# Patient Record
Sex: Female | Born: 1955
Health system: Southern US, Community
[De-identification: ages and names within clinical notes are randomized; demographics above are authoritative.]

## PROBLEM LIST (undated history)

## (undated) DIAGNOSIS — I10 Essential (primary) hypertension: Secondary | ICD-10-CM

## (undated) DIAGNOSIS — K449 Diaphragmatic hernia without obstruction or gangrene: Secondary | ICD-10-CM

## (undated) DIAGNOSIS — K219 Gastro-esophageal reflux disease without esophagitis: Secondary | ICD-10-CM

## (undated) DIAGNOSIS — E119 Type 2 diabetes mellitus without complications: Secondary | ICD-10-CM

## (undated) DIAGNOSIS — N289 Disorder of kidney and ureter, unspecified: Secondary | ICD-10-CM

## (undated) DIAGNOSIS — E785 Hyperlipidemia, unspecified: Secondary | ICD-10-CM

## (undated) HISTORY — DX: Diaphragmatic hernia without obstruction or gangrene: K44.9

## (undated) HISTORY — PX: CHOLECYSTECTOMY: SHX55

## (undated) HISTORY — DX: Hyperlipidemia, unspecified: E78.5

---

## 2004-05-03 ENCOUNTER — Inpatient Hospital Stay (HOSPITAL_COMMUNITY): Admission: AD | Admit: 2004-05-03 | Discharge: 2004-05-19 | Payer: Self-pay | Admitting: *Deleted

## 2004-05-03 ENCOUNTER — Emergency Department (HOSPITAL_COMMUNITY): Admission: EM | Admit: 2004-05-03 | Discharge: 2004-05-03 | Payer: Self-pay | Admitting: Family Medicine

## 2004-06-21 ENCOUNTER — Encounter: Admission: RE | Admit: 2004-06-21 | Discharge: 2004-09-19 | Payer: Self-pay | Admitting: Family Medicine

## 2005-06-04 ENCOUNTER — Emergency Department (HOSPITAL_COMMUNITY): Admission: EM | Admit: 2005-06-04 | Discharge: 2005-06-04 | Payer: Self-pay | Admitting: Family Medicine

## 2005-11-29 ENCOUNTER — Ambulatory Visit (HOSPITAL_BASED_OUTPATIENT_CLINIC_OR_DEPARTMENT_OTHER): Admission: RE | Admit: 2005-11-29 | Discharge: 2005-11-29 | Payer: Self-pay | Admitting: Orthopedic Surgery

## 2005-11-29 ENCOUNTER — Encounter (INDEPENDENT_AMBULATORY_CARE_PROVIDER_SITE_OTHER): Payer: Self-pay | Admitting: Specialist

## 2006-05-22 ENCOUNTER — Encounter: Admission: RE | Admit: 2006-05-22 | Discharge: 2006-05-22 | Payer: Self-pay | Admitting: Orthopedic Surgery

## 2006-06-08 ENCOUNTER — Ambulatory Visit: Payer: Self-pay | Admitting: Internal Medicine

## 2006-06-08 ENCOUNTER — Encounter (INDEPENDENT_AMBULATORY_CARE_PROVIDER_SITE_OTHER): Payer: Self-pay | Admitting: Specialist

## 2006-06-09 ENCOUNTER — Inpatient Hospital Stay (HOSPITAL_COMMUNITY): Admission: RE | Admit: 2006-06-09 | Discharge: 2006-06-12 | Payer: Self-pay | Admitting: Orthopedic Surgery

## 2006-07-03 ENCOUNTER — Telehealth: Payer: Self-pay | Admitting: Infectious Diseases

## 2006-07-10 ENCOUNTER — Ambulatory Visit: Payer: Self-pay | Admitting: Infectious Diseases

## 2006-07-10 LAB — CONVERTED CEMR LAB
ALT: 11 units/L (ref 0–35)
AST: 12 units/L (ref 0–37)
Albumin: 4.2 g/dL (ref 3.5–5.2)
Alkaline Phosphatase: 46 units/L (ref 39–117)
BUN: 14 mg/dL (ref 6–23)
Basophils Absolute: 0 10*3/uL (ref 0.0–0.1)
Basophils Relative: 0 % (ref 0–1)
CO2: 23 meq/L (ref 19–32)
Calcium: 9.9 mg/dL (ref 8.4–10.5)
Chloride: 101 meq/L (ref 96–112)
Creatinine, Ser: 1.01 mg/dL (ref 0.40–1.20)
Eosinophils Absolute: 0.3 10*3/uL (ref 0.0–0.7)
Eosinophils Relative: 4 % (ref 0–5)
Glucose, Bld: 85 mg/dL (ref 70–99)
HCT: 36.9 % (ref 36.0–46.0)
Hemoglobin: 11.9 g/dL — ABNORMAL LOW (ref 12.0–15.0)
Lymphocytes Relative: 50 % — ABNORMAL HIGH (ref 12–46)
Lymphs Abs: 3.4 10*3/uL — ABNORMAL HIGH (ref 0.7–3.3)
MCHC: 32.2 g/dL (ref 30.0–36.0)
MCV: 85 fL (ref 78.0–100.0)
Monocytes Absolute: 0.5 10*3/uL (ref 0.2–0.7)
Monocytes Relative: 8 % (ref 3–11)
Neutro Abs: 2.6 10*3/uL (ref 1.7–7.7)
Neutrophils Relative %: 38 % — ABNORMAL LOW (ref 43–77)
Platelets: 378 10*3/uL (ref 150–400)
Potassium: 4.6 meq/L (ref 3.5–5.3)
RBC: 4.34 M/uL (ref 3.87–5.11)
RDW: 14.7 % — ABNORMAL HIGH (ref 11.5–14.0)
Sed Rate: 36 mm/hr — ABNORMAL HIGH (ref 0–22)
Sodium: 138 meq/L (ref 135–145)
Total Bilirubin: 0.2 mg/dL — ABNORMAL LOW (ref 0.3–1.2)
Total Protein: 7.9 g/dL (ref 6.0–8.3)
WBC: 6.8 10*3/uL (ref 4.0–10.5)

## 2006-07-26 ENCOUNTER — Encounter (INDEPENDENT_AMBULATORY_CARE_PROVIDER_SITE_OTHER): Payer: Self-pay | Admitting: Infectious Diseases

## 2006-08-17 ENCOUNTER — Encounter: Admission: RE | Admit: 2006-08-17 | Discharge: 2006-10-19 | Payer: Self-pay | Admitting: Orthopedic Surgery

## 2006-08-23 DIAGNOSIS — E119 Type 2 diabetes mellitus without complications: Secondary | ICD-10-CM | POA: Insufficient documentation

## 2006-08-23 DIAGNOSIS — I10 Essential (primary) hypertension: Secondary | ICD-10-CM | POA: Insufficient documentation

## 2006-08-23 DIAGNOSIS — T8450XA Infection and inflammatory reaction due to unspecified internal joint prosthesis, initial encounter: Secondary | ICD-10-CM | POA: Insufficient documentation

## 2006-08-27 ENCOUNTER — Ambulatory Visit: Payer: Self-pay | Admitting: Infectious Diseases

## 2006-09-10 ENCOUNTER — Other Ambulatory Visit: Admission: RE | Admit: 2006-09-10 | Discharge: 2006-09-10 | Payer: Self-pay | Admitting: Family Medicine

## 2006-09-11 ENCOUNTER — Encounter (INDEPENDENT_AMBULATORY_CARE_PROVIDER_SITE_OTHER): Payer: Self-pay | Admitting: Infectious Diseases

## 2006-09-12 ENCOUNTER — Encounter: Admission: RE | Admit: 2006-09-12 | Discharge: 2006-09-12 | Payer: Self-pay | Admitting: Dermatology

## 2006-09-25 ENCOUNTER — Encounter (INDEPENDENT_AMBULATORY_CARE_PROVIDER_SITE_OTHER): Payer: Self-pay | Admitting: Infectious Diseases

## 2006-10-10 ENCOUNTER — Ambulatory Visit: Payer: Self-pay | Admitting: Infectious Diseases

## 2006-10-10 LAB — CONVERTED CEMR LAB
ALT: 10 units/L (ref 0–35)
AST: 9 units/L (ref 0–37)
Albumin: 3.8 g/dL (ref 3.5–5.2)
Alkaline Phosphatase: 52 units/L (ref 39–117)
BUN: 16 mg/dL (ref 6–23)
CO2: 23 meq/L (ref 19–32)
Calcium: 9 mg/dL (ref 8.4–10.5)
Chloride: 105 meq/L (ref 96–112)
Creatinine, Ser: 0.93 mg/dL (ref 0.40–1.20)
Glucose, Bld: 109 mg/dL — ABNORMAL HIGH (ref 70–99)
Potassium: 4.4 meq/L (ref 3.5–5.3)
Sed Rate: 60 mm/hr — ABNORMAL HIGH (ref 0–22)
Sodium: 139 meq/L (ref 135–145)
Total Bilirubin: 0.2 mg/dL — ABNORMAL LOW (ref 0.3–1.2)
Total Protein: 7 g/dL (ref 6.0–8.3)

## 2006-10-17 ENCOUNTER — Ambulatory Visit: Payer: Self-pay | Admitting: Infectious Diseases

## 2006-10-30 HISTORY — PX: OTHER SURGICAL HISTORY: SHX169

## 2006-11-20 ENCOUNTER — Ambulatory Visit (HOSPITAL_COMMUNITY): Admission: RE | Admit: 2006-11-20 | Discharge: 2006-11-20 | Payer: Self-pay | Admitting: General Surgery

## 2007-01-21 ENCOUNTER — Ambulatory Visit: Payer: Self-pay | Admitting: Internal Medicine

## 2007-01-21 LAB — CONVERTED CEMR LAB
ALT: 11 units/L (ref 0–35)
AST: 13 units/L (ref 0–37)
Albumin: 4.3 g/dL (ref 3.5–5.2)
Alkaline Phosphatase: 48 units/L (ref 39–117)
BUN: 22 mg/dL (ref 6–23)
Basophils Absolute: 0 10*3/uL (ref 0.0–0.1)
Basophils Relative: 0 % (ref 0–1)
CO2: 23 meq/L (ref 19–32)
Calcium: 9.6 mg/dL (ref 8.4–10.5)
Chloride: 103 meq/L (ref 96–112)
Creatinine, Ser: 1.14 mg/dL (ref 0.40–1.20)
Eosinophils Absolute: 0.3 10*3/uL (ref 0.0–0.7)
Eosinophils Relative: 5 % (ref 0–5)
Glucose, Bld: 126 mg/dL — ABNORMAL HIGH (ref 70–99)
HCT: 36.7 % (ref 36.0–46.0)
Hemoglobin: 11.8 g/dL — ABNORMAL LOW (ref 12.0–15.0)
Lymphocytes Relative: 50 % — ABNORMAL HIGH (ref 12–46)
Lymphs Abs: 2.9 10*3/uL (ref 0.7–3.3)
MCHC: 32.2 g/dL (ref 30.0–36.0)
MCV: 86.8 fL (ref 78.0–100.0)
Monocytes Absolute: 0.5 10*3/uL (ref 0.2–0.7)
Monocytes Relative: 8 % (ref 3–11)
Neutro Abs: 2.2 10*3/uL (ref 1.7–7.7)
Neutrophils Relative %: 38 % — ABNORMAL LOW (ref 43–77)
Platelets: 334 10*3/uL (ref 150–400)
Potassium: 4.2 meq/L (ref 3.5–5.3)
RBC: 4.23 M/uL (ref 3.87–5.11)
RDW: 15.7 % — ABNORMAL HIGH (ref 11.5–14.0)
Sed Rate: 13 mm/hr (ref 0–22)
Sodium: 139 meq/L (ref 135–145)
Total Bilirubin: 0.2 mg/dL — ABNORMAL LOW (ref 0.3–1.2)
Total Protein: 7.5 g/dL (ref 6.0–8.3)
WBC: 5.9 10*3/uL (ref 4.0–10.5)

## 2007-02-05 ENCOUNTER — Ambulatory Visit: Payer: Self-pay | Admitting: Internal Medicine

## 2007-02-05 ENCOUNTER — Ambulatory Visit (HOSPITAL_COMMUNITY): Admission: RE | Admit: 2007-02-05 | Discharge: 2007-02-05 | Payer: Self-pay | Admitting: Internal Medicine

## 2007-03-14 ENCOUNTER — Ambulatory Visit: Payer: Self-pay | Admitting: Internal Medicine

## 2007-03-25 ENCOUNTER — Encounter: Admission: RE | Admit: 2007-03-25 | Discharge: 2007-05-01 | Payer: Self-pay | Admitting: Orthopedic Surgery

## 2007-05-15 ENCOUNTER — Encounter: Admission: RE | Admit: 2007-05-15 | Discharge: 2007-06-17 | Payer: Self-pay | Admitting: Neurology

## 2007-06-12 ENCOUNTER — Ambulatory Visit: Payer: Self-pay | Admitting: Internal Medicine

## 2007-06-12 LAB — CONVERTED CEMR LAB: Sed Rate: 13 mm/hr (ref 0–22)

## 2007-06-18 ENCOUNTER — Ambulatory Visit: Payer: Self-pay | Admitting: Internal Medicine

## 2007-06-30 HISTORY — PX: ORIF TIBIA PLATEAU: SHX2132

## 2007-07-06 ENCOUNTER — Inpatient Hospital Stay (HOSPITAL_COMMUNITY): Admission: EM | Admit: 2007-07-06 | Discharge: 2007-07-14 | Payer: Self-pay | Admitting: Emergency Medicine

## 2007-11-26 ENCOUNTER — Encounter: Admission: RE | Admit: 2007-11-26 | Discharge: 2008-02-12 | Payer: Self-pay | Admitting: Orthopedic Surgery

## 2008-01-07 ENCOUNTER — Encounter: Admission: RE | Admit: 2008-01-07 | Discharge: 2008-01-07 | Payer: Self-pay | Admitting: Family Medicine

## 2008-01-20 ENCOUNTER — Other Ambulatory Visit: Admission: RE | Admit: 2008-01-20 | Discharge: 2008-01-20 | Payer: Self-pay | Admitting: Family Medicine

## 2009-01-11 ENCOUNTER — Encounter: Admission: RE | Admit: 2009-01-11 | Discharge: 2009-01-11 | Payer: Self-pay | Admitting: Family Medicine

## 2009-01-18 ENCOUNTER — Other Ambulatory Visit: Admission: RE | Admit: 2009-01-18 | Discharge: 2009-01-18 | Payer: Self-pay | Admitting: Family Medicine

## 2010-01-10 ENCOUNTER — Emergency Department (HOSPITAL_COMMUNITY): Admission: EM | Admit: 2010-01-10 | Discharge: 2010-01-10 | Payer: Self-pay | Admitting: Emergency Medicine

## 2010-01-25 ENCOUNTER — Encounter: Admission: RE | Admit: 2010-01-25 | Discharge: 2010-01-25 | Payer: Self-pay | Admitting: Family Medicine

## 2010-02-14 ENCOUNTER — Encounter
Admission: RE | Admit: 2010-02-14 | Discharge: 2010-03-31 | Payer: Self-pay | Source: Home / Self Care | Admitting: Sports Medicine

## 2010-05-21 ENCOUNTER — Encounter: Payer: Self-pay | Admitting: *Deleted
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
## 2010-05-31 NOTE — Consult Note (Signed)
Summary: Consultation Report  Consultation Report   Imported By: Randon Goldsmith 10/23/2006 11:17:10  _____________________________________________________________________  External Attachment:    Type:   Image     Comment:   External Document

## 2010-05-31 NOTE — Progress Notes (Signed)
Summary: OV  OV   Imported By: Dorice Lamas 09/17/2006 12:42:16  _____________________________________________________________________  External Attachment:    Type:   Image     Comment:   External Document
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
## 2010-05-31 NOTE — Assessment & Plan Note (Signed)
Summary: FU OV/VS   Chief Complaint:  f/u R ankle infection.[name] Houston  History of Present Illness: [name] Houston continues to have some soreness in her ankle and is doing PT but has not had any new swelling, redness or pain. She continues on Keflex without difficulty.  Current Allergies (reviewed today): No known allergies       Vital Signs:  Patient Profile:   55 Years Old Female Weight:      211.1 pounds (95.95 kg) Temp:     97.4 degrees F (36.33 degrees C) oral Pulse rate:   74 / minute BP sitting:   130 / 80  (right arm)  Pt. in pain?   no              Is Patient Diabetic? Yes  Nutritional Status BMI of 25 - 29 = overweight  Does patient need assistance? Functional Status Self care Ambulation Normal     Physical Exam  Extremities:     Her R ankle has a stable scar laterally without any drainage or signs of active infection.    Impression & Recommendations:  Problem # 1:  INFECTION DUE TO INTERNAL JOINT PROSTHESIS (ICD-996.66) Her sed rate in September had normalized to 12. I suspect she is cured but the only real test of cure is to stop Keflex and see how she does. She agrees to stop and knows to call me if she has any new signs of recurrent infection. Orders: Est. Patient Level III (95284)    Patient Instructions: 1)  Please schedule a follow-up appointment in 2 months. 2)  Stop taking the antibiotic .    ]
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
## 2010-05-31 NOTE — Progress Notes (Signed)
  Phone Note Call from Patient   Reason for Call: Talk to Nurse Summary of Call: Has OV March 11 with Dr Ninetta Lights . She has wound vac present and wanted to know would we be replacing the dressing after the doctor finishes.   Advised we will apply a wet to dry dressing and the home health nurse will need to apply wound vacc. The [facility] is scheduled for OV the next morning. Initial call taken by: Tomasita Morrow RN,  July 03, 2006 2:26 PM

## 2010-05-31 NOTE — Progress Notes (Signed)
Summary: Initial OV  -  07/10/2006  Initial OV  -  07/10/2006   Imported By: Olena Leatherwood 07/12/2006 10:36:59  _____________________________________________________________________  External Attachment:    Type:   Image     Comment:   External Document

## 2010-05-31 NOTE — Assessment & Plan Note (Signed)
Summary: F/U FROM 07/10/06 VISIT/KDW   Chief Complaint:  f/u ov r lateral ankle .  Current Allergies: No known allergies     Risk Factors: Tobacco use:  current    Vital Signs:  Patient Profile:   55 Years Old Female Weight:      198.6 pounds (90.27 kg) Temp:     97 degrees F (36.11 degrees C) oral Pulse (ortho):   86 / minute BP sitting:   163 / 97  (left arm) Cuff size:   regular  Vitals Entered By: Tomasita Morrow RN (August 27, 2006 10:32 AM)             Is Patient Diabetic? No     Impression & Recommendations:  Problem # 1:  INFECTION DUE TO INTERNAL JOINT PROSTHESIS (WUJ-811.91) Assessment: Improved  Orders: Est. Patient Level II (47829)   Medications Added to Medication List This Visit: 1)  Keflex 500 Mg Caps (Cephalexin) .... One twice daily   Please see scanned dictation [phone]

## 2010-05-31 NOTE — Progress Notes (Signed)
Summary: Office Visit  Office Visit   Imported By: Randon Goldsmith 10/23/2006 10:35:53  _____________________________________________________________________  External Attachment:    Type:   Image     Comment:   External Document

## 2010-05-31 NOTE — Letter (Signed)
Summary: [facility], 07/26/2006  [facility], 07/26/2006   Imported By: Olena Leatherwood 08/02/2006 10:43:12  _____________________________________________________________________  External Attachment:    Type:   Image     Comment:   External Document

## 2010-05-31 NOTE — Assessment & Plan Note (Signed)
Summary: kdw   History of Present Illness: [name] Houston is in for her routine follow-up visit.  I saw her when she was hospitalized in February of this year.  Her current problem began in early 2007, when she slipped on some wet grass and sustained a closed right fibular fracture.  She underwent an uneventful surgery where internal fixation hardware was placed.  Postoperatively, she developed a draining sinus over her lateral malleolus.  Dr. Kristeen Miss took her back to surgery in August of 2007 and removed all hardware.  Operative cultures grew multiple organisms.  She was treated with Augmentin, but only took it for two weeks postoperatively and continued to have drainage.  She was readmitted to the hospital in February of this year.  Cultures again grew multiple organisms.  She was treated with Augmentin postoperatively, and took that until April 28 when my partner, Dr. Burnice Logan, switch her to Keflex.  She has been on Keflex ever since.  She has not had any further drainage, but continues to have some tenderness laterally over the ankle.  Her sed rate in March was 36.  It went up to 60 in June, but was down to normal at 13 on September 22.  Current Allergies: No known allergies     Risk Factors: Tobacco use:  current    Vital Signs:  Patient Profile:   55 Years Old Female Weight:      207.50 pounds Temp:     99.5 degrees F oral Pulse rate:   77 / minute BP sitting:   122 / 85             Is Patient Diabetic? Yes   Does patient need assistance? Functional Status Self care Ambulation Normal     Physical Exam  General:     alert and well-nourished.   Extremities:     there is a healed scar over the right lateral malleolus.  There is one small area of central scabbing.  There is no drainage, fluctuance, or other signs of active infection.    Impression & Recommendations:  Problem # 1:  INFECTION DUE TO INTERNAL JOINT PROSTHESIS (ICD-996.66) Her osteomyelitis  probably is cured at this point.  I will repeat plain x-rays and see her back in one month to consider stopping antibiotics. Orders: Est. Patient Level III (89381) Diagnostic X-Ray/Fluoroscopy (Diagnostic X-Ray/Flu)    Patient Instructions: 1)  Please schedule a follow-up appointment in 1 month.    ]

## 2010-05-31 NOTE — Letter (Signed)
Summary: [facility]-Dr. Madelon Lips  [facility]-Dr. Caffrey   Imported By: Dorice Lamas 10/02/2006 10:02:33  _____________________________________________________________________  External Attachment:    Type:   Image     Comment:   External Document

## 2010-09-13 NOTE — Discharge Summary (Signed)
NAME:  [name] Houston, [name] Houston NO.:  [id number]   MEDICAL RECORD NO.:  [id number]          PATIENT TYPE:  INP   LOCATION:  5031                         FACILITY:  [facility]   PHYSICIAN:  Eulas Post, MD    DATE OF BIRTH:  20-Feb-1956   DATE OF ADMISSION:  07/06/2007  DATE OF DISCHARGE:                               DISCHARGE SUMMARY   Date of anticipated discharge is July 14, 2007.   ADMISSION DIAGNOSIS:  Left knee pain.   DISCHARGE DIAGNOSES:  1. Left proximal bicondylar tibial plateau fracture.  2. Diabetes.  3. History of recent right ankle infection status post open reduction      and internal fixation requiring multiple surgical debridements and      wound vacuum-assisted closure.  4. Hypertension.  5. Hypercholesterolemia.  6. Acute Urinary Tract Infection   DISCHARGE MEDICATIONS:  1. Metformin 850 mg 2 tabs in the morning and 1 in the evening.  2. Aspirin 325 mg p.o. daily, after 4 weeks she can go back to her      baby aspirin.  3. Glimepiride 2 mg p.o. daily.  4. Hydrochlorothiazide 25 mg p.o. daily.  5. Lisinopril 20 mg p.o. b.i.d.  6. Meloxicam 7.5 mg p.o. daily.  7. Pravastatin 40 mg p.o. b.i.d.  8. Actos 30 mg p.o. daily.  9. Percocet 5/325 one to two tabs p.o. q.6 h. p.r.n. pain, dispensing      100 with no refills.  10.OxyContin ER 10 mg p.o. b.i.d., dispensing 60 with no refills.  11.Cipro 250 mg po b.i.d., dispensing 10 with no refills.   PRIMARY PROCEDURE:  Left proximal tibial bicondylar plateau open  reduction and internal fixation.   DISCHARGE INSTRUCTIONS:  She is to be nonweightbearing, and she is in a  cast that was applied on July 12, 2007.   HOSPITAL COURSE:  Mrs. [name] Houston is a 55 year old woman who fell  and had a bicondylar tibial plateau fracture.  She elected to undergo  the above-named procedures.  She tolerated the procedures well, and  postoperatively did not have any complications.  She was given aspirin  and  sequential compression devices for DVT prophylaxis.  We did not do  Coumadin or Lovenox for fear of getting a hematoma which would increase  her risk of infection.  She was already at high risk given her diabetes  and her history of a recent ankle ORIF infection.  She was also given  perioperative and preoperative vancomycin.  She is planned to be  discharged home on cipro for the treatment of her UTI.  She has cleared  physical therapy and her pain is controlled on p.o. analgesics.  Her  dressings  were changed on postoperative day 3, and her wounds were clean, dry, and  intact, and there was no evidence of infection.  She had xrays that  demonstrated appropriate position of the hardware and adequate reduction  of the articular surface.  She was placed in a long leg cast.  She will  have home health and physical therapy at home.      Eulas Post, MD  Electronically Signed     JPL/MEDQ  D:  07/12/2007  T:  07/12/2007  Job:  [id number]

## 2010-09-13 NOTE — Op Note (Signed)
NAME:  [name] Houston, [name] Houston             ACCOUNT NO.:  [id number]   MEDICAL RECORD NO.:  [id number]          PATIENT TYPE:  AMB   LOCATION:  DAY                          [facility]ACILITY:  [facility]   PHYSICIAN:  Adolph Pollack, M.D.DATE OF BIRTH:  05-03-1955   DATE OF PROCEDURE:  11/20/2006  DATE OF DISCHARGE:                               OPERATIVE REPORT   PREOPERATIVE DIAGNOSIS:  Bilateral breast abscesses.   POSTOPERATIVE DIAGNOSIS:  Bilateral breast abscesses.   PROCEDURE:  1. Complex incision and drainage of left breast abscess.  2. Complex incision and drainage of right breast abscess.   SURGEON:  Adolph Pollack, M.D.   ANESTHESIA:  General.   INDICATIONS:  [name] Houston is a 55 year old female who has been having  treatment for a chronic left breast infection/abscess for a number of  months now.  Because it was not resolving, she was sent to me for  evaluation.  When she came to the office, she also reported the acute  onset of the right breast abscess.  Both of these were in the medial  position.  I saw her in the office yesterday and now she is brought to  the operating room urgently for drainage under general anesthesia.  She  has been on chronic Keflex for an infection following ORIF of right  ankle fracture.   TECHNIQUE:  She is seen in the holding area and brought to the operating  room, placed supine on the operating table and general anesthetic was  administered.  The breasts were sterilely prepped and draped.  Beginning  on the left side and made elliptical incision medially about the 8 to 9  o'clock position of full-thickness skin and purulent drainage was  encountered and cultured.  I then debrided some of the underlying tissue  and controlled the bleeding with electrocautery.  I packed a dry sponge  in this wound.   I then approached the right side and isolated the fluctuant area  medially in the right breast and made an elliptical incision around this  and  drained a large amount of purulent material which was sent for  culture.  I debrided the skin and subcutaneous tissue sharply.  I  controlled bleeding with electrocautery.   I then packed both wounds with saline dampened gauze and a bulky dry  dressing was applied.   She tolerated the procedure well without any apparent complications and  was taken to recovery room in satisfactory condition.      Adolph Pollack, M.D.  Electronically Signed     TJR/MEDQ  D:  11/20/2006  T:  11/20/2006  Job:  [id number]

## 2010-09-13 NOTE — H&P (Signed)
NAME:  [name] Houston, [name] Houston NO.:  [id number]   MEDICAL RECORD NO.:  [id number]          PATIENT TYPE:  INP   LOCATION:  5031                         FACILITY:  [facility]   PHYSICIAN:  Eulas Post, MD    DATE OF BIRTH:  [birth year]   DATE OF ADMISSION:  07/06/2007  DATE OF DISCHARGE:                              HISTORY & PHYSICAL   CHIEF COMPLAINT:  Left knee pain.   HISTORY:  [name] is a 55 year old African American woman  with diabetes and tobacco abuse who recently had a right ankle infection  after an open reduction internal fixation who just finished her  antibiotics, and she was walking at her friend's driveway today when she  fell down onto her left side.  She had the acute onset of severe left  knee pain and was unable to walk.  She says bearing weight makes it  worse.  Pain medications make it better.  She noted acute onset swelling  and pain.  She rates the pain as a 9/10 with any kind of motion.   REVIEW OF SYSTEMS:  She denies any recent weight changes, fevers,  chills, cough, nausea or vomiting.  She denies any changes in her vision  or her hearing.  She has no changes in her bowel or bladder habits.  No  recent chest pain or shortness of breath.  She denies any  musculoskeletal problems with the exception of the right ankle and now  the left knee.  She denies easy bruising.  She denies psychiatric  problems.  She reports endocrine problems as above with the diabetes.  She reports recent infections and immune problems as above.  She denies  any recent rashes.   PAST MEDICAL HISTORY:  Significant for:  1. Diabetes.  2. Obesity.  3. Tobacco abuse.  4. Hypertension.   PAST SURGICAL HISTORY:  Significant for right ankle open reduction  internal fixation.  I believe there was a total of 3 operations,  ultimately, and including a wound vac in order to get the infection  cleared.   MEDICATIONS:  1. Metformin 850 mg, two in the morning and  one at night.  2. Aspirin 81 mg daily.  3. Glimepiride 2 mg p.o. daily.  4. Hydrochlorothiazide 25 mg p.o. daily.  5. Lisinopril 20 mg p.o. daily.  6. Meloxicam 7.5 mg p.o. daily.  7. Pravastatin 40 mg p.o. daily.  8. Actos 30 mg p.o. daily.   FAMILY HISTORY:  Her mother had diabetes.   SOCIAL HISTORY:  She does smoke.   EXAM:  Today, she is lying in bed in no acute distress.  VITAL SIGNS:  Show a temperature of 97.2, a blood pressure 161/88, a  pulse of 90 and respirations of 16.  NECK EXAM:  She has full range of motion with no radiculopathy.  She has  a midline trachea, no tenderness.  She has no masses.  CARDIOVASCULAR EXAM:  She has trace edema distally.  She has a regular  rate and rhythm.  PULMONARY EXAM:  She has no increase in respiratory efforts.  She has no  cyanosis.  ABDOMINAL EXAM:  She is moderately obese and her abdomen soft,  nontender, nondistended with no masses or organomegaly.  LYMPHATIC EXAM:  Her neck and axilla are without lymphadenopathy.  PSYCHIATRIC EXAM:  Her mood and affect are normal and her judgment and  insight are intact.  She is competent for consent.  NEUROLOGIC EXAM:  She has sensation intact distally in her lower  extremities.  Deep tendon reflex on the right knee is intact.  Could not  be tested on the left knee due to pain.  MUSCULOSKELETAL EXAM:  She is unable to walk.  She, therefore, has an  abnormal gait.  She has normal nails and digits.  RIGHT LOWER EXTREMITY:  Has a well-healed surgical scar that is somewhat  abnormal on the right ankle.  There is no erythema or evidence of  infection.  There is decreased range of motion, but it appears to be  functional.  The ankle feels stable.  She has 5/5 strength in her EHL  and FHL.  The left lower extremity has significant swelling around the  knee.  She has a positive effusion.  She has pain to palpation  proximally along the tibia.  Her range of motion at her knee is unable  to be assessed  due to pain.  The knee is unstable due to her fracture.  Her strength also is intact at her ankle, but cannot be assessed at her  knee.  I have reviewed x-rays dated October 7, of her right ankle, which  demonstrate a widened syndesmosis and abnormal tibiotalar congruency in  the absence of any hardware.  I have also reviewed x-rays of her left  knee dated July 06, 2007, which demonstrated displaced lateral tibial  plateau fracture.  I have also reviewed her CT scan which demonstrates  comminution and lateral tibial plateau fracture with some extension to  the medial side, however, the majority of the injury is on the lateral  side and the medial side appears to be congruent with the femoral  condyle while the lateral side has depression.   IMPRESSION:  1. Left depressed lateral tibial plateau fracture that does extend      medially.  2. Tobacco abuse.  3. Diabetes mellitus.  4. Obesity.  5. History of recent ankle infection status post open reduction      internal fixation.   PLAN:  We are going to admit her for preoperative IV antibiotics as well  as preoperative optimization for an ORIF of her left lateral tibial  plateau.  The risks, benefits and alternatives to operative intervention  were discussed with her including, but not limited to, the risks of  infection, bleeding, nerve injury, cardiopulmonary complications, blood  clots, malunion, nonunion, posttraumatic stiffness and arthritis,  disease transmission, the use of allograft bone, among others, and she  is willing to proceed.  We are going to plan to manage her pain with IV  and p.o. pain medicines and give her preoperative vancomycin given her  history of infection and ongoing inpatient medical treatment.  She is at  fairly high risk for poor outcome, but we will do our best to manage her  in the best way possible.      Eulas Post, MD  Electronically Signed     JPL/MEDQ  D:  07/06/2007  T:  07/06/2007  Job:   [id number]

## 2010-09-13 NOTE — Discharge Summary (Signed)
NAME:  STEFHANIE, KACHMAR NO.:  [id number]   MEDICAL RECORD NO.:  [id number]          PATIENT TYPE:  INP   LOCATION:  5031                         FACILITY:  [facility]   PHYSICIAN:  Eulas Post, MD    DATE OF BIRTH:  Sep 25, 1955   DATE OF ADMISSION:  07/06/2007  DATE OF DISCHARGE:  07/14/2007                               DISCHARGE SUMMARY   ADDENDUM:   ADDITIONAL DISCHARGE DIAGNOSIS:  Urinary tract infection.   HOSPITAL COURSE:  Since the previous discharge summary dictated on July 12, 2007, she had some difficulty with physical therapy and remained in  the hospital for an additional two days. During that time, her  urinalysis came back and grew out greater than 100,000 colonies of  Citrobacter Koseri that was sensitive to Ciprofloxacin and less  sensitive to Cephazolin. On exam the day of discharge she says that she  had no dysuria and no fevers or chills, and she was moving her toes. She  cast was intact. We are going to plan discharge for home with  Ciprofloxacin rather than Keflex and plan to see her back in two weeks.      Eulas Post, MD  Electronically Signed     JPL/MEDQ  D:  07/14/2007  T:  07/14/2007  Job:  [id number]

## 2010-09-13 NOTE — Op Note (Signed)
NAME:  ODETTA, FORNESS NO.:  [id number]   MEDICAL RECORD NO.:  [id number]          PATIENT TYPE:  INP   LOCATION:  5031                         FACILITY:  [facility]   PHYSICIAN:  Eulas Post, MD    DATE OF BIRTH:  [birth year]   DATE OF PROCEDURE:  07/07/2007  DATE OF DISCHARGE:                               OPERATIVE REPORT   PREOPERATIVE DIAGNOSIS:  Left proximal tibia medial and lateral plateau  fracture.   POSTOPERATIVE DIAGNOSIS:  Left proximal tibia medial and lateral plateau  fracture.   OPERATIVE PROCEDURE:  Open reduction internal fixation of the left  medial and lateral proximal tibial plateaus.   ANESTHESIA:  General.   ESTIMATED BLOOD LOSS:  200 mL.   TOURNIQUET TIME:  0 minute.   ANTIBIOTICS:  Vancomycin given preoperatively.   OPERATIVE IMPLANT:  Synthes 3.5 mm proximal tibial plate with a single  non locking screw distally and multiple locking screws proximally.   PREOPERATIVE INDICATIONS:  Mrs. Manuela Halbur is a 55 year old  diabetic woman who has a recent history of an ankle fracture that was  complicated by postoperative infection who finally got over her ankle  infection and one month later twisted and fell on her left knee.  She  had the acute onset of pain, unable to bear weight.  She had x-rays and  CT scans which demonstrated a medial and lateral proximal tibial plateau  fracture.  She elected to undergo the above named procedures.  The  risks, benefits and alternatives to operative intervention were  discussed with her preoperatively including but not limited to the risks  of infection, bleeding, nerve injury, disease transmission from the use  of allograft bone, hardware prominence, hardware failure, malunion, post-  traumatic arthritis, knee stiffness, loss of function, loss of motion,  cardiopulmonary complications, blood clots, among others and she is  willing to proceed.   OPERATIVE FINDINGS:  The medial plateau was  impacted in slight varus.  We were able to elevate it from the lateral side using a transosseous  approach using a Cobb elevator.  The lateral plateau was split and there  was an anterior portion of it that was basically crushed.  We were able  to elevate the lateral plateau and replaced the bone loss with allograft  bone.  We also placed two Ethibond stitches through the lateral meniscus  and secured it back to the plate.   OPERATIVE PROCEDURE:  The patient is brought to the operating room,  placed in supine position.  General anesthesia was administered.  Vancomycin had been given preoperatively.  The left lower extremity was  prepped and draped in the usual sterile fashion.  Bump was placed under  the hip.  Incision was made over the lateral aspect of the proximal  tibia and the proximal tibia was exposed.  Care was taken to protect the  lateral collateral ligament and the peroneal nerve.  Iliotibial band was  incised.  The joint was incised underneath the lateral meniscus.  Stitches were placed in the meniscus and used as retractors and the  meniscus was elevated.  The joint  surface was directly visualized.  There was significant joint depression and some comminution anteriorly.  We then elevated the fracture on the medial side from the lateral side  using a Cobb elevator.  There was a reasonably large amount of bone  loss.  We elevated the lateral side as well.  We then progressively  tamped in the allograft bone to support the plateau.  Once satisfactory  bone graft had been placed, we then reduced the fracture anatomically  along the lateral cortex and used a [employer]  clamp to secure the  fracture.  We then held it in place with a K-wire placed a 3.5 mm plate  laterally.  Anatomic fixation and realignment was achieved.  The very  distal most portion of the plate stood anterior to the tibial crest by  just a very small amount.  Satisfactory alignment was achieved so we  irrigated  the wounds copiously.  We placed the proximal rafters screws  into position.  These were placed in such a way such that when we  brought the plate into the bone distally it would correct the slight  varus deformity that was residual on the medial fracture.  We were able  to correct this varus deformity well and secure the plate distally with  a nonlocking screw followed by two locking screws and an oblique screw  going up towards the subchondral bone.  Excellent fixation was achieved.  The knee was taken through range of motion and was found to be stable.  We secured the Ethibond sutures from the meniscus through the plate and  irrigated the wounds copiously.  The tissue of the iliotibial band was  closed over the plate as was the tibialis anterior fascia.  We then  closed the subcutaneous tissue with 2-0 followed by 4-0 Monocryl for the  skin.  Steri-Strips were placed followed by sterile gauze and a knee  immobilizer.  Marcaine was injected.  The patient was awakened and  returned to PACU in stable satisfactory condition.  There were no  complications.  The patient tolerated the procedure well.      Eulas Post, MD  Electronically Signed     JPL/MEDQ  D:  07/07/2007  T:  07/08/2007  Job:  [id number]

## 2010-09-13 NOTE — Consult Note (Signed)
NAME:  [name] Houston, [name] Houston NO.:  [id number]   MEDICAL RECORD NO.:  [id number]          PATIENT TYPE:  INP   LOCATION:  5031                         FACILITY:  [facility]   PHYSICIAN:  Hollice Espy, M.D.DATE OF BIRTH:  08/01/1955   DATE OF CONSULTATION:  07/06/2007  DATE OF DISCHARGE:                                 CONSULTATION   PRIMARY CARE PHYSICIAN:  Stacie Acres. White, M.D.   CHIEF COMPLAINT:  Fall with left leg pain.   HISTORY OF PRESENT ILLNESS:  Patient is a 55 year old African-American  female with a past medical history of obesity, hypertension, diabetes  mellitus, and tobacco abuse as well as she has had three previous ankle  fracture repairs, who presents to the emergency room after a fall,  leading to a comminuted proximal left tibial fracture in two areas.  Orthopedic surgery was asked to see and admit the patient, and [facility]  Hospitalists were consulted for medical management.  When I saw the  patient, she had been transported up to the hospital floor.  She was  doing okay.  She complained of some mild leg pain but denied any  headaches, vision changes, dysphagia, chest pain, palpitations,  shortness of breath, wheezing, or coughing.  No abdominal pain, no  hematuria, dysuria, constipation, or diarrhea.  No focal extremity  numbness, weakness, or pain other than in her left thigh.  Her review of  systems is otherwise negative.   Patient tells me at her baseline, she is normally able to ambulate well.  Does not have any shortness of breath or chest pain.  Has unrestricted  activity.   Her past medical history includes diabetes mellitus, which she says is  well controlled.  Hypertension.  She has a history of right ankle  fracture repair with secondary arthritis.  History of tobacco abuse.  She also has a history of breast abscess, status post drainage.   MEDICATIONS:  1. Metformin 850 2 tabs in the morning, 1 tablet in the evening.  2. Glyburide 2  tablets p.o. daily.  3. Pravastatin 80 mg p.o. daily.  4. HCTZ 25 p.o. daily.  5. Lisinopril 20 mg p.o. b.i.d.  6. Meloxicam 7.5 p.o. daily.  7. She checks her sugars on a regular basis.  8. She takes Actos 30 mg p.o. daily.   Patient has no known drug allergies.   SOCIAL HISTORY:  She smokes about three cigarettes a day.  Denies any  heavy alcohol or drug use.   FAMILY HISTORY:  Noncontributory.   PHYSICAL EXAMINATION:  VITALS ON ADMISSION:  Temp 97, heart rate 161/88,  respirations 16, O2 sat 98% on room air, pulse 90.  GENERAL:  She is alert and oriented x3 in some mild distress secondary  to leg pain.  HEENT:  Normocephalic and atraumatic.  Mucous membranes are moist.  She has no carotid bruits.  HEART:  Regular rate and rhythm.  S1 and S2.  LUNGS:  Clear to auscultation bilaterally.  ABDOMEN:  Soft, obese, nontender, positive bowel sounds.  I defer to musculoskeletal exam secondary to her injury.  EXTREMITIES:  No clubbing, cyanosis  or edema.   LAB WORK:  White count is 13.5, H&H 11.1 and 34, MCV 88, platelet count  333, 77% neutrophils.  Sodium 137, potassium 2.6, chloride 101, bicarb  21, BUN 13, creatinine 1.05, glucose 129.  LFTs are unremarkable.   EKG shows a normal sinus rhythm.   Full review of her complete knee showed a comminuted proximal left  tibial fracture present involving both medial and lateral tibial  plateaus, large joint effusion.  No fibular or femoral fracture.   ASSESSMENT/PLAN:  1. Status post fall with comminuted tibial fracture.  Patient is okay      for the operating room.  Despite her risks, her diabetes and      hypertension, she appears to otherwise be of low cardiac risk and      should do well.  2. Hypertension:  Would refocus on pain control while she will be      n.p.o.  Will put her on an IV beta blocker.  3. Diabetes mellitus:  Will change the sliding scale for now, covering      her while she is going to the operating room.  4.  History of tobacco abuse:  She has declined a nicotine patch.  5. Obesity.      Hollice Espy, M.D.  Electronically Signed     SKK/MEDQ  D:  07/06/2007  T:  07/06/2007  Job:  [id number]   cc:   Eulas Post, MD  Stacie Acres Cliffton Asters, M.D.

## 2010-09-20 NOTE — H&P (Signed)
NAME:  [name] Houston, [name] Houston NO.:  [id number]   MEDICAL RECORD NO.:  [id number]          PATIENT TYPE:  INP   LOCATION:  5727                         FACILITY:  [facility]   PHYSICIAN:  Theone Stanley, MD   DATE OF BIRTH:  24-Aug-1955   DATE OF ADMISSION:  05/03/2004  DATE OF DISCHARGE:                                HISTORY & PHYSICAL   HISTORY OF PRESENT ILLNESS:  [name] Houston is a 55 year old African American  female, who has no primary care physician, presenting to urgent care with  complaints of right breast pain.  Apparently the patient had what appeared  to be a boil or pimple about a week ago on her right breast.  It was  itching.  She started to itch it and put some lotion on it.  Over the past  two days, it has gotten much worse with a lot more pain, more indurated and  more erythematous.  She is a non-nursing female.  Also, after presenting to  the urgent care, it was noted that she had elevated sugars in the 200s with  polyuria and polydipsia over the past two to three days.   PAST MEDICAL HISTORY:  None.   MEDICATIONS:  Aleve on a p.r.n. basis.   ALLERGIES:  None.   FAMILY HISTORY:  Paternal grandmother, father and mother all have diabetes.  Mother has hypertension.  Her father actually passed away at age 77 from  prostate cancer and PE.   SOCIAL HISTORY:  [place]  She has one older child.  She smokes about two to three  cigarettes a day.  She drinks about two to three cups of hard liquor on the  weekends.  No illicit drug use.   REVIEW OF SYSTEMS:  The review of systems besides her current right breast  pain is negative.  She denies any chest pain, shortness of breath, cough,  dizziness, lightheadedness, headaches, change in bowel habits __________  No  hematuria or dysuria.  No other skin changes.   PHYSICAL EXAMINATION:  VITAL SIGNS:  Temperature 100.5 degrees, heart rate  96, respiratory rate  18, blood pressure 180/99, saturating 100% on room air.  HEENT:  Head:  Normocephalic and atraumatic.  Eyes:  __________ Pupils equal  and reactive to light.  Extraocular movements intact.  Ears:  No discharge.  Throat:  Clear.  No erythema.  No exudate.  Mucosal moist.  NECK:  Supple.  No lymphadenopathy.  HEART:  Regular rate and rhythm.  No murmurs, rubs or gallops appreciated.  BREASTS:  Exam revealed a indurated, erythematous, quite swollen right  breast mainly on the anterior aspect of the right breast.  She did have a  small amount of skin breakdown in the 6 o'clock position, but no pus or  other fluids were present.  EXTREMITIES:  No cyanosis, clubbing or edema.   LABORATORIES:  White count of 14, hemoglobin of 13, hematocrit of 40,  platelets of 431.  Sodium at 133, potassium at 3.9, chloride at 98, CO2 at  26, glucose at 273, BUN  at 9, creatinine at 0.7, total bilirubin at 0.4,  alkaline phosphatase at 92, AST at 13, ALT at 13, total protein at 8.6,  albumin at 3.1, calcium at 9.3.  A urinalysis showed a specific gravity of  1.015, pH of 6.5, glucose of 500, negative bilirubin, ketones at 40, a large  amount of blood, 30 protein, urobilinuria at 1 and trace leukocytes.   ASSESSMENT:  [name] Houston is a 55 year old African American female  presenting with new onset diabetes and right breast mastitis.   IMPRESSION AND PLAN:  1.  Right breast mastitis.  At this point in time it is unclear whether she      has an abscess present.  Will obtain an ultrasound of the breast      tomorrow and see if we can possibly aspirate and/or consult surgery at      that point in time.  In the meantime, I will start her on Unasyn for      broad coverage and cold compresses or hot compresses for pain relief.  2.  New onset diabetes.  Since the patient has not been to a physician, it      is unclear how long the patient has had it.  Will obtain a hemoglobin      A1C to see whether this is  longstanding.  Will start her on subcutaneous      insulin and once the hemoglobin A1C is back consider starting oral      hypoglycemic medication.  3.  Hypertension.  She most likely has hypertension, although pain might be      complicating the issue.  Will start her on an ACE inhibitor at this      point in time to help out with that.  4.  Prophylaxis.  We will start her on subcutaneous Lovenox and consider H2      or PPI.  However, since she is eating will hold off on that.      Atta   AEJ/MEDQ  D:  05/03/2004  T:  05/03/2004  Job:  [id number]

## 2010-09-20 NOTE — Consult Note (Signed)
NAME:  [name] Houston, [name] Houston             ACCOUNT NO.:  [id number]   MEDICAL RECORD NO.:  [id number]          PATIENT TYPE:  INP   LOCATION:  5727                         FACILITY:  [facility]   PHYSICIAN:  Adolph Pollack, M.D.DATE OF BIRTH:  [birth year]   DATE OF CONSULTATION:  05/13/2004  DATE OF DISCHARGE:                                   CONSULTATION   REQUESTING PHYSICIAN:  Corinna L. Lendell Caprice, MD.   REASON FOR CONSULTATION:  Mastitis.   HISTORY OF PRESENT ILLNESS:  Ms. Wiatrek is a 55 year old female who was  admitted back May 03, 2004. At that time she was noted to have fairly  significant right breast mastitis.  She also had a significant hyperglycemia  and hypertension.  She has thus been diagnosed with new onset diabetes and  hypertension.  They have obtained ultrasounds seeing if there is anything to  drain in the breast and they have been negative; however, the mastitis has  persisted and now she has purulent drainage coming from the right breast and  we were thus asked to see her.  She has been on intravenous antibiotics.   PAST MEDICAL HISTORY:  Recently diagnosed diabetes mellitus, hypertension,  gallbladder disease.   PAST SURGICAL HISTORY:  Cholecystectomy.   ALLERGIES:  No known drug allergies.   CURRENT MEDICATIONS:  Insulin, glucophage, Lovenox, Lisinopril, vancomycin,  Flagyl, morphine, Vicodin p.r.n.   SOCIAL HISTORY:  She is a smoker, has weekend alcohol use.  No drug use.   FAMILY HISTORY:  No breast disease in the family.  Mother has hypertension,  father died of prostate cancer.   REVIEW OF SYSTEMS:  CARDIOVASCULAR:  No known heart disease.  PULMONARY:  No  chronic lung disease, asthma, pneumonia.        NEUROLOGIC:  No seizure  disorder.  HEMATOLOGIC:  No known bleeding disorders or anemia.  ENDOCRINE:  No thyroid disease.  Recently diagnosed diabetes.  GI:  No hiatal hernia, no  reflux, no hepatitis or jaundice.  No change in stool pattern.  GU:   No  kidney stones.   PHYSICAL EXAMINATION:  GENERAL:  An obese female who is in no acute  distress, pleasant and cooperative.  VITAL SIGNS:  She is afebrile with a temperature of 98.7.  Blood pressure is  161/93.  Pulse 74, respiratory rate is 18.  NECK:  Supple, no thyromegaly.  CHEST:  Breath sounds equal.  CARDIOVASCULAR:  Regular rate and rhythm, no murmur, no carotid bruits.  ABDOMEN:  Soft, obese, nontender, no masses.  BREASTS:  Right breast is erythematous, indurated and swollen, mostly from a  12 o'clock to 6 o'clock position with a fluctuant area at approximately the  5 o'clock position with some purulent drainage from it.  It is twice the  size of the left breast.  EXTREMITIES:  Lower extremities have 1 to 2+ edema bilaterally.   LABORATORY DATA:  White blood cell count May 10, 2004 was 9600, blood  cultures negative.   IMPRESSION:  Right breast abscess with significant surrounding cellulitis.   Also has newly diagnosed diabetes and hypertension.   PLAN:  Will  take her to the operating room for incision and drainage this  afternoon.  I discussed procedure, rationale and risks.  The risks include  but are not limited to bleeding breast deformity.  Recurrent infection and  risks of anesthesia.  She seemed to understand these and is agreeable to  proceed.  We will hold her Lovenox and make her n.p.o.      Tish Men  D:  05/13/2004  T:  05/13/2004  Job:  [id number]

## 2010-09-20 NOTE — Op Note (Signed)
NAME:  [name] Houston, [name] Houston NO.:  [id number]   MEDICAL RECORD NO.:  [id number]          PATIENT TYPE:  AMB   LOCATION:  DSC                          FACILITY:  [facility]   PHYSICIAN:  Dyke Brackett, M.D.    DATE OF BIRTH:  23-Jan-1956   DATE OF PROCEDURE:  11/29/2005  DATE OF DISCHARGE:                                 OPERATIVE REPORT   SURGEON:  Dyke Brackett, M.D.   PREOPERATIVE DIAGNOSIS:  Retained hardware, right ankle, rule out infection.   POSTOPERATIVE DIAGNOSIS:  Retained hardware, right ankle, rule out  infection.   OPERATION:  1. Incision and debridement, right ankle.  2. Removal of hardware, right ankle.   ANESTHESIA:  General with local supplementation.   INDICATIONS:  The patient is a 55 year old female, who had ORIF of her  ankle, uneventful.  She developed a problem with her lateral wound many  weeks after surgery, with recurrent mastitis being noted as well, possible  seeding of the hardware, thought to be amenable with the fracture having  healed, the hardware removal, bone biopsy, culture and I&D as an outpatient.   DESCRIPTION OF PROCEDURE:  Sterile prep and drape.  After placing the  tourniquet at 350, incision was made over the lateral plate with the  draining sinus noted.  This sinus was probably in the midportion of the  wound and measured about 0.5 cm.  It was not clear that it definitely  communicated deeply with the bone, but there was some serous drainage around  the plate, possibly more than would be thought typical.  This was cultured  aerobically and anaerobically, although no gross purulence was seen.  The  holes, once the plate and the screws had been removed, were curetted and  sent for bone biopsy.  Copious irrigation was carried out.  Closure was  effected with limited 2-0 away from the sinus site with staples, and then  one stitch was placed widely over the sinus to effect some closure of it  after the edges were debrided of the  sinus tract.  A very bulky dressing was  applied.  Taken to the recovery room in stable condition.  The tourniquet  was released after application of the dressing.  Marcaine without  epinephrine infiltrated into the wound.  Lightly compressive sterile  dressing applied with a splint.  Taken to the recovery room in stable  condition.      Dyke Brackett, M.D.  Electronically Signed     WDC/MEDQ  D:  11/29/2005  T:  11/29/2005  Job:  [id number]

## 2010-09-20 NOTE — Op Note (Signed)
NAME:  [name] Houston, [name] Houston             ACCOUNT NO.:  [id number]   MEDICAL RECORD NO.:  [id number]          PATIENT TYPE:  AMB   LOCATION:  SDS                          FACILITY:  [facility]   PHYSICIAN:  Dyke Brackett, M.D.    DATE OF BIRTH:  Aug 11, 1955   DATE OF PROCEDURE:  06/08/2006  DATE OF DISCHARGE:                               OPERATIVE REPORT   PREOPERATIVE DIAGNOSIS:  Probable osteomyelitis, distal 1/3 fibular  shaft.   POSTOPERATIVE DIAGNOSIS:  Probable osteomyelitis, distal 1/3 fibular  shaft.   OPERATION:  1. Irrigation and debridement of osteomyelitis and soft tissue.  2. Application of VAC.   SURGEON:  Dyke Brackett, M.D.   ASSISTANT:  Richardean Canal, P.A.   ANESTHESIA:  General.   TOURNIQUET TIME:  Approximately 40 minutes.   INDICATIONS:  The patient is a 55 year old diabetic, who had uneventful  ankle surgery, healed her fracture, developed a possible wound infection  or a wound pressure sore leading to some nonhealing.  The fracture  healed, but there was persistent inability to heal the wound completely,  leaving the hardware removed.  When cultured, multiple cultures were  negative, although she had been placed on antibiotics periodically due  to the wound drainage.  And due to the fact that once the hardware was  removed, the wound failed to heal completely, she had a bone scan, which  suggested osteomyelitis.  It was then thought that she needed inpatient  care with more aggressive debridement and application of a VAC, and this  could be accomplished as an outpatient.   DESCRIPTION OF PROCEDURE:  The patient's open wound probably measured  about 3 cm x 1.5 cm.  It was excised.  There was some exposed peroneal  tendon in the posterior half.  The bone was somewhat mushy.  No gross  abscess was appreciated, but cultures of the soft tissue and bone were  sent, as well as bone biopsy.  The medullary canal was debrided over a  distance of probably 2 to about 3 cm,  and soft tissue debridement  carried out as well.  Copious irrigation with pulsatile lavage, 3000 mL,  was placed, followed by application of a VAC, a sterile wrap and a  posterior splint.  The tourniquet was released.  Taken to the recovery  room in stable condition.      Dyke Brackett, M.D.  Electronically Signed     WDC/MEDQ  D:  06/08/2006  T:  06/08/2006  Job:  [id number]

## 2010-09-20 NOTE — Op Note (Signed)
NAME:  [name], [name] Houston             ACCOUNT NO.:  [id number]   MEDICAL RECORD NO.:  [id number]          PATIENT TYPE:  INP   LOCATION:  5727                         FACILITY:  [facility]   PHYSICIAN:  Adolph Pollack, M.D.DATE OF BIRTH:  [birth year]   DATE OF PROCEDURE:  05/13/2004  DATE OF DISCHARGE:                                 OPERATIVE REPORT   PREOPERATIVE DIAGNOSIS:  Right breast abscess.   POSTOPERATIVE DIAGNOSIS:  Right breast abscess.   OPERATION/PROCEDURE:  Complex incision and drainage of right breast abscess.   SURGEON:  Adolph Pollack, M.D.   ANESTHESIA:  General.   INDICATIONS:  [name] is a 55 year old recently diagnosed diabetic who  has had problems with right breast mastitis.  She states this started around  Thanksgiving.  On examination she has a very firm erythematous breast, with  two areas of fluctuance; one at the 5 o'clock position and one at the 2  o'clock position.  She now presents for drainage.   DESCRIPTION OF OPERATION:  The patient was seen in the holding area and the  right chest wall was marked.  She was then brought to the operating room,  placed supine on the operating table and general anesthetic was  administered.  The right breast was sterilely prepped and draped.  At the 5  o'clock position and incision was made in the fluctuant area and copious  amounts of purulent material evacuated.  Some was sent for culture.  It was  a deep cavity and it projected laterally, inferiorly and medially.  It also  went superiorly.  At the 2 o'clock position I incised the fluctuant area  there and more purulent material was noted.  The two cavities just did  connect.  There were some superior loculations, which I broke up with my  finger.  This was a ery complex type abscess.  I continued to break up  loculations bluntly.   I then irrigated the wound copiously with saline solution.  Hemostasis was  obtained with the cautery.  I then packed the  wound with two separate Kerlix  soaked gauze.  A bulky dressing was applied.   The patient tolerated the procedure well without any apparent complications.  She was taken to the recovery room in satisfactory condition.  She will need  to have dressing changes started I about 48 hours and my need a dressing  change in the operating room depending on how well she tolerates the  dressing removal.      TJR/MEDQ  D:  05/13/2004  T:  05/14/2004  Job:  [id number]

## 2010-09-20 NOTE — Discharge Summary (Signed)
NAME:  [name] Houston, [name] Houston             ACCOUNT NO.:  [id number]   MEDICAL RECORD NO.:  [id number]          PATIENT TYPE:  INP   LOCATION:  5727                         FACILITY:  [facility]   PHYSICIAN:  Hollice Espy, M.D.DATE OF BIRTH:  1956/04/07   DATE OF ADMISSION:  05/03/2004  DATE OF DISCHARGE:  05/19/2004                                 DISCHARGE SUMMARY   CONSULTANTS:  Dr. Avel Peace, surgery.   DISCHARGE DIAGNOSES:  1.  Mastitis, status post debridement of right breast abscess.  2.  Cellulitis of the right breast.  3.  Newly diagnosed diabetes.  4.  Newly diagnosed hypertension.  5.  Obesity.   DISCHARGE MEDICATIONS:  1.  Glucophage 500 mg one p.o. b.i.d.  2.  Amaryl 2 mg one p.o. daily.  3.  Lasix 20 mg daily.  4.  Prinivil 20 mg p.o. b.i.d.  5.  Hydrochlorothiazide 25 mg p.o. daily.  6.  Augmentin 875 mg two times a day for six more days.  7.  Benadryl 25 p.o. three times a day.  8.  Micro-Guard powder applied topically two times a day to buttocks and      thighs for ten days.  9.  The patient is also being discharged on Percocet 5/325 q. 6 hours p.r.n.      pain.   Her activity will be as tolerated.  She will be discharged on a low sodium  diabetic diet.  Her wound care will be addressed as per home health.  She  will follow-up with Dr. Abbey Chatters in one week from [facility].  She  has also been given a number to establish with a primary care doctor.   HOSPITAL COURSE:  The patient is a 55 year old African-American female who  for one week has been having problems with a right breast area of itching  which started to have a rash as well.  She felt for the last two days it was  getting much worse.  She had just been recently diagnosed with diabetes just  within the last week and had been having episodes of polyuria and  polydipsia.  She presented to the emergency room and was found to have a  questionable mastitis, questionable abscess.  The patient was  started on IV  antibiotics and admitted for further evaluation.  In addition her blood  pressure was found to be elevated with systolic blood pressure in the 150's.  The patient was started on an ACE inhibitor.  She was also started on  sliding scale for her insulin and an oral agent was added.  For mastitis and  abscess, the patient was started on fine needle aspiration.  Specimen was  attempted to be sent for culture, however, not enough sample could be drawn.  The patient was started on Unasyn and Flagyl one day right after the patient  continued to have fevers.  The patient despite these broad coverage  antibiotics continued to have fevers and surgery was consulted for a second  opinion after it was found on May 09, 2004, a second ultrasound was  attempted, the patient had a loculated  area noted.  At this time surgery was  notified and evaluated the patient and felt that she would need surgery.  She underwent an incision and debridement on May 15, 2004.  She  tolerated this well.  Postop she was sore but doing better.  She was  continued on IV antibiotics.  Cultures were found to be negative for MRSA.  The patient's antibiotics were changed over to p.o. on May 18, 2004.  She was recommended to be continued on seven more days of p.o. antibiotics.  In regard to her diabetes mellitus, the patient was placed on a sliding  scale.  Initially she received no significant medications when first  diagnosed with diabetes.  While here in the hospital, she was started on  p.o. Glucophage and Amaryl.  She tolerated this well.  Her blood sugars  stayed stable.  In regards to her hypertension, she was started on Lasix,  Prinivil and Hydrochlorothiazide.  Previously she had not been on any  medications.  Home health was set up to follow-up with the patient in  helping with her medicines as well as helping her with dressing changes.  She will follow-up with surgery in one weeks time.  In  addition the patient  has no PCP to help manage her blood pressure, diabetes or these other  illnesses.  A number had been given for the patient to establish with her  PCP.  In addition, home health will help her with diabetic management and  education.      SKK/MEDQ  D:  08/20/2004  T:  08/21/2004  Job:  [id number]   cc:   Adolph Pollack, M.D.  [address], [address]  [place]  [place] [address]

## 2010-09-20 NOTE — Discharge Summary (Signed)
NAME:  [name] Houston, [name] Houston NO.:  [id number]   MEDICAL RECORD NO.:  [id number]          PATIENT TYPE:  INP   LOCATION:  5031                         FACILITY:  [facility]   PHYSICIAN:  Dyke Brackett, M.D.    DATE OF BIRTH:  Nov 07, 1955   DATE OF ADMISSION:  06/08/2006  DATE OF DISCHARGE:  06/12/2006                               DISCHARGE SUMMARY   ADMISSION DIAGNOSES:  1. Nonhealing right distal fibula wound.  2. Asthma.  3. Hypertension.  4. Diabetes mellitus.   DISCHARGE DIAGNOSES:  1. Nonhealing right distal fibula wound, now status post removal of      hardware and VAC application.  2. Diarrhea, now improved.  3. Osteomyelitis right distal fibula.  4. Asthma.  5. Hypertension.  6. Diabetes mellitus.   SURGICAL PROCEDURES:  On June 08, 2006 [name] Houston underwent an I&D  of the right distal fibula wound with removal of hardware and  application of a wound VAC by Dr. Marcie Mowers assisted by Rexene Edison PA-C.   COMPLICATIONS:  None.   CONSULTANTS:  1. Physical therapy and infectious disease consult June 08, 2006 in      addition to a case management consult on February 9th.  2. Occupational therapy consult June 09, 2006.   HISTORY OF PRESENT ILLNESS:  This 55 year old black female patient  presented to Dr. Madelon Houston with history of a right ankle fracture  approximately a year ago.  She had an uneventful postop course, but  began to have problems with pain on the lateral aspect of the wound.  An  area opened, fracture eventually healed and all the hardware was removed  in August 2007.  No purulence was noted but culture showed multiple  organisms.  She was treated with antibiotics, but the wound never  healed, bone scan suggested fibular osteomyelitis and because of that  she is being admitted for surgical debridement of the area, possible VAC  application and IV antibiotics.   HOSPITAL COURSE:  [name] Houston was admitted, underwent her  surgical  procedure without difficulty.  She tolerated it well.  Infectious  disease was consulted immediately, and they recommended antibiotic  coverage.  She was treated by them throughout her hospitalization.   Postop day #1 she is afebrile, vitals stable, hemoglobin 10.6,  hematocrit 31.7.  Pain was well controlled and she was switched to p.o.  meds.  She was continued on normal therapy.  She was continued on Zosyn  at that time.   She continued to do well over the next several days.  Fever curve seemed  to come down.  Postop day #3 VAC was changed.  She was afebrile, vitals  stable.  She was having some issues with diarrhea and cultures were  obtained for C. diff, which were negative.  She was continued on  antibiotics.  Cultures in her right leg did show a few gram-negative  rods but C. diff was negative.   On February 12th she was feeling better, pain was well controlled with  meds, she was ambulating.  With C. diff being negative infectious  disease  felt she was okay to be discharged home.  She was to be  discharged home on Augmentin 875 twice a day for at least 6 weeks, VAC  was to be continued and she was able to be discharged home later that  day.   DISCHARGE INSTRUCTIONS:  Diet:  She can resume her regular diabetic  diet.   Medications:  She may resume her prehospitalization home meds except no  Vicodin while on Percocet.   Home meds included:  1. Amaryl 2 mg p.o. q.a.m.  2. Naprosyn 220 mg p.o. b.i.d. p.r.n.  3. Metformin 850 mg p.o. 2 tablets q.a.m. and 1 tablet q.p.m.Marland Kitchen  4. Accupril 20 mg p.o. b.i.d..  5. Aspirin 81 mg p.o. q.a.m.   She is not to be on Vicodin because she is on:  1. Percocet 5/325 mg 1-2 tablets p.o. q.4h. p.r.n. for pain 60 with no      refill.  2. Robaxin 500 mg 1-2 p.o. q.6h. p.r.n. for spasms, 40 with no refill.  3. Augmentin 875 mg 1 tablet p.o. b.i.d. for least 6 weeks.   ACTIVITY:  She can be out of bed, nonweight bearing on the right  leg  with use of the walker and crutches.   WOUND CARE:  The wound VAC needs to be change every Monday, Wednesday  and Friday.  Home health nurse by [facility] is arranged for  that.   FOLLOW-UP:  She needs follow up with Dr. Madelon Houston in our office on  Monday, February 18th and needs to call [phone] for that appointment.  ID follow-up will be arranged per Dr. Orvan Falconer office.   LABORATORY DATA:  X-ray taken of the right ankle on February 8th shows  irregularity of the distal fibula consistent with a history of  osteomyelitis and no evidence for acute fracture or subluxation.  Chest  x-ray done on February 5th showed no active cardiopulmonary disease.   Hemoglobin and hematocrit ranged from 11.6 and 35.5 on the 5th to 10.6  and 31.7 on the 9th.  Platelets were 421 on the 5th, white count within  normal limits.   Glucose ranged from 115 on the 5th to 187 on the 9th, sodium dropped to  a low of 133 on the 9th.  Estimated GFR dropped to 55 on the 9th.  Hemoglobin A1c on February 8th was 8.5%.   Wound culture taken from the right distal fibula on February 8th showed  no white cells, no squamous epithelials, no organisms but had a few  Citrobacter koseri grow out eventually, which was sensitive to all  antibiotics.  All other laboratory studies were within normal limits.      Legrand Pitts Duffy, Arnetha Courser, M.D.  Electronically Signed    KED/MEDQ  D:  07/30/2006  T:  07/30/2006  Job:  [id number]

## 2010-12-22 ENCOUNTER — Other Ambulatory Visit: Payer: Self-pay | Admitting: Family Medicine

## 2010-12-22 DIAGNOSIS — Z1231 Encounter for screening mammogram for malignant neoplasm of breast: Secondary | ICD-10-CM

## 2011-01-23 LAB — CBC
HCT: 25.2 — ABNORMAL LOW
HCT: 26 — ABNORMAL LOW
HCT: 26.2 — ABNORMAL LOW
HCT: 27.6 — ABNORMAL LOW
HCT: 33.8 — ABNORMAL LOW
Hemoglobin: 11.1 — ABNORMAL LOW
Hemoglobin: 8.4 — ABNORMAL LOW
Hemoglobin: 8.7 — ABNORMAL LOW
Hemoglobin: 8.7 — ABNORMAL LOW
Hemoglobin: 9.2 — ABNORMAL LOW
MCHC: 32.8
MCHC: 33.2
MCHC: 33.2
MCHC: 33.3
MCHC: 33.5
MCV: 87.8
MCV: 88.7
MCV: 88.8
MCV: 88.8
MCV: 89
Platelets: 216
Platelets: 219
Platelets: 250
Platelets: 288
Platelets: 333
RBC: 2.84 — ABNORMAL LOW
RBC: 2.93 — ABNORMAL LOW
RBC: 2.95 — ABNORMAL LOW
RBC: 3.1 — ABNORMAL LOW
RBC: 3.85 — ABNORMAL LOW
RDW: 13.6
RDW: 13.7
RDW: 14.2
RDW: 14.4
RDW: 14.4
WBC: 10.2
WBC: 12.9 — ABNORMAL HIGH
WBC: 13.5 — ABNORMAL HIGH
WBC: 7.5
WBC: 9

## 2011-01-23 LAB — DIFFERENTIAL
Basophils Absolute: 0
Basophils Absolute: 0
Basophils Relative: 0
Basophils Relative: 0
Eosinophils Absolute: 0
Eosinophils Absolute: 0.4
Eosinophils Relative: 0
Eosinophils Relative: 5
Lymphocytes Relative: 19
Lymphocytes Relative: 20
Lymphs Abs: 1.5
Lymphs Abs: 2.6
Monocytes Absolute: 0.5
Monocytes Absolute: 0.8
Monocytes Relative: 10
Monocytes Relative: 4
Neutro Abs: 10.4 — ABNORMAL HIGH
Neutro Abs: 4.8
Neutrophils Relative %: 64
Neutrophils Relative %: 77

## 2011-01-23 LAB — URINE CULTURE
Colony Count: >=100000
Colony Count: >=100000
Special Requests: NEGATIVE

## 2011-01-23 LAB — URINALYSIS, ROUTINE W REFLEX MICROSCOPIC
Bilirubin Urine: NEGATIVE
Glucose, UA: 100 — AB
Glucose, UA: NEGATIVE
Hgb urine dipstick: NEGATIVE
Hgb urine dipstick: NEGATIVE
Ketones, ur: 15 — AB
Ketones, ur: NEGATIVE
Nitrite: NEGATIVE
Nitrite: NEGATIVE
Protein, ur: NEGATIVE
Protein, ur: NEGATIVE
Specific Gravity, Urine: 1.008
Specific Gravity, Urine: 1.028
Urobilinogen, UA: 0.2
Urobilinogen, UA: 0.2
pH: 5
pH: 7.5

## 2011-01-23 LAB — BASIC METABOLIC PANEL
BUN: 5 — ABNORMAL LOW
BUN: 7
BUN: 7
CO2: 25
CO2: 27
CO2: 29
Calcium: 8.6
Calcium: 9.7
Calcium: 9.8
Chloride: 101
Chloride: 104
Chloride: 97
Creatinine, Ser: 1
Creatinine, Ser: 1.03
Creatinine, Ser: 1.11
GFR calc Af Amer: >60
GFR calc Af Amer: >60
GFR calc Af Amer: >60
GFR calc non Af Amer: 52 — ABNORMAL LOW
GFR calc non Af Amer: 57 — ABNORMAL LOW
GFR calc non Af Amer: 59 — ABNORMAL LOW
Glucose, Bld: 125 — ABNORMAL HIGH
Glucose, Bld: 161 — ABNORMAL HIGH
Glucose, Bld: 190 — ABNORMAL HIGH
Potassium: 4
Potassium: 4.2
Potassium: 4.5
Sodium: 134 — ABNORMAL LOW
Sodium: 135
Sodium: 136

## 2011-01-23 LAB — HEMOGLOBIN A1C
Hgb A1c MFr Bld: 6.7 — ABNORMAL HIGH
Mean Plasma Glucose: 161

## 2011-01-23 LAB — COMPREHENSIVE METABOLIC PANEL
ALT: 20
AST: 21
Albumin: 4
Alkaline Phosphatase: 46
BUN: 13
CO2: 21
Calcium: 9.4
Chloride: 101
Creatinine, Ser: 1.05
GFR calc Af Amer: >60
GFR calc non Af Amer: 56 — ABNORMAL LOW
Glucose, Bld: 129 — ABNORMAL HIGH
Potassium: 3.6
Sodium: 137
Total Bilirubin: 0.3
Total Protein: 7.8

## 2011-01-23 LAB — CULTURE, BLOOD (ROUTINE X 2)
Culture: NO GROWTH
Culture: NO GROWTH

## 2011-01-23 LAB — SAMPLE TO BLOOD BANK

## 2011-01-27 ENCOUNTER — Ambulatory Visit: Payer: Self-pay

## 2011-01-31 ENCOUNTER — Ambulatory Visit
Admission: RE | Admit: 2011-01-31 | Discharge: 2011-01-31 | Disposition: A | Discharge status: Home / Self Care | Payer: Medicare HMO | Source: Ambulatory Visit | Attending: Family Medicine | Admitting: Family Medicine

## 2011-01-31 DIAGNOSIS — Z1231 Encounter for screening mammogram for malignant neoplasm of breast: Secondary | ICD-10-CM

## 2011-02-06 ENCOUNTER — Other Ambulatory Visit: Payer: Self-pay | Admitting: Family Medicine

## 2011-02-06 DIAGNOSIS — R928 Other abnormal and inconclusive findings on diagnostic imaging of breast: Secondary | ICD-10-CM

## 2011-02-13 LAB — DIFFERENTIAL
Basophils Absolute: 0
Basophils Relative: 0
Eosinophils Absolute: 0.3
Eosinophils Relative: 3
Lymphocytes Relative: 34
Lymphs Abs: 3
Monocytes Absolute: 0.6
Monocytes Relative: 6
Neutro Abs: 4.9
Neutrophils Relative %: 56

## 2011-02-13 LAB — COMPREHENSIVE METABOLIC PANEL
ALT: 14
AST: 19
Albumin: 3.6
Alkaline Phosphatase: 53
BUN: 16
CO2: 27
Calcium: 9.4
Chloride: 102
Creatinine, Ser: 0.89
GFR calc Af Amer: >60
GFR calc non Af Amer: >60
Glucose, Bld: 122 — ABNORMAL HIGH
Potassium: 3.8
Sodium: 137
Total Bilirubin: 0.8
Total Protein: 7.4

## 2011-02-13 LAB — ANAEROBIC CULTURE
Gram Stain: NONE SEEN
Special Requests: 8100
Special Requests: 8100

## 2011-02-13 LAB — URINALYSIS, ROUTINE W REFLEX MICROSCOPIC
Bilirubin Urine: NEGATIVE
Glucose, UA: NEGATIVE
Ketones, ur: NEGATIVE
Leukocytes, UA: NEGATIVE
Nitrite: NEGATIVE
Protein, ur: NEGATIVE
Specific Gravity, Urine: 1.027
Urobilinogen, UA: 0.2
pH: 6

## 2011-02-13 LAB — CULTURE, ROUTINE-ABSCESS
Culture: NO GROWTH
Special Requests: 8100
Special Requests: 8100

## 2011-02-13 LAB — URINE MICROSCOPIC-ADD ON

## 2011-02-13 LAB — PREGNANCY, URINE: Preg Test, Ur: NEGATIVE

## 2011-02-13 LAB — CBC
HCT: 33 — ABNORMAL LOW
Hemoglobin: 11 — ABNORMAL LOW
MCHC: 33.3
MCV: 82.7
Platelets: 368
RBC: 3.99
RDW: 17.5 — ABNORMAL HIGH
WBC: 8.9

## 2011-02-21 ENCOUNTER — Other Ambulatory Visit: Payer: Medicare HMO

## 2011-03-07 ENCOUNTER — Ambulatory Visit
Admission: RE | Admit: 2011-03-07 | Discharge: 2011-03-07 | Disposition: A | Discharge status: Home / Self Care | Payer: Medicare HMO | Source: Ambulatory Visit | Attending: Family Medicine | Admitting: Family Medicine

## 2011-03-07 DIAGNOSIS — R928 Other abnormal and inconclusive findings on diagnostic imaging of breast: Secondary | ICD-10-CM

## 2011-05-02 DIAGNOSIS — N289 Disorder of kidney and ureter, unspecified: Secondary | ICD-10-CM

## 2011-05-02 HISTORY — DX: Disorder of kidney and ureter, unspecified: N28.9

## 2012-02-08 ENCOUNTER — Other Ambulatory Visit: Payer: Self-pay | Admitting: Family Medicine

## 2012-02-08 DIAGNOSIS — Z1231 Encounter for screening mammogram for malignant neoplasm of breast: Secondary | ICD-10-CM

## 2012-02-27 ENCOUNTER — Ambulatory Visit
Admission: RE | Admit: 2012-02-27 | Discharge: 2012-02-27 | Disposition: A | Discharge status: Home / Self Care | Payer: Medicare HMO | Source: Ambulatory Visit | Attending: Family Medicine | Admitting: Family Medicine

## 2012-02-27 DIAGNOSIS — Z1231 Encounter for screening mammogram for malignant neoplasm of breast: Secondary | ICD-10-CM

## 2012-08-04 ENCOUNTER — Emergency Department (INDEPENDENT_AMBULATORY_CARE_PROVIDER_SITE_OTHER)
Admission: EM | Admit: 2012-08-04 | Discharge: 2012-08-04 | Disposition: A | Discharge status: Home / Self Care | Payer: Medicare HMO | Source: Home / Self Care | Attending: Emergency Medicine | Admitting: Emergency Medicine

## 2012-08-04 ENCOUNTER — Encounter (HOSPITAL_COMMUNITY): Payer: Self-pay | Admitting: *Deleted

## 2012-08-04 DIAGNOSIS — IMO0002 Reserved for concepts with insufficient information to code with codable children: Secondary | ICD-10-CM

## 2012-08-04 DIAGNOSIS — S73101A Unspecified sprain of right hip, initial encounter: Secondary | ICD-10-CM

## 2012-08-04 HISTORY — DX: Type 2 diabetes mellitus without complications: E11.9

## 2012-08-04 HISTORY — DX: Disorder of kidney and ureter, unspecified: N28.9

## 2012-08-04 HISTORY — DX: Essential (primary) hypertension: I10

## 2012-08-04 MED ORDER — IBUPROFEN 800 MG PO TABS
800.0000 mg | ORAL_TABLET | Freq: Once | ORAL | Status: AC
  Administered 2012-08-04: 800 mg via ORAL

## 2012-08-04 MED ORDER — MELOXICAM 15 MG PO TABS: 15.0000 mg | ORAL_TABLET | Freq: Every day | ORAL | Status: DC

## 2012-08-04 MED ORDER — TRAMADOL HCL 50 MG PO TABS: 100.0000 mg | ORAL_TABLET | Freq: Three times a day (TID) | ORAL | Status: DC | PRN

## 2012-08-04 MED ORDER — HYDROCODONE-ACETAMINOPHEN 5-325 MG PO TABS
ORAL_TABLET | ORAL | Status: AC
  Filled 2012-08-04: qty 1

## 2012-08-04 MED ORDER — IBUPROFEN 800 MG PO TABS
ORAL_TABLET | ORAL | Status: AC
  Filled 2012-08-04: qty 1

## 2012-08-04 MED ORDER — HYDROCODONE-ACETAMINOPHEN 5-325 MG PO TABS
1.0000 | ORAL_TABLET | Freq: Once | ORAL | Status: AC
  Administered 2012-08-04: 1 via ORAL

## 2012-08-04 NOTE — ED Notes (Signed)
Patient complains of right hip pain. Patient states she twisted her hip this morning at church. Pain while ambulating and weight bearing.

## 2012-08-04 NOTE — ED Provider Notes (Signed)
Chief Complaint:   Chief Complaint  Patient presents with  . Hip Pain    History of Present Illness:   [name] Houston is a 57 year old female who was walking across a church parking lot this afternoon when she twisted and felt a popping sensation in her right hip, just below the iliac crest. This would hurt to ambulate. She did not fall and there was no other trauma. She denies any swelling or bruising. There is no radiation down the leg, numbness, or tingling. She is able to ambulate, but walks with a limp.  Review of Systems:  Other than noted above, the patient denies any of the following symptoms: Systemic:  No fevers, chills, sweats, or aches.  No fatigue or tiredness. Musculoskeletal:  No joint pain, arthritis, bursitis, swelling, back pain, or neck pain. Neurological:  No muscular weakness, paresthesias, headache, or trouble with speech or coordination.  No dizziness.  PMFSH:  Past medical history, family history, social history, meds, and allergies were reviewed.     Physical Exam:   Vital signs:  BP 138/86  Pulse 81  Temp(Src) 98.1 F (36.7 C) (Oral)  Resp 18  SpO2 100%  LMP 01/18/2011 Gen:  Alert and oriented times 3.  In no distress. Musculoskeletal: no swelling, bruising, or deformity. There is pain to palpation just below the iliac crest. The hip has a full range of motion but with pain on movement.  Otherwise, all joints had a full a ROM with no swelling, bruising or deformity.  No edema, pulses full. Extremities were warm and pink.  Capillary refill was brisk.  Skin:  Clear, warm and dry.  No rash. Neuro:  Alert and oriented times 3.  Muscle strength was normal.  Sensation was intact to light touch.   Assessment:  The encounter diagnosis was Hip sprain, right, initial encounter.  Plan:   1.  The following meds were prescribed:   New Prescriptions   MELOXICAM (MOBIC) 15 MG TABLET    Take 1 tablet (15 mg total) by mouth daily.   TRAMADOL (ULTRAM) 50 MG TABLET    Take  2 tablets (100 mg total) by mouth every 8 (eight) hours as needed for pain.   2.  The patient was instructed in symptomatic care, including rest and activity, elevation, application of ice and compression.  Appropriate handouts were given. 3.  The patient was told to return if becoming worse in any way, if no better in 3 or 4 days, and given some red flag symptoms such as increasing pain, increasing disability, or neurological symptoms that would indicate earlier return.   4.  The patient was told to follow up with Dr. Jene Every next week.    Reuben Likes, MD 08/04/12 [phone]

## 2013-02-11 ENCOUNTER — Other Ambulatory Visit: Payer: Self-pay

## 2013-02-11 DIAGNOSIS — Z1231 Encounter for screening mammogram for malignant neoplasm of breast: Secondary | ICD-10-CM

## 2013-02-27 ENCOUNTER — Ambulatory Visit: Payer: Medicare HMO

## 2013-03-20 ENCOUNTER — Ambulatory Visit: Payer: Medicare HMO

## 2013-05-01 DIAGNOSIS — K449 Diaphragmatic hernia without obstruction or gangrene: Secondary | ICD-10-CM

## 2013-05-01 HISTORY — DX: Diaphragmatic hernia without obstruction or gangrene: K44.9

## 2013-07-09 ENCOUNTER — Emergency Department (HOSPITAL_COMMUNITY): Payer: Medicare HMO

## 2013-07-09 ENCOUNTER — Encounter (HOSPITAL_COMMUNITY): Payer: Self-pay | Admitting: Emergency Medicine

## 2013-07-09 ENCOUNTER — Emergency Department (HOSPITAL_COMMUNITY)
Admission: EM | Admit: 2013-07-09 | Discharge: 2013-07-09 | Disposition: A | Discharge status: Home / Self Care | Payer: Medicare HMO | Attending: Emergency Medicine | Admitting: Emergency Medicine

## 2013-07-09 ENCOUNTER — Encounter: Payer: Self-pay | Admitting: Nurse Practitioner

## 2013-07-09 DIAGNOSIS — Z7982 Long term (current) use of aspirin: Secondary | ICD-10-CM | POA: Insufficient documentation

## 2013-07-09 DIAGNOSIS — R05 Cough: Secondary | ICD-10-CM | POA: Insufficient documentation

## 2013-07-09 DIAGNOSIS — I10 Essential (primary) hypertension: Secondary | ICD-10-CM | POA: Insufficient documentation

## 2013-07-09 DIAGNOSIS — Z791 Long term (current) use of non-steroidal anti-inflammatories (NSAID): Secondary | ICD-10-CM | POA: Insufficient documentation

## 2013-07-09 DIAGNOSIS — R059 Cough, unspecified: Secondary | ICD-10-CM | POA: Insufficient documentation

## 2013-07-09 DIAGNOSIS — Z87448 Personal history of other diseases of urinary system: Secondary | ICD-10-CM | POA: Insufficient documentation

## 2013-07-09 DIAGNOSIS — K449 Diaphragmatic hernia without obstruction or gangrene: Secondary | ICD-10-CM | POA: Insufficient documentation

## 2013-07-09 DIAGNOSIS — Z79899 Other long term (current) drug therapy: Secondary | ICD-10-CM | POA: Insufficient documentation

## 2013-07-09 DIAGNOSIS — E119 Type 2 diabetes mellitus without complications: Secondary | ICD-10-CM | POA: Insufficient documentation

## 2013-07-09 DIAGNOSIS — K222 Esophageal obstruction: Secondary | ICD-10-CM | POA: Insufficient documentation

## 2013-07-09 DIAGNOSIS — F17200 Nicotine dependence, unspecified, uncomplicated: Secondary | ICD-10-CM | POA: Insufficient documentation

## 2013-07-09 LAB — I-STAT CHEM 8, ED
BUN: 20 mg/dL (ref 6–23)
Calcium, Ion: 1.34 mmol/L — ABNORMAL HIGH (ref 1.12–1.23)
Chloride: 98 mEq/L (ref 96–112)
Creatinine, Ser: 1.1 mg/dL (ref 0.50–1.10)
Glucose, Bld: 203 mg/dL — ABNORMAL HIGH (ref 70–99)
HCT: 44 % (ref 36.0–46.0)
Hemoglobin: 15 g/dL (ref 12.0–15.0)
Potassium: 3.7 mEq/L (ref 3.7–5.3)
Sodium: 139 mEq/L (ref 137–147)
TCO2: 25 mmol/L (ref 0–100)

## 2013-07-09 MED ORDER — ONDANSETRON 4 MG PO TBDP
8.0000 mg | ORAL_TABLET | Freq: Once | ORAL | Status: AC
  Administered 2013-07-09: 8 mg via ORAL
  Filled 2013-07-09: qty 2

## 2013-07-09 MED ORDER — IBUPROFEN 400 MG PO TABS
400.0000 mg | ORAL_TABLET | Freq: Once | ORAL | Status: AC
  Administered 2013-07-09: 400 mg via ORAL
  Filled 2013-07-09: qty 1

## 2013-07-09 MED ORDER — OMEPRAZOLE 20 MG PO CPDR: 20.0000 mg | DELAYED_RELEASE_CAPSULE | Freq: Every day | ORAL | Status: DC

## 2013-07-09 NOTE — ED Notes (Addendum)
Patient states that when she woke up her throat was hurting and she felt like there was something in the right side of her throat.  Also c/o nausea.  Able to swallow but has been gagging.  Throat has red patches.

## 2013-07-09 NOTE — ED Notes (Signed)
Pt. woke up this morning with right sore throat and nausea , denies cough or congestion , no fever or chills. Airway intact / respirations unlabored .

## 2013-07-09 NOTE — ED Notes (Signed)
Dr. Doy Mince at bedside. Updated pt plan of care to speak with GI.

## 2013-07-09 NOTE — ED Notes (Signed)
Patient back from x-ray 

## 2013-07-09 NOTE — Discharge Instructions (Signed)
Esophageal Stricture The esophagus is the long, narrow tube which carries food and liquid from the mouth to the stomach. Sometimes a part of the esophagus becomes narrow and makes it difficult, painful, or even impossible to swallow. This is called an esophageal stricture.  CAUSES  Common causes of blockage or strictures of the esophagus are:  Exposure of the lower esophagus to the acid from the stomach may cause narrowing.  Hiatal hernia in which a small part of the stomach bulges up through the diaphragm can cause a narrowing in the bottom of the esophagus.  Scleroderma is a tissue disorder that affects the esophagus and makes swallowing difficult.  Achalasia is an absence of nerves in the lower esophagus and to the esophageal sphincter. This absence of nerves may be congenital (present since birth). This can cause irregular spasms which do not allow food and fluid through.  Strictures may develop from swallowing materials which damage the esophagus. Examples are acids or alkalis such as lye.  Schatzki's Ring is a narrow ring of non-cancerous tissue which narrows the lower esophagus. The cause of this is unknown.  Growths can block the esophagus. SYMPTOMS  Some of the problems are difficulty swallowing or pain with swallowing. DIAGNOSIS  Your caregiver often suspects this problem by taking a medical history. They will also do a physical exam. They may then take X-rays and/or perform an endoscopy. Endoscopy is an exam in which a tube like a small flexible telescope is used to look at your esophagus.  TREATMENT  One form of treatment is to dilate the narrow area. This means to stretch it.  When this is not successful, chest surgery may be required. This is a much more extensive form of treatment with a longer recovery time. Both of the above treatments make the passage of food and water into the stomach easier. They also make it easier for stomach contents to bubble back into the  esophagus. Special medications may be used following the procedure to help prevent further narrowing. Medications may be used to lower the amount of acid in the stomach juice.  SEEK IMMEDIATE MEDICAL CARE IF:   Your swallowing is becoming more painful, difficult, or you are unable to swallow.  You vomit up blood.  You develop black tarry stools.  You develop chills.  You have a fever.  You develop chest or abdominal pain.  You develop shortness of breath, feel lightheaded, or faint. Follow up with medical care as your caregiver suggests. Document Released: 12/26/2005 Document Revised: 07/10/2011 Document Reviewed: 02/01/2006 [facility] Patient Information 2014 [place], [place].

## 2013-07-09 NOTE — ED Provider Notes (Signed)
Care assumed from Dr. Kathrynn Humble.  Esophagram demonstrated a hiatal hernia, distal esophageal stricture, and possible non-impacted food bolus. I discussed this with [name] [name] (GI) recommended close outpatient followup and initiating omeprazole. Patient well appearing at this time, not drooling, able to swallow.  Advised soft diet until GI followup  Clinical Impression: 1. Esophageal stricture   2. Hiatal hernia       [name] Siren III, MD 07/09/13 1137

## 2013-07-09 NOTE — ED Provider Notes (Addendum)
CSN: [id number]     Arrival date & time 07/09/13  0508 History   First MD Initiated Contact with Patient 07/09/13 [phone]     Chief Complaint  Patient presents with  . Sore Throat     (Consider location/radiation/quality/duration/timing/severity/associated sxs/prior Treatment) HPI Comments: PT comes in with cc of sore throat. Hx of DM. States that she woke up from her sleep around 2 am with feeling that she was going to burp, and that was followed by sore throat. Pt has right sided sore throat, with sensation that something is stuck. She has a cough as well. No hx of similar complains, denies dib, or feeling like her throat is closing. She also denies any hx of same before, no night sweats, weight loss.  Patient is a 58 y.o. female presenting with pharyngitis. The history is provided by the patient.  Sore Throat Pertinent negatives include no chest pain, no abdominal pain, no headaches and no shortness of breath.    Past Medical History  Diagnosis Date  . Diabetes mellitus without complication   . Kidney disease 2013  . Hypertension    History reviewed. No pertinent past surgical history. No family history on file. History  Substance Use Topics  . Smoking status: Heavy Tobacco Smoker -- 0.50 packs/day    Types: Cigarettes  . Smokeless tobacco: Not on file  . Alcohol Use: Yes   OB History   Grav Para Term Preterm Abortions TAB SAB Ect Mult Living                 Review of Systems  Constitutional: Negative for activity change.  HENT: Positive for sore throat. Negative for drooling.   Respiratory: Positive for cough. Negative for shortness of breath, wheezing and stridor.   Cardiovascular: Negative for chest pain.  Gastrointestinal: Negative for nausea, vomiting and abdominal pain.  Genitourinary: Negative for dysuria.  Musculoskeletal: Negative for neck pain.  Neurological: Negative for headaches.      Allergies  Review of patient's allergies indicates no known  allergies.  Home Medications   Current Outpatient Rx  Name  Route  Sig  Dispense  Refill  . amLODipine (NORVASC) 10 MG tablet   Oral   Take 10 mg by mouth daily.         Marland Kitchen aspirin 81 MG tablet   Oral   Take 81 mg by mouth daily.         Marland Kitchen lisinopril-hydrochlorothiazide (PRINZIDE,ZESTORETIC) 20-25 MG per tablet   Oral   Take 1 tablet by mouth daily.         . meloxicam (MOBIC) 15 MG tablet   Oral   Take 1 tablet (15 mg total) by mouth daily.   15 tablet   0   . metFORMIN (GLUCOPHAGE) 500 MG tablet   Oral   Take 500 mg by mouth 2 (two) times daily with a meal.         . pravastatin (PRAVACHOL) 40 MG tablet   Oral   Take 40 mg by mouth daily.         . sitaGLIPtin (JANUVIA) 100 MG tablet   Oral   Take 100 mg by mouth daily.         . traMADol (ULTRAM) 50 MG tablet   Oral   Take 2 tablets (100 mg total) by mouth every 8 (eight) hours as needed for pain.   30 tablet   0    BP 131/84  Pulse 98  Temp(Src) 98.7 F (37.1  C) (Oral)  Resp 14  SpO2 99%  LMP 01/18/2011 Physical Exam  Nursing note and vitals reviewed. Constitutional: She is oriented to person, place, and time. She appears well-developed and well-nourished.  HENT:  Head: Normocephalic and atraumatic.  Mouth/Throat: Oropharynx is clear and moist. No oropharyngeal exudate.  Eyes: EOM are normal. Pupils are equal, round, and reactive to light.  Neck: Neck supple. No tracheal deviation present. No thyromegaly present.  Cardiovascular: Normal rate, regular rhythm and normal heart sounds.   No murmur heard. Pulmonary/Chest: Effort normal. No stridor. No respiratory distress.  Abdominal: Soft. She exhibits no distension. There is no tenderness. There is no rebound and no guarding.  Lymphadenopathy:    She has no cervical adenopathy.  Neurological: She is alert and oriented to person, place, and time.  Skin: Skin is warm and dry.    ED Course  Procedures (including critical care time) Labs  Review Labs Reviewed  I-STAT CHEM 8, ED   Imaging Review No results found.   EKG Interpretation None      MDM   Final diagnoses:  None   Pt comes in with cc of neck pain / sore throat. Sudden onset pain, with a sensation that something is stuck. Pt has some dysphagia, but no airway issues. Pt is on lisinopril - but there is no visible angioedema.  We are starting with soft tissue neck radiographs and reassess. I don't think there is any infectious pathology. Thyroid exam grossly appears normal.   Varney Biles, MD 07/09/13 0620  7:55 AM  Initial workup is normal. Bedside swallow study is abnormal. Still no resp complains. Ordered an upper GI. Signing out to Dr. Doy Mince. If study is normal, patient will stop taking her lisinopril and we will give her ENT f/u.   Varney Biles, MD 07/09/13 0800

## 2013-07-10 ENCOUNTER — Encounter: Payer: Self-pay | Admitting: *Deleted

## 2013-07-16 ENCOUNTER — Encounter: Payer: Medicare HMO | Admitting: Nurse Practitioner

## 2013-07-16 NOTE — Patient Instructions (Signed)
Pt was called back and stated that she didn't know why she was being seen here she has an appointment with Dr. Cristina Gong and would rather keep her appointment with him.

## 2013-09-09 ENCOUNTER — Other Ambulatory Visit: Payer: Self-pay | Admitting: Gastroenterology

## 2014-01-29 ENCOUNTER — Other Ambulatory Visit: Payer: Self-pay

## 2014-01-29 DIAGNOSIS — Z1231 Encounter for screening mammogram for malignant neoplasm of breast: Secondary | ICD-10-CM

## 2014-02-05 ENCOUNTER — Ambulatory Visit
Admission: RE | Admit: 2014-02-05 | Discharge: 2014-02-05 | Disposition: A | Discharge status: Home / Self Care | Payer: Commercial Managed Care - HMO | Source: Ambulatory Visit

## 2014-02-05 DIAGNOSIS — Z1231 Encounter for screening mammogram for malignant neoplasm of breast: Secondary | ICD-10-CM

## 2014-02-06 ENCOUNTER — Other Ambulatory Visit: Payer: Self-pay | Admitting: Family Medicine

## 2014-02-06 DIAGNOSIS — R928 Other abnormal and inconclusive findings on diagnostic imaging of breast: Secondary | ICD-10-CM

## 2014-02-06 NOTE — Progress Notes (Signed)
Erroneous encounter This encounter was created in error - please disregard. 

## 2014-02-19 ENCOUNTER — Ambulatory Visit
Admission: RE | Admit: 2014-02-19 | Discharge: 2014-02-19 | Disposition: A | Discharge status: Home / Self Care | Payer: Commercial Managed Care - HMO | Source: Ambulatory Visit | Attending: Family Medicine | Admitting: Family Medicine

## 2014-02-19 DIAGNOSIS — R928 Other abnormal and inconclusive findings on diagnostic imaging of breast: Secondary | ICD-10-CM

## 2014-05-14 DIAGNOSIS — R635 Abnormal weight gain: Secondary | ICD-10-CM | POA: Diagnosis not present

## 2014-05-14 DIAGNOSIS — N182 Chronic kidney disease, stage 2 (mild): Secondary | ICD-10-CM | POA: Diagnosis not present

## 2014-05-14 DIAGNOSIS — E1121 Type 2 diabetes mellitus with diabetic nephropathy: Secondary | ICD-10-CM | POA: Diagnosis not present

## 2014-05-14 DIAGNOSIS — E785 Hyperlipidemia, unspecified: Secondary | ICD-10-CM | POA: Diagnosis not present

## 2014-05-14 DIAGNOSIS — E1165 Type 2 diabetes mellitus with hyperglycemia: Secondary | ICD-10-CM | POA: Diagnosis not present

## 2014-05-14 DIAGNOSIS — I129 Hypertensive chronic kidney disease with stage 1 through stage 4 chronic kidney disease, or unspecified chronic kidney disease: Secondary | ICD-10-CM | POA: Diagnosis not present

## 2014-05-14 DIAGNOSIS — F17200 Nicotine dependence, unspecified, uncomplicated: Secondary | ICD-10-CM | POA: Diagnosis not present

## 2014-10-01 DIAGNOSIS — H5210 Myopia, unspecified eye: Secondary | ICD-10-CM | POA: Diagnosis not present

## 2014-10-01 DIAGNOSIS — H524 Presbyopia: Secondary | ICD-10-CM | POA: Diagnosis not present

## 2014-10-08 DIAGNOSIS — E1165 Type 2 diabetes mellitus with hyperglycemia: Secondary | ICD-10-CM | POA: Diagnosis not present

## 2014-10-08 DIAGNOSIS — E785 Hyperlipidemia, unspecified: Secondary | ICD-10-CM | POA: Diagnosis not present

## 2014-10-08 DIAGNOSIS — E1121 Type 2 diabetes mellitus with diabetic nephropathy: Secondary | ICD-10-CM | POA: Diagnosis not present

## 2014-10-08 DIAGNOSIS — N183 Chronic kidney disease, stage 3 (moderate): Secondary | ICD-10-CM | POA: Diagnosis not present

## 2014-10-08 DIAGNOSIS — I129 Hypertensive chronic kidney disease with stage 1 through stage 4 chronic kidney disease, or unspecified chronic kidney disease: Secondary | ICD-10-CM | POA: Diagnosis not present

## 2015-03-18 DIAGNOSIS — E1165 Type 2 diabetes mellitus with hyperglycemia: Secondary | ICD-10-CM | POA: Diagnosis not present

## 2015-03-18 DIAGNOSIS — F17200 Nicotine dependence, unspecified, uncomplicated: Secondary | ICD-10-CM | POA: Diagnosis not present

## 2015-03-18 DIAGNOSIS — E1121 Type 2 diabetes mellitus with diabetic nephropathy: Secondary | ICD-10-CM | POA: Diagnosis not present

## 2015-03-18 DIAGNOSIS — Z23 Encounter for immunization: Secondary | ICD-10-CM | POA: Diagnosis not present

## 2015-03-18 DIAGNOSIS — N183 Chronic kidney disease, stage 3 (moderate): Secondary | ICD-10-CM | POA: Diagnosis not present

## 2015-03-18 DIAGNOSIS — E785 Hyperlipidemia, unspecified: Secondary | ICD-10-CM | POA: Diagnosis not present

## 2015-03-18 DIAGNOSIS — I129 Hypertensive chronic kidney disease with stage 1 through stage 4 chronic kidney disease, or unspecified chronic kidney disease: Secondary | ICD-10-CM | POA: Diagnosis not present

## 2015-05-20 DIAGNOSIS — E785 Hyperlipidemia, unspecified: Secondary | ICD-10-CM | POA: Diagnosis not present

## 2015-05-20 DIAGNOSIS — Z79899 Other long term (current) drug therapy: Secondary | ICD-10-CM | POA: Diagnosis not present

## 2015-06-15 ENCOUNTER — Other Ambulatory Visit (HOSPITAL_COMMUNITY)
Admission: RE | Admit: 2015-06-15 | Discharge: 2015-06-15 | Disposition: A | Discharge status: Home / Self Care | Payer: Commercial Managed Care - HMO | Source: Ambulatory Visit | Attending: Family Medicine | Admitting: Family Medicine

## 2015-06-15 ENCOUNTER — Other Ambulatory Visit: Payer: Self-pay | Admitting: Family Medicine

## 2015-06-15 DIAGNOSIS — E785 Hyperlipidemia, unspecified: Secondary | ICD-10-CM | POA: Diagnosis not present

## 2015-06-15 DIAGNOSIS — Z7984 Long term (current) use of oral hypoglycemic drugs: Secondary | ICD-10-CM | POA: Diagnosis not present

## 2015-06-15 DIAGNOSIS — E1165 Type 2 diabetes mellitus with hyperglycemia: Secondary | ICD-10-CM | POA: Diagnosis not present

## 2015-06-15 DIAGNOSIS — Z0000 Encounter for general adult medical examination without abnormal findings: Secondary | ICD-10-CM | POA: Diagnosis not present

## 2015-06-15 DIAGNOSIS — I129 Hypertensive chronic kidney disease with stage 1 through stage 4 chronic kidney disease, or unspecified chronic kidney disease: Secondary | ICD-10-CM | POA: Diagnosis not present

## 2015-06-15 DIAGNOSIS — Z124 Encounter for screening for malignant neoplasm of cervix: Secondary | ICD-10-CM | POA: Diagnosis not present

## 2015-06-15 DIAGNOSIS — N183 Chronic kidney disease, stage 3 (moderate): Secondary | ICD-10-CM | POA: Diagnosis not present

## 2015-06-15 DIAGNOSIS — E1121 Type 2 diabetes mellitus with diabetic nephropathy: Secondary | ICD-10-CM | POA: Diagnosis not present

## 2015-06-15 DIAGNOSIS — K219 Gastro-esophageal reflux disease without esophagitis: Secondary | ICD-10-CM | POA: Diagnosis not present

## 2015-06-17 LAB — CYTOLOGY - PAP

## 2015-11-04 DIAGNOSIS — E1121 Type 2 diabetes mellitus with diabetic nephropathy: Secondary | ICD-10-CM | POA: Diagnosis not present

## 2015-11-04 DIAGNOSIS — I129 Hypertensive chronic kidney disease with stage 1 through stage 4 chronic kidney disease, or unspecified chronic kidney disease: Secondary | ICD-10-CM | POA: Diagnosis not present

## 2015-11-04 DIAGNOSIS — E785 Hyperlipidemia, unspecified: Secondary | ICD-10-CM | POA: Diagnosis not present

## 2015-11-04 DIAGNOSIS — N183 Chronic kidney disease, stage 3 (moderate): Secondary | ICD-10-CM | POA: Diagnosis not present

## 2015-11-04 DIAGNOSIS — E1165 Type 2 diabetes mellitus with hyperglycemia: Secondary | ICD-10-CM | POA: Diagnosis not present

## 2016-02-24 DIAGNOSIS — H524 Presbyopia: Secondary | ICD-10-CM | POA: Diagnosis not present

## 2016-02-24 DIAGNOSIS — H2513 Age-related nuclear cataract, bilateral: Secondary | ICD-10-CM | POA: Diagnosis not present

## 2016-02-24 DIAGNOSIS — E119 Type 2 diabetes mellitus without complications: Secondary | ICD-10-CM | POA: Diagnosis not present

## 2016-02-24 DIAGNOSIS — H40013 Open angle with borderline findings, low risk, bilateral: Secondary | ICD-10-CM | POA: Diagnosis not present

## 2016-02-29 DIAGNOSIS — R202 Paresthesia of skin: Secondary | ICD-10-CM | POA: Diagnosis not present

## 2016-02-29 DIAGNOSIS — N183 Chronic kidney disease, stage 3 (moderate): Secondary | ICD-10-CM | POA: Diagnosis not present

## 2016-02-29 DIAGNOSIS — E1121 Type 2 diabetes mellitus with diabetic nephropathy: Secondary | ICD-10-CM | POA: Diagnosis not present

## 2016-02-29 DIAGNOSIS — K219 Gastro-esophageal reflux disease without esophagitis: Secondary | ICD-10-CM | POA: Diagnosis not present

## 2016-02-29 DIAGNOSIS — E785 Hyperlipidemia, unspecified: Secondary | ICD-10-CM | POA: Diagnosis not present

## 2016-02-29 DIAGNOSIS — I129 Hypertensive chronic kidney disease with stage 1 through stage 4 chronic kidney disease, or unspecified chronic kidney disease: Secondary | ICD-10-CM | POA: Diagnosis not present

## 2016-03-14 DIAGNOSIS — D649 Anemia, unspecified: Secondary | ICD-10-CM | POA: Diagnosis not present

## 2016-03-14 DIAGNOSIS — Z23 Encounter for immunization: Secondary | ICD-10-CM | POA: Diagnosis not present

## 2016-04-13 DIAGNOSIS — E876 Hypokalemia: Secondary | ICD-10-CM | POA: Diagnosis not present

## 2016-06-15 DIAGNOSIS — Z7984 Long term (current) use of oral hypoglycemic drugs: Secondary | ICD-10-CM | POA: Diagnosis not present

## 2016-06-15 DIAGNOSIS — E1121 Type 2 diabetes mellitus with diabetic nephropathy: Secondary | ICD-10-CM | POA: Diagnosis not present

## 2016-06-15 DIAGNOSIS — E785 Hyperlipidemia, unspecified: Secondary | ICD-10-CM | POA: Diagnosis not present

## 2016-06-15 DIAGNOSIS — L732 Hidradenitis suppurativa: Secondary | ICD-10-CM | POA: Diagnosis not present

## 2016-06-15 DIAGNOSIS — N182 Chronic kidney disease, stage 2 (mild): Secondary | ICD-10-CM | POA: Diagnosis not present

## 2016-06-15 DIAGNOSIS — D649 Anemia, unspecified: Secondary | ICD-10-CM | POA: Diagnosis not present

## 2016-06-15 DIAGNOSIS — Z1389 Encounter for screening for other disorder: Secondary | ICD-10-CM | POA: Diagnosis not present

## 2016-06-15 DIAGNOSIS — I129 Hypertensive chronic kidney disease with stage 1 through stage 4 chronic kidney disease, or unspecified chronic kidney disease: Secondary | ICD-10-CM | POA: Diagnosis not present

## 2016-06-15 DIAGNOSIS — Z0000 Encounter for general adult medical examination without abnormal findings: Secondary | ICD-10-CM | POA: Diagnosis not present

## 2017-01-26 ENCOUNTER — Ambulatory Visit (HOSPITAL_COMMUNITY)
Admission: EM | Admit: 2017-01-26 | Discharge: 2017-01-26 | Disposition: A | Discharge status: Home / Self Care | Payer: Medicare HMO | Attending: Internal Medicine | Admitting: Internal Medicine

## 2017-01-26 ENCOUNTER — Encounter (HOSPITAL_COMMUNITY): Payer: Self-pay | Admitting: *Deleted

## 2017-01-26 DIAGNOSIS — M549 Dorsalgia, unspecified: Secondary | ICD-10-CM

## 2017-01-26 DIAGNOSIS — S29012A Strain of muscle and tendon of back wall of thorax, initial encounter: Secondary | ICD-10-CM

## 2017-01-26 DIAGNOSIS — T148XXA Other injury of unspecified body region, initial encounter: Secondary | ICD-10-CM | POA: Diagnosis not present

## 2017-01-26 MED ORDER — LIDOCAINE 5 % EX PTCH: 1.0000 | MEDICATED_PATCH | CUTANEOUS | 0 refills | Status: DC

## 2017-01-26 MED ORDER — CYCLOBENZAPRINE HCL 10 MG PO TABS: 5.0000 mg | ORAL_TABLET | Freq: Every evening | ORAL | 0 refills | Status: DC | PRN

## 2017-01-26 NOTE — Discharge Instructions (Signed)
Your exam was most consistent with muscle strain. Start tylenol/lidoderm patches as directed. Flexeril as needed at night. Flexeril can make you drowsy, so do not take if you are going to drive, operate heavy machinery, or make important decisions. Ice/heat compresses as needed. This can take up to 3-4 weeks to completely resolve, but you should be feeling better each week. Follow up here or with PCP if symptoms worsen, changes for reevaluation. If experience numbness/tingling of the inner thighs, loss of bladder or bowel control, go to the emergency department for evaluation.

## 2017-01-26 NOTE — ED Provider Notes (Signed)
[facility]    CSN: [id number] Arrival date & time: 01/26/17  1046     History   Chief Complaint Chief Complaint  Patient presents with  . Back Pain    HPI [name] Houston is a 61 y.o. female.   61 year old female with history of diabetes, hiatal hernia, kidney disease comes in for 3 day history of right upper back pain. Patient states pain is worse with coughing, movement. To prevent further pain, she has been sleeping on her left side, and is now experiencing left neck pain. She states she states that the covers on, and worries about "back cold". She works at [employer], requires a lot of wiping and heavy lifting. Denies numbness, tingling, injury. Patient states she is tried TheraFlu without relief. Tylenol with good relief. Denies cough, congestion, ear pain, eye pain. Denies fever, chills, night sweats.      Past Medical History:  Diagnosis Date  . Diabetes mellitus without complication ([place])   . Hiatal hernia 2015   DG Esophagus   . Hyperlipidemia   . Hypertension   . Kidney disease 2013    Patient Active Problem List   Diagnosis Date Noted  . DIABETES MELLITUS, TYPE II 08/23/2006  . HYPERTENSION 08/23/2006  . INFECTION DUE TO INTERNAL JOINT PROSTHESIS 08/23/2006    Past Surgical History:  Procedure Laterality Date  . BREAST ABSESS DRAINED  7/08  . CHOLECYSTECTOMY    . ORIF TIBIA PLATEAU  3/09    OB History    No data available       Home Medications    Prior to Admission medications   Medication Sig Start Date End Date Taking? Authorizing Provider  amLODipine (NORVASC) 10 MG tablet Take 10 mg by mouth daily.    [provider]  aspirin 81 MG tablet Take 81 mg by mouth daily.    [provider]  calcium carbonate (OS-CAL) 600 MG TABS tablet Take 600 mg by mouth 2 (two) times daily with a meal.    [provider]  cyclobenzaprine (FLEXERIL) 10 MG tablet Take 0.5-1 tablets (5-10 mg total) by mouth at bedtime  as needed for muscle spasms. 01/26/17   Tasia Catchings, Matther Labell V, PA-C  lidocaine (LIDODERM) 5 % Place 1 patch onto the skin daily. Remove & Discard patch within 12 hours or as directed by MD 01/26/17   Ok Edwards, PA-C  lisinopril-hydrochlorothiazide (PRINZIDE,ZESTORETIC) 20-25 MG per tablet Take 1 tablet by mouth daily.    [provider]  metFORMIN (GLUMETZA) 500 MG (MOD) 24 hr tablet Take 500 mg by mouth.    [provider]  omeprazole (PRILOSEC) 20 MG capsule Take 1 capsule (20 mg total) by mouth daily. 07/09/13   Serita Grit, MD  pravastatin (PRAVACHOL) 40 MG tablet Take 40 mg by mouth daily.    [provider]  traMADol (ULTRAM) 50 MG tablet Take 2 tablets (100 mg total) by mouth every 8 (eight) hours as needed for pain. 08/04/12   Harden Mo, MD    Family History Family History  Problem Relation Age of Onset  . Hypertension Mother   . Diabetes Mother   . CVA Father   . Hypertension Father   . Diabetes Father   . Pancreatic cancer Father   . Pulmonary embolism Father   . Bone cancer Maternal Aunt   . Diabetes Paternal Grandmother     Social History Social History  Substance Use Topics  . Smoking status: Heavy Tobacco Smoker  Packs/day: 0.50    Types: Cigarettes  . Smokeless tobacco: Not on file  . Alcohol use Yes     Allergies   Patient has no known allergies.   Review of Systems Review of Systems  Reason unable to perform ROS: See HPI as above.     Physical Exam Triage Vital Signs ED Triage Vitals  Enc Vitals Group     BP 01/26/17 1115 (!) 144/74     Pulse Rate 01/26/17 1115 83     Resp 01/26/17 1115 20     Temp 01/26/17 1115 98.4 F (36.9 C)     Temp Source 01/26/17 1115 Oral     SpO2 01/26/17 1115 100 %     Weight --      Height --      Head Circumference --      Peak Flow --      Pain Score 01/26/17 1114 6     Pain Loc --      Pain Edu? --      Excl. in [place]? --    No data found.   Updated Vital Signs BP (!) 144/74 (BP  Location: Left Arm)   Pulse 83   Temp 98.4 F (36.9 C) (Oral)   Resp 20   LMP 01/18/2011   SpO2 100%   Physical Exam  Constitutional: She is oriented to person, place, and time. She appears well-developed and well-nourished. No distress.  HENT:  Head: Normocephalic and atraumatic.  Right Ear: Tympanic membrane, external ear and ear canal normal. Tympanic membrane is not erythematous and not bulging.  Left Ear: Tympanic membrane, external ear and ear canal normal. Tympanic membrane is not erythematous and not bulging.  Nose: Nose normal. Right sinus exhibits no maxillary sinus tenderness and no frontal sinus tenderness. Left sinus exhibits no maxillary sinus tenderness and no frontal sinus tenderness.  Mouth/Throat: Uvula is midline, oropharynx is clear and moist and mucous membranes are normal.  Eyes: Pupils are equal, round, and reactive to light. Conjunctivae are normal.  Neck: Normal range of motion. Neck supple. Muscular tenderness (left) present. No spinous process tenderness present. Normal range of motion present.  Cardiovascular: Normal rate, regular rhythm and normal heart sounds.  Exam reveals no gallop and no friction rub.   No murmur heard. Pulmonary/Chest: Effort normal and breath sounds normal. She has no decreased breath sounds. She has no wheezes. She has no rhonchi. She has no rales.  Musculoskeletal:  No tenderness on palpation of the spinous processes, bilateral back. Full range of motion of back and shoulders. Range of motion elicits pain experienced, which is around the right scapula. Strength normal and equal bilaterally. Sensation intact and equal bilaterally.  Lymphadenopathy:    She has no cervical adenopathy.  Neurological: She is alert and oriented to person, place, and time.  Skin: Skin is warm and dry.  Psychiatric: She has a normal mood and affect. Her behavior is normal. Judgment normal.     UC Treatments / Results  Labs (all labs ordered are listed,  but only abnormal results are displayed) Labs Reviewed - No data to display  EKG  EKG Interpretation None       Radiology No results found.  Procedures Procedures (including critical care time)  Medications Ordered in UC Medications - No data to display   Initial Impression / Assessment and Plan / UC Course  I have reviewed the triage vital signs and the nursing notes.  Pertinent labs & imaging results that were  available during my care of the patient were reviewed by me and considered in my medical decision making (see chart for details).    Discussed with patient history and exam most consistent with muscle strain. Patient with known CKD, without creatinine/GFR on file. To take Tylenol/lidocaine patches for pain. Muscle relaxant as needed. Ice/heat compresses. Discussed with patient strain can take up to 3-4 weeks to resolve, but should be getting better each week. Return precautions given.    Final Clinical Impressions(s) / UC Diagnoses   Final diagnoses:  Muscle strain    New Prescriptions New Prescriptions   CYCLOBENZAPRINE (FLEXERIL) 10 MG TABLET    Take 0.5-1 tablets (5-10 mg total) by mouth at bedtime as needed for muscle spasms.   LIDOCAINE (LIDODERM) 5 %    Place 1 patch onto the skin daily. Remove & Discard patch within 12 hours or as directed by MD      Ok Edwards, PA-C 01/26/17 [place], Arav Bannister V, PA-C 01/26/17 1203

## 2017-01-26 NOTE — ED Triage Notes (Signed)
r    Shoulder   And  r  Upper  Back  Pain  X   3  Days      Denies   Any  Injury      Pt  Reports   Pain l   Side  Neck  Last  Night

## 2017-02-01 DIAGNOSIS — H2513 Age-related nuclear cataract, bilateral: Secondary | ICD-10-CM | POA: Diagnosis not present

## 2017-02-01 DIAGNOSIS — H40013 Open angle with borderline findings, low risk, bilateral: Secondary | ICD-10-CM | POA: Diagnosis not present

## 2017-02-01 DIAGNOSIS — H524 Presbyopia: Secondary | ICD-10-CM | POA: Diagnosis not present

## 2017-02-01 DIAGNOSIS — E119 Type 2 diabetes mellitus without complications: Secondary | ICD-10-CM | POA: Diagnosis not present

## 2017-02-14 ENCOUNTER — Other Ambulatory Visit: Payer: Self-pay | Admitting: Family Medicine

## 2017-02-14 DIAGNOSIS — Z1231 Encounter for screening mammogram for malignant neoplasm of breast: Secondary | ICD-10-CM

## 2017-02-15 DIAGNOSIS — E1121 Type 2 diabetes mellitus with diabetic nephropathy: Secondary | ICD-10-CM | POA: Diagnosis not present

## 2017-02-15 DIAGNOSIS — M5431 Sciatica, right side: Secondary | ICD-10-CM | POA: Diagnosis not present

## 2017-02-15 DIAGNOSIS — Z23 Encounter for immunization: Secondary | ICD-10-CM | POA: Diagnosis not present

## 2017-02-15 DIAGNOSIS — I129 Hypertensive chronic kidney disease with stage 1 through stage 4 chronic kidney disease, or unspecified chronic kidney disease: Secondary | ICD-10-CM | POA: Diagnosis not present

## 2017-02-15 DIAGNOSIS — E785 Hyperlipidemia, unspecified: Secondary | ICD-10-CM | POA: Diagnosis not present

## 2017-03-20 ENCOUNTER — Ambulatory Visit
Admission: RE | Admit: 2017-03-20 | Discharge: 2017-03-20 | Disposition: A | Discharge status: Home / Self Care | Payer: Medicare HMO | Source: Ambulatory Visit | Attending: Family Medicine | Admitting: Family Medicine

## 2017-03-20 DIAGNOSIS — Z1231 Encounter for screening mammogram for malignant neoplasm of breast: Secondary | ICD-10-CM | POA: Diagnosis not present

## 2017-07-04 ENCOUNTER — Encounter (HOSPITAL_COMMUNITY): Payer: Self-pay

## 2017-07-04 ENCOUNTER — Emergency Department (HOSPITAL_COMMUNITY): Payer: Medicare HMO

## 2017-07-04 ENCOUNTER — Other Ambulatory Visit: Payer: Self-pay

## 2017-07-04 ENCOUNTER — Emergency Department (HOSPITAL_COMMUNITY)
Admission: EM | Admit: 2017-07-04 | Discharge: 2017-07-04 | Disposition: A | Discharge status: Home / Self Care | Payer: Medicare HMO | Attending: Emergency Medicine | Admitting: Emergency Medicine

## 2017-07-04 DIAGNOSIS — Z7982 Long term (current) use of aspirin: Secondary | ICD-10-CM | POA: Diagnosis not present

## 2017-07-04 DIAGNOSIS — I10 Essential (primary) hypertension: Secondary | ICD-10-CM | POA: Insufficient documentation

## 2017-07-04 DIAGNOSIS — Z7984 Long term (current) use of oral hypoglycemic drugs: Secondary | ICD-10-CM | POA: Insufficient documentation

## 2017-07-04 DIAGNOSIS — J189 Pneumonia, unspecified organism: Secondary | ICD-10-CM | POA: Diagnosis not present

## 2017-07-04 DIAGNOSIS — F17210 Nicotine dependence, cigarettes, uncomplicated: Secondary | ICD-10-CM | POA: Insufficient documentation

## 2017-07-04 DIAGNOSIS — E119 Type 2 diabetes mellitus without complications: Secondary | ICD-10-CM | POA: Insufficient documentation

## 2017-07-04 DIAGNOSIS — Z79899 Other long term (current) drug therapy: Secondary | ICD-10-CM | POA: Diagnosis not present

## 2017-07-04 DIAGNOSIS — M546 Pain in thoracic spine: Secondary | ICD-10-CM | POA: Diagnosis not present

## 2017-07-04 DIAGNOSIS — R05 Cough: Secondary | ICD-10-CM | POA: Diagnosis not present

## 2017-07-04 DIAGNOSIS — J181 Lobar pneumonia, unspecified organism: Secondary | ICD-10-CM

## 2017-07-04 LAB — URINALYSIS, ROUTINE W REFLEX MICROSCOPIC
Bilirubin Urine: NEGATIVE
Glucose, UA: NEGATIVE mg/dL
Ketones, ur: 5 mg/dL — AB
Leukocytes, UA: NEGATIVE
Nitrite: NEGATIVE
Protein, ur: 30 mg/dL — AB
Specific Gravity, Urine: 1.027 (ref 1.005–1.030)
pH: 5 (ref 5.0–8.0)

## 2017-07-04 MED ORDER — ALBUTEROL SULFATE HFA 108 (90 BASE) MCG/ACT IN AERS: 1.0000 | INHALATION_SPRAY | Freq: Four times a day (QID) | RESPIRATORY_TRACT | 0 refills | Status: DC | PRN

## 2017-07-04 MED ORDER — BENZONATATE 100 MG PO CAPS: 100.0000 mg | ORAL_CAPSULE | Freq: Three times a day (TID) | ORAL | 0 refills | Status: DC | PRN

## 2017-07-04 MED ORDER — AMOXICILLIN-POT CLAVULANATE 875-125 MG PO TABS: 1.0000 | ORAL_TABLET | Freq: Two times a day (BID) | ORAL | 0 refills | Status: AC

## 2017-07-04 MED ORDER — IBUPROFEN 200 MG PO TABS
600.0000 mg | ORAL_TABLET | Freq: Once | ORAL | Status: AC
  Administered 2017-07-04: 600 mg via ORAL
  Filled 2017-07-04: qty 1

## 2017-07-04 MED ORDER — ACETAMINOPHEN 500 MG PO TABS
1000.0000 mg | ORAL_TABLET | Freq: Once | ORAL | Status: AC
  Administered 2017-07-04: 1000 mg via ORAL
  Filled 2017-07-04: qty 2

## 2017-07-04 MED ORDER — DOXYCYCLINE HYCLATE 100 MG PO CAPS: 100.0000 mg | ORAL_CAPSULE | Freq: Two times a day (BID) | ORAL | 0 refills | Status: AC

## 2017-07-04 NOTE — Discharge Instructions (Signed)
You have low-grade pneumonia to both of your lower lungs. Take antibiotics as prescribed. Tessalon Perles are to help with the cough, this medicine can cause drowsiness so careful with driving and using heavy machinery. Use albuterol inhaler for associated chest tightness and cough. Return for worsening symptoms, shortness of breath, chest pain, fevers, increased work of breathing.

## 2017-07-04 NOTE — ED Provider Notes (Signed)
[facility] EMERGENCY DEPARTMENT Provider Note   CSN: [id number] Arrival date & time: 07/04/17  1129     History   Chief Complaint Chief Complaint  Patient presents with  . Back Pain    HPI [name] Houston is a 62 y.o. female with history of hypertension and diabetes on oral medications is here for evaluation of thoracic back pain for 4 days. Pain is described as "gas moving through my back". Pain actually began in the left lower scapular area that is now bilateral. Pain is worse with movement. Has taken ibuprofen and use aspirin cream without significant relief. She took her husband's oxycodone which seemed to help. Other associated symptoms include intermittently productive cough for 3 days. She denies fevers, chills, exertional or pleuritic chest pain or shortness of breath, abdominal pain, nausea, vomiting, changes in bowel movements, urinary symptoms. Does not get her menstrual cycles anymore. No h/o kidney stones. Denies history of DVT/PE, malignancy, recent travel, immobilization, surgery, leg or calf pain or swelling. No estrogen use. HPI  Past Medical History:  Diagnosis Date  . Diabetes mellitus without complication ([place])   . Hiatal hernia 2015   DG Esophagus   . Hyperlipidemia   . Hypertension   . Kidney disease 2013    Patient Active Problem List   Diagnosis Date Noted  . DIABETES MELLITUS, TYPE II 08/23/2006  . HYPERTENSION 08/23/2006  . INFECTION DUE TO INTERNAL JOINT PROSTHESIS 08/23/2006    Past Surgical History:  Procedure Laterality Date  . BREAST ABSESS DRAINED  7/08  . CHOLECYSTECTOMY    . ORIF TIBIA PLATEAU  3/09    OB History    No data available       Home Medications    Prior to Admission medications   Medication Sig Start Date End Date Taking? Authorizing Provider  albuterol (PROVENTIL HFA;VENTOLIN HFA) 108 (90 Base) MCG/ACT inhaler Inhale 1-2 puffs into the lungs every 6 (six) hours as needed for wheezing or shortness of  breath. 07/04/17   Kinnie Feil, PA-C  amLODipine (NORVASC) 10 MG tablet Take 10 mg by mouth daily.    [provider]  amoxicillin-clavulanate (AUGMENTIN) 875-125 MG tablet Take 1 tablet by mouth every 12 (twelve) hours for 5 days. 07/04/17 07/09/17  Kinnie Feil, PA-C  aspirin 81 MG tablet Take 81 mg by mouth daily.    [provider]  benzonatate (TESSALON PERLES) 100 MG capsule Take 1 capsule (100 mg total) by mouth 3 (three) times daily as needed for cough. FOR DISRUPTIVE DAY TIME COUGH 07/04/17   Kinnie Feil, PA-C  calcium carbonate (OS-CAL) 600 MG TABS tablet Take 600 mg by mouth 2 (two) times daily with a meal.    [provider]  cyclobenzaprine (FLEXERIL) 10 MG tablet Take 0.5-1 tablets (5-10 mg total) by mouth at bedtime as needed for muscle spasms. 01/26/17   Tasia Catchings, Amy V, PA-C  doxycycline (VIBRAMYCIN) 100 MG capsule Take 1 capsule (100 mg total) by mouth 2 (two) times daily for 5 days. 07/04/17 07/09/17  Kinnie Feil, PA-C  lidocaine (LIDODERM) 5 % Place 1 patch onto the skin daily. Remove & Discard patch within 12 hours or as directed by MD 01/26/17   Ok Edwards, PA-C  lisinopril-hydrochlorothiazide (PRINZIDE,ZESTORETIC) 20-25 MG per tablet Take 1 tablet by mouth daily.    [provider]  metFORMIN (GLUMETZA) 500 MG (MOD) 24 hr tablet Take 500 mg by mouth.    [provider]  omeprazole ([place])  20 MG capsule Take 1 capsule (20 mg total) by mouth daily. 07/09/13   Serita Grit, MD  pravastatin (PRAVACHOL) 40 MG tablet Take 40 mg by mouth daily.    [provider]  traMADol (ULTRAM) 50 MG tablet Take 2 tablets (100 mg total) by mouth every 8 (eight) hours as needed for pain. 08/04/12   Harden Mo, MD    Family History Family History  Problem Relation Age of Onset  . Hypertension Mother   . Diabetes Mother   . CVA Father   . Hypertension Father   . Diabetes Father   . Pancreatic cancer Father   . Pulmonary  embolism Father   . Bone cancer Maternal Aunt   . Diabetes Paternal Grandmother     Social History Social History   Tobacco Use  . Smoking status: Heavy Tobacco Smoker    Packs/day: 0.50    Types: Cigarettes  Substance Use Topics  . Alcohol use: Yes    Comment: occ  . Drug use: No     Allergies   Patient has no known allergies.   Review of Systems Review of Systems  Respiratory: Positive for cough.   Musculoskeletal: Positive for back pain (thoracic).  All other systems reviewed and are negative.    Physical Exam Updated Vital Signs BP 122/65   Pulse 82   Temp 99.7 F (37.6 C) (Oral)   Resp 20   Ht 5\' 4"  (1.626 m)   Wt 102.1 kg (225 lb)   LMP 01/18/2011   SpO2 98%   BMI 38.62 kg/m   Physical Exam  Constitutional: She is oriented to person, place, and time. She appears well-developed and well-nourished. No distress.  Non toxic  HENT:  Head: Normocephalic and atraumatic.  Nose: Nose normal.  Mouth/Throat: No oropharyngeal exudate.  Moist mucous membranes   Eyes: Conjunctivae and EOM are normal. Pupils are equal, round, and reactive to light.  Neck: Normal range of motion.  Cardiovascular: Normal rate, regular rhythm and intact distal pulses.  No murmur heard. 2+ DP and radial pulses bilaterally. No LE edema.   Pulmonary/Chest: Effort normal. No respiratory distress. She has decreased breath sounds in the right lower field and the left lower field. She has no wheezes. She has no rales.  Decreased breath sounds to lower lobes bilaterally. No chest wall tenderness. No rash or skin changes to thorax.   Abdominal: Soft. Bowel sounds are normal. There is no tenderness.  No G/R/R. No suprapubic or CVA tenderness.   Musculoskeletal: Normal range of motion. She exhibits no deformity.  Neurological: She is alert and oriented to person, place, and time.  Skin: Skin is warm and dry. Capillary refill takes less than 2 seconds.  Psychiatric: She has a normal mood and  affect. Her behavior is normal. Judgment and thought content normal.  Nursing note and vitals reviewed.    ED Treatments / Results  Labs (all labs ordered are listed, but only abnormal results are displayed) Labs Reviewed  URINALYSIS, ROUTINE W REFLEX MICROSCOPIC - Abnormal; Notable for the following components:      Result Value   APPearance HAZY (*)    Hgb urine dipstick MODERATE (*)    Ketones, ur 5 (*)    Protein, ur 30 (*)    Bacteria, UA FEW (*)    Squamous Epithelial / LPF 0-5 (*)    All other components within normal limits  URINE CULTURE    EKG  EKG Interpretation None  Radiology Dg Chest 2 View  Result Date: 07/04/2017 CLINICAL DATA:  Acute low back pain.  Productive cough. EXAM: CHEST - 2 VIEW COMPARISON:  07/10/2007 FINDINGS: Heart size at the upper limits of normal. Aortic atherosclerosis. Abnormal density in both lower lobes posteriorly, right more than left, consistent with mild basilar pneumonia. No visible effusion. Ordinary degenerative changes affect the spine. IMPRESSION: Abnormal density and volume loss in both lower lobes posteriorly right more than left consistent with bibasilar pneumonia. Electronically Signed   By: Nelson Chimes M.D.   On: 07/04/2017 13:10    Procedures Procedures (including critical care time)  Medications Ordered in ED Medications  acetaminophen (TYLENOL) tablet 1,000 mg (1,000 mg Oral Given 07/04/17 1426)  ibuprofen (ADVIL,MOTRIN) tablet 600 mg (600 mg Oral Given 07/04/17 1426)     Initial Impression / Assessment and Plan / ED Course  I have reviewed the triage vital signs and the nursing notes.  Pertinent labs & imaging results that were available during my care of the patient were reviewed by me and considered in my medical decision making (see chart for details).  Clinical Course as of Jul 04 1528  Wed Jul 04, 2017  1340 Hgb urine dipstick: (!) MODERATE [CG]  1341 RBC / HPF: 6-30 [CG]  1341 FINDINGS: Heart size at  the upper limits of normal. Aortic atherosclerosis. Abnormal density in both lower lobes posteriorly, right more than left, consistent with mild basilar pneumonia. No visible effusion. Ordinary degenerative changes affect the spine.  IMPRESSION: Abnormal density and volume loss in both lower lobes posteriorly right more than left consistent with bibasilar pneumonia. DG Chest 2 View [CG]    Clinical Course User Index [CG] Kinnie Feil, PA-C   62 y.o. yo female with pertinent pmh of diabetes and tobacco abuse presents to ED with cough, sputum production and thoracic pain.  On physical examination,VS are within normal limits with no fever, tachypnea , hypoxia or tachycardia.  Patient is non toxic appearing, speaking in full sentences with no signs of respiratory distress.  Ambulated with normal oxygen saturations. Decreased breath sounds in lower lobes on exam.  CXR shows bil basilar PNA consistent with clinical signs and exam.  Given reassuring vital signs lab work not indicated at this time.  No indication for emergent lab work given reassuring exam, VS. Will discharge patient with augmentin/doxy, albuterol and symptomatic management. Encouraged to f/u with PCP in 2-3 days for re-evaluation.  Patient verbalized understanding and is agreeable to discharge plan.  Strict ED return precautions given, patient is aware of red flag symptoms to monitor for that would warrant return to ED for further evaluation.  Vital signs were within normal limits prior to discharge.  Pt shared with SP.   Final Clinical Impressions(s) / ED Diagnoses   Final diagnoses:  Community acquired pneumonia of right lower lobe of lung [facility])    ED Discharge Orders        Ordered    amoxicillin-clavulanate (AUGMENTIN) 875-125 MG tablet  Every 12 hours     07/04/17 1510    albuterol (PROVENTIL HFA;VENTOLIN HFA) 108 (90 Base) MCG/ACT inhaler  Every 6 hours PRN     07/04/17 1510    benzonatate (TESSALON PERLES) 100 MG  capsule  3 times daily PRN     07/04/17 1510    doxycycline (VIBRAMYCIN) 100 MG capsule  2 times daily     07/04/17 1510       Kinnie Feil, [place] 07/04/17 1530  Daleen Bo, MD 07/04/17 2222

## 2017-07-04 NOTE — ED Notes (Signed)
Pulse oxygenation consitent between 98-99% on room air.

## 2017-07-04 NOTE — ED Notes (Signed)
Patient transported to X-ray 

## 2017-07-04 NOTE — ED Triage Notes (Addendum)
Pt endorses lower bilateral back pain since Sunday night, worse with movement. Denies urinary sx. Also endorses slight cough with "a little phlem" VSS.

## 2017-07-04 NOTE — ED Notes (Signed)
ED Provider at bedside. 

## 2017-07-05 LAB — URINE CULTURE: Culture: <10000 — AB

## 2017-07-06 DIAGNOSIS — J189 Pneumonia, unspecified organism: Secondary | ICD-10-CM | POA: Diagnosis not present

## 2017-08-03 ENCOUNTER — Other Ambulatory Visit: Payer: Self-pay | Admitting: Family Medicine

## 2017-08-03 ENCOUNTER — Ambulatory Visit
Admission: RE | Admit: 2017-08-03 | Discharge: 2017-08-03 | Disposition: A | Discharge status: Home / Self Care | Payer: Medicare HMO | Source: Ambulatory Visit | Attending: Family Medicine | Admitting: Family Medicine

## 2017-08-03 DIAGNOSIS — R05 Cough: Secondary | ICD-10-CM | POA: Diagnosis not present

## 2017-08-03 DIAGNOSIS — J189 Pneumonia, unspecified organism: Secondary | ICD-10-CM

## 2017-09-06 DIAGNOSIS — E785 Hyperlipidemia, unspecified: Secondary | ICD-10-CM | POA: Diagnosis not present

## 2017-09-06 DIAGNOSIS — Z1389 Encounter for screening for other disorder: Secondary | ICD-10-CM | POA: Diagnosis not present

## 2017-09-06 DIAGNOSIS — D649 Anemia, unspecified: Secondary | ICD-10-CM | POA: Diagnosis not present

## 2017-09-06 DIAGNOSIS — E1169 Type 2 diabetes mellitus with other specified complication: Secondary | ICD-10-CM | POA: Diagnosis not present

## 2017-09-06 DIAGNOSIS — K219 Gastro-esophageal reflux disease without esophagitis: Secondary | ICD-10-CM | POA: Diagnosis not present

## 2017-09-06 DIAGNOSIS — Z0000 Encounter for general adult medical examination without abnormal findings: Secondary | ICD-10-CM | POA: Diagnosis not present

## 2017-09-06 DIAGNOSIS — Z79899 Other long term (current) drug therapy: Secondary | ICD-10-CM | POA: Diagnosis not present

## 2017-09-06 DIAGNOSIS — Z23 Encounter for immunization: Secondary | ICD-10-CM | POA: Diagnosis not present

## 2018-02-07 DIAGNOSIS — E119 Type 2 diabetes mellitus without complications: Secondary | ICD-10-CM | POA: Diagnosis not present

## 2018-02-07 DIAGNOSIS — H5203 Hypermetropia, bilateral: Secondary | ICD-10-CM | POA: Diagnosis not present

## 2018-02-07 DIAGNOSIS — H2513 Age-related nuclear cataract, bilateral: Secondary | ICD-10-CM | POA: Diagnosis not present

## 2018-02-07 DIAGNOSIS — H40013 Open angle with borderline findings, low risk, bilateral: Secondary | ICD-10-CM | POA: Diagnosis not present

## 2018-02-07 DIAGNOSIS — H524 Presbyopia: Secondary | ICD-10-CM | POA: Diagnosis not present

## 2018-02-07 DIAGNOSIS — Z7984 Long term (current) use of oral hypoglycemic drugs: Secondary | ICD-10-CM | POA: Diagnosis not present

## 2018-02-08 ENCOUNTER — Other Ambulatory Visit: Payer: Self-pay | Admitting: Family Medicine

## 2018-02-08 DIAGNOSIS — Z1231 Encounter for screening mammogram for malignant neoplasm of breast: Secondary | ICD-10-CM

## 2018-03-14 DIAGNOSIS — E785 Hyperlipidemia, unspecified: Secondary | ICD-10-CM | POA: Diagnosis not present

## 2018-03-14 DIAGNOSIS — Z23 Encounter for immunization: Secondary | ICD-10-CM | POA: Diagnosis not present

## 2018-03-14 DIAGNOSIS — I10 Essential (primary) hypertension: Secondary | ICD-10-CM | POA: Diagnosis not present

## 2018-03-14 DIAGNOSIS — R05 Cough: Secondary | ICD-10-CM | POA: Diagnosis not present

## 2018-03-14 DIAGNOSIS — E1169 Type 2 diabetes mellitus with other specified complication: Secondary | ICD-10-CM | POA: Diagnosis not present

## 2018-03-14 DIAGNOSIS — E119 Type 2 diabetes mellitus without complications: Secondary | ICD-10-CM | POA: Diagnosis not present

## 2018-03-14 DIAGNOSIS — E1122 Type 2 diabetes mellitus with diabetic chronic kidney disease: Secondary | ICD-10-CM | POA: Diagnosis not present

## 2018-03-22 ENCOUNTER — Ambulatory Visit
Admission: RE | Admit: 2018-03-22 | Discharge: 2018-03-22 | Disposition: A | Discharge status: Home / Self Care | Payer: Medicare HMO | Source: Ambulatory Visit | Attending: Family Medicine | Admitting: Family Medicine

## 2018-03-22 DIAGNOSIS — Z1231 Encounter for screening mammogram for malignant neoplasm of breast: Secondary | ICD-10-CM | POA: Diagnosis not present

## 2018-04-04 DIAGNOSIS — N289 Disorder of kidney and ureter, unspecified: Secondary | ICD-10-CM | POA: Diagnosis not present

## 2018-05-02 DIAGNOSIS — N289 Disorder of kidney and ureter, unspecified: Secondary | ICD-10-CM | POA: Diagnosis not present

## 2018-05-02 DIAGNOSIS — N183 Chronic kidney disease, stage 3 (moderate): Secondary | ICD-10-CM | POA: Diagnosis not present

## 2018-10-21 DIAGNOSIS — E785 Hyperlipidemia, unspecified: Secondary | ICD-10-CM | POA: Diagnosis not present

## 2018-10-21 DIAGNOSIS — Z0000 Encounter for general adult medical examination without abnormal findings: Secondary | ICD-10-CM | POA: Diagnosis not present

## 2018-10-21 DIAGNOSIS — I129 Hypertensive chronic kidney disease with stage 1 through stage 4 chronic kidney disease, or unspecified chronic kidney disease: Secondary | ICD-10-CM | POA: Diagnosis not present

## 2018-10-21 DIAGNOSIS — E1169 Type 2 diabetes mellitus with other specified complication: Secondary | ICD-10-CM | POA: Diagnosis not present

## 2018-10-21 DIAGNOSIS — Z1389 Encounter for screening for other disorder: Secondary | ICD-10-CM | POA: Diagnosis not present

## 2018-10-21 DIAGNOSIS — Z79899 Other long term (current) drug therapy: Secondary | ICD-10-CM | POA: Diagnosis not present

## 2018-10-21 DIAGNOSIS — K219 Gastro-esophageal reflux disease without esophagitis: Secondary | ICD-10-CM | POA: Diagnosis not present

## 2018-10-21 DIAGNOSIS — N183 Chronic kidney disease, stage 3 (moderate): Secondary | ICD-10-CM | POA: Diagnosis not present

## 2018-10-22 ENCOUNTER — Other Ambulatory Visit: Payer: Self-pay | Admitting: Family Medicine

## 2018-10-22 DIAGNOSIS — E2839 Other primary ovarian failure: Secondary | ICD-10-CM

## 2018-10-22 DIAGNOSIS — Z1231 Encounter for screening mammogram for malignant neoplasm of breast: Secondary | ICD-10-CM

## 2018-11-20 DIAGNOSIS — N183 Chronic kidney disease, stage 3 (moderate): Secondary | ICD-10-CM | POA: Diagnosis not present

## 2018-11-20 DIAGNOSIS — Z79899 Other long term (current) drug therapy: Secondary | ICD-10-CM | POA: Diagnosis not present

## 2018-11-20 DIAGNOSIS — E1169 Type 2 diabetes mellitus with other specified complication: Secondary | ICD-10-CM | POA: Diagnosis not present

## 2018-11-20 DIAGNOSIS — M85871 Other specified disorders of bone density and structure, right ankle and foot: Secondary | ICD-10-CM | POA: Diagnosis not present

## 2018-11-20 DIAGNOSIS — K219 Gastro-esophageal reflux disease without esophagitis: Secondary | ICD-10-CM | POA: Diagnosis not present

## 2019-02-13 DIAGNOSIS — H5203 Hypermetropia, bilateral: Secondary | ICD-10-CM | POA: Diagnosis not present

## 2019-02-13 DIAGNOSIS — E119 Type 2 diabetes mellitus without complications: Secondary | ICD-10-CM | POA: Diagnosis not present

## 2019-02-13 DIAGNOSIS — H40013 Open angle with borderline findings, low risk, bilateral: Secondary | ICD-10-CM | POA: Diagnosis not present

## 2019-02-13 DIAGNOSIS — H2513 Age-related nuclear cataract, bilateral: Secondary | ICD-10-CM | POA: Diagnosis not present

## 2019-02-13 DIAGNOSIS — H524 Presbyopia: Secondary | ICD-10-CM | POA: Diagnosis not present

## 2019-03-10 ENCOUNTER — Other Ambulatory Visit: Payer: Self-pay | Admitting: Family Medicine

## 2019-03-10 ENCOUNTER — Other Ambulatory Visit (HOSPITAL_COMMUNITY)
Admission: RE | Admit: 2019-03-10 | Discharge: 2019-03-10 | Disposition: A | Discharge status: Home / Self Care | Payer: Medicare HMO | Source: Ambulatory Visit | Attending: Family Medicine | Admitting: Family Medicine

## 2019-03-10 DIAGNOSIS — N183 Chronic kidney disease, stage 3 unspecified: Secondary | ICD-10-CM | POA: Diagnosis not present

## 2019-03-10 DIAGNOSIS — I129 Hypertensive chronic kidney disease with stage 1 through stage 4 chronic kidney disease, or unspecified chronic kidney disease: Secondary | ICD-10-CM | POA: Diagnosis not present

## 2019-03-10 DIAGNOSIS — K219 Gastro-esophageal reflux disease without esophagitis: Secondary | ICD-10-CM | POA: Diagnosis not present

## 2019-03-10 DIAGNOSIS — Z124 Encounter for screening for malignant neoplasm of cervix: Secondary | ICD-10-CM | POA: Diagnosis not present

## 2019-03-10 DIAGNOSIS — E1122 Type 2 diabetes mellitus with diabetic chronic kidney disease: Secondary | ICD-10-CM | POA: Diagnosis not present

## 2019-03-10 DIAGNOSIS — E1169 Type 2 diabetes mellitus with other specified complication: Secondary | ICD-10-CM | POA: Diagnosis not present

## 2019-03-10 DIAGNOSIS — E785 Hyperlipidemia, unspecified: Secondary | ICD-10-CM | POA: Diagnosis not present

## 2019-03-12 LAB — CYTOLOGY - PAP
Comment: NEGATIVE
Diagnosis: NEGATIVE
High risk HPV: NEGATIVE

## 2019-05-07 DIAGNOSIS — H524 Presbyopia: Secondary | ICD-10-CM | POA: Diagnosis not present

## 2019-05-16 ENCOUNTER — Ambulatory Visit
Admission: RE | Admit: 2019-05-16 | Discharge: 2019-05-16 | Disposition: A | Discharge status: Home / Self Care | Payer: Medicare HMO | Source: Ambulatory Visit | Attending: Family Medicine | Admitting: Family Medicine

## 2019-05-16 ENCOUNTER — Other Ambulatory Visit: Payer: Self-pay

## 2019-05-16 DIAGNOSIS — E2839 Other primary ovarian failure: Secondary | ICD-10-CM

## 2019-05-16 DIAGNOSIS — Z1231 Encounter for screening mammogram for malignant neoplasm of breast: Secondary | ICD-10-CM | POA: Diagnosis not present

## 2019-05-16 DIAGNOSIS — Z780 Asymptomatic menopausal state: Secondary | ICD-10-CM | POA: Diagnosis not present

## 2019-06-11 DIAGNOSIS — E1169 Type 2 diabetes mellitus with other specified complication: Secondary | ICD-10-CM | POA: Diagnosis not present

## 2019-06-11 DIAGNOSIS — E785 Hyperlipidemia, unspecified: Secondary | ICD-10-CM | POA: Diagnosis not present

## 2019-06-11 DIAGNOSIS — I129 Hypertensive chronic kidney disease with stage 1 through stage 4 chronic kidney disease, or unspecified chronic kidney disease: Secondary | ICD-10-CM | POA: Diagnosis not present

## 2019-06-11 DIAGNOSIS — N183 Chronic kidney disease, stage 3 unspecified: Secondary | ICD-10-CM | POA: Diagnosis not present

## 2020-03-08 DIAGNOSIS — Z0000 Encounter for general adult medical examination without abnormal findings: Secondary | ICD-10-CM | POA: Diagnosis not present

## 2020-03-08 DIAGNOSIS — E785 Hyperlipidemia, unspecified: Secondary | ICD-10-CM | POA: Diagnosis not present

## 2020-03-08 DIAGNOSIS — E1169 Type 2 diabetes mellitus with other specified complication: Secondary | ICD-10-CM | POA: Diagnosis not present

## 2020-03-08 DIAGNOSIS — Z79899 Other long term (current) drug therapy: Secondary | ICD-10-CM | POA: Diagnosis not present

## 2020-03-08 DIAGNOSIS — N183 Chronic kidney disease, stage 3 unspecified: Secondary | ICD-10-CM | POA: Diagnosis not present

## 2020-03-08 DIAGNOSIS — Z1159 Encounter for screening for other viral diseases: Secondary | ICD-10-CM | POA: Diagnosis not present

## 2020-03-08 DIAGNOSIS — I129 Hypertensive chronic kidney disease with stage 1 through stage 4 chronic kidney disease, or unspecified chronic kidney disease: Secondary | ICD-10-CM | POA: Diagnosis not present

## 2020-03-08 DIAGNOSIS — I700 Atherosclerosis of aorta: Secondary | ICD-10-CM | POA: Diagnosis not present

## 2020-03-11 DIAGNOSIS — E1136 Type 2 diabetes mellitus with diabetic cataract: Secondary | ICD-10-CM | POA: Diagnosis not present

## 2020-03-11 DIAGNOSIS — H524 Presbyopia: Secondary | ICD-10-CM | POA: Diagnosis not present

## 2020-03-11 DIAGNOSIS — H5203 Hypermetropia, bilateral: Secondary | ICD-10-CM | POA: Diagnosis not present

## 2020-03-11 DIAGNOSIS — H2513 Age-related nuclear cataract, bilateral: Secondary | ICD-10-CM | POA: Diagnosis not present

## 2020-03-11 DIAGNOSIS — H40013 Open angle with borderline findings, low risk, bilateral: Secondary | ICD-10-CM | POA: Diagnosis not present

## 2020-03-18 DIAGNOSIS — H524 Presbyopia: Secondary | ICD-10-CM | POA: Diagnosis not present

## 2020-03-18 DIAGNOSIS — H52221 Regular astigmatism, right eye: Secondary | ICD-10-CM | POA: Diagnosis not present

## 2020-05-18 ENCOUNTER — Other Ambulatory Visit: Payer: Self-pay | Admitting: Family Medicine

## 2020-05-18 DIAGNOSIS — Z1231 Encounter for screening mammogram for malignant neoplasm of breast: Secondary | ICD-10-CM

## 2020-07-05 ENCOUNTER — Ambulatory Visit
Admission: RE | Admit: 2020-07-05 | Discharge: 2020-07-05 | Disposition: A | Discharge status: Home / Self Care | Payer: Self-pay | Source: Ambulatory Visit | Attending: Family Medicine | Admitting: Family Medicine

## 2020-07-05 ENCOUNTER — Other Ambulatory Visit: Payer: Self-pay

## 2020-07-05 DIAGNOSIS — Z1231 Encounter for screening mammogram for malignant neoplasm of breast: Secondary | ICD-10-CM | POA: Diagnosis not present

## 2020-09-14 ENCOUNTER — Ambulatory Visit (HOSPITAL_COMMUNITY): Payer: HMO

## 2020-09-14 ENCOUNTER — Ambulatory Visit (HOSPITAL_COMMUNITY)
Admission: EM | Admit: 2020-09-14 | Discharge: 2020-09-14 | Disposition: A | Discharge status: Home / Self Care | Payer: HMO | Attending: Family Medicine | Admitting: Family Medicine

## 2020-09-14 ENCOUNTER — Other Ambulatory Visit: Payer: Self-pay

## 2020-09-14 ENCOUNTER — Encounter (HOSPITAL_COMMUNITY): Payer: Self-pay

## 2020-09-14 DIAGNOSIS — S82831A Other fracture of upper and lower end of right fibula, initial encounter for closed fracture: Secondary | ICD-10-CM | POA: Diagnosis not present

## 2020-09-14 DIAGNOSIS — M7989 Other specified soft tissue disorders: Secondary | ICD-10-CM | POA: Diagnosis not present

## 2020-09-14 MED ORDER — OXYCODONE HCL 5 MG PO TABS
5.0000 mg | ORAL_TABLET | Freq: Four times a day (QID) | ORAL | 0 refills | Status: AC | PRN
Start: 2020-09-14 — End: 2020-09-17

## 2020-09-14 NOTE — ED Provider Notes (Signed)
[place]    CSN: [id number] Arrival date & time: 09/14/20  1222      History   Chief Complaint Chief Complaint  Patient presents with  . Ankle Pain    HPI [name] Houston is a 65 y.o. female.   HPI  Pt reports pain and swelling in the right ankle x 3 days after twisted the right ankle.   Pleasant patient with history of right distal fibula fracture (2007) and left tibial plateau fracture (2009), as well as osteoporosis, presents to urgent care after injuring her right ankle 3 days ago.  Patient reports that on Sunday, 09/12/2020 she took a big step backwards off of her porch quickly and felt a sudden pain in her right ankle.  Patient reports that directly after she took a step and felt the pain she was unable to bear weight on the right foot/ankle.  Since then she has developed significant swelling to the right ankle.  She has been soaking her foot in warm water, will elevate and apply a cool compress.  She has also tried Tylenol and has some leftover oxycodone from a previous prescription she has been taking to reduce her pain.  No concerns today for fevers, shortness of breath, nausea, vomiting, abdominal pain, upper respiratory symptoms, focal deficit.  No concern for loss of consciousness causing the accident.  Past Medical History:  Diagnosis Date  . Diabetes mellitus without complication ([place])   . Hiatal hernia 2015   DG Esophagus   . Hyperlipidemia   . Hypertension   . Kidney disease 2013    Patient Active Problem List   Diagnosis Date Noted  . DIABETES MELLITUS, TYPE II 08/23/2006  . HYPERTENSION 08/23/2006  . INFECTION DUE TO INTERNAL JOINT PROSTHESIS 08/23/2006    Past Surgical History:  Procedure Laterality Date  . BREAST ABSESS DRAINED  7/08  . CHOLECYSTECTOMY    . ORIF TIBIA PLATEAU  3/09    OB History   No obstetric history on file.      Home Medications    Prior to Admission medications   Medication Sig Start Date End Date  Taking? Authorizing Provider  oxyCODONE (OXY IR/ROXICODONE) 5 MG immediate release tablet Take 1 tablet (5 mg total) by mouth every 6 (six) hours as needed for up to 3 days for severe pain. 09/14/20 09/17/20 Yes Milus Banister C, DO  albuterol (PROVENTIL HFA;VENTOLIN HFA) 108 (90 Base) MCG/ACT inhaler Inhale 1-2 puffs into the lungs every 6 (six) hours as needed for wheezing or shortness of breath. 07/04/17   Kinnie Feil, PA-C  amLODipine (NORVASC) 10 MG tablet Take 10 mg by mouth daily.    [provider]  aspirin 81 MG tablet Take 81 mg by mouth daily.    [provider]  benzonatate (TESSALON PERLES) 100 MG capsule Take 1 capsule (100 mg total) by mouth 3 (three) times daily as needed for cough. FOR DISRUPTIVE DAY TIME COUGH 07/04/17   Kinnie Feil, PA-C  calcium carbonate (OS-CAL) 600 MG TABS tablet Take 600 mg by mouth 2 (two) times daily with a meal.    [provider]  cyclobenzaprine (FLEXERIL) 10 MG tablet Take 0.5-1 tablets (5-10 mg total) by mouth at bedtime as needed for muscle spasms. 01/26/17   Tasia Catchings, Amy V, PA-C  glipiZIDE (GLUCOTROL) 5 MG tablet Take 5 mg by mouth 2 (two) times daily. 07/17/20   [provider]  lidocaine (LIDODERM) 5 % Place 1 patch onto the skin daily.  Remove & Discard patch within 12 hours or as directed by MD 01/26/17   Ok Edwards, PA-C  lisinopril-hydrochlorothiazide (PRINZIDE,ZESTORETIC) 20-25 MG per tablet Take 1 tablet by mouth daily.    [provider]  metFORMIN (GLUMETZA) 500 MG (MOD) 24 hr tablet Take 500 mg by mouth.    [provider]  omeprazole (PRILOSEC) 20 MG capsule Take 1 capsule (20 mg total) by mouth daily. 07/09/13   Serita Grit, MD  pravastatin (PRAVACHOL) 40 MG tablet Take 40 mg by mouth daily.    [provider]    Family History Family History  Problem Relation Age of Onset  . Hypertension Mother   . Diabetes Mother   . CVA Father   . Hypertension Father   . Diabetes  Father   . Pancreatic cancer Father   . Pulmonary embolism Father   . Bone cancer Maternal Aunt   . Diabetes Paternal Grandmother     Social History Social History   Tobacco Use  . Smoking status: Heavy Tobacco Smoker    Packs/day: 0.50    Types: Cigarettes  . Smokeless tobacco: Never Used  Substance Use Topics  . Alcohol use: Yes    Comment: occ  . Drug use: No     Allergies   Patient has no known allergies.   Review of Systems Review of Systems - See HPI   Physical Exam Triage Vital Signs ED Triage Vitals  Enc Vitals Group     BP 09/14/20 1334 (!) 152/78     Pulse Rate 09/14/20 1334 72     Resp 09/14/20 1334 18     Temp 09/14/20 1334 98.4 F (36.9 C)     Temp Source 09/14/20 1334 Oral     SpO2 09/14/20 1334 100 %     Weight --      Height --      Head Circumference --      Peak Flow --      Pain Score 09/14/20 1324 8     Pain Loc --      Pain Edu? --      Excl. in [place]? --    No data found.  Updated Vital Signs BP (!) 152/78 (BP Location: Left Arm)   Pulse 72   Temp 98.4 F (36.9 C) (Oral)   Resp 18   LMP 01/18/2011   SpO2 100%   Physical Exam Constitutional:      Appearance: Normal appearance. She is not ill-appearing.  HENT:     Head: Normocephalic.     Nose: Nose normal. No congestion.     Mouth/Throat:     Mouth: Mucous membranes are moist.  Cardiovascular:     Rate and Rhythm: Normal rate and regular rhythm.     Heart sounds: Normal heart sounds.  Pulmonary:     Effort: Pulmonary effort is normal. No respiratory distress.     Breath sounds: Normal breath sounds.  Abdominal:     General: Bowel sounds are normal.  Musculoskeletal:        General: Swelling (RLE), tenderness (lateral RLE) and signs of injury (lateral RLE) present.     Cervical back: Normal range of motion.  Skin:    General: Skin is warm.     Capillary Refill: Capillary refill takes less than 2 seconds.  Neurological:     General: No focal deficit present.      Gait: Gait abnormal (2/2 R ankle fracture).    UC Treatments / Results  Labs (all labs ordered are listed, but only abnormal results are displayed) Labs Reviewed - No data to display  EKG  Radiology DG Ankle Complete Right  Result Date: 09/14/2020 CLINICAL DATA:  Pain and swelling following twisting injury EXAM: RIGHT ANKLE - COMPLETE 3+ VIEW COMPARISON:  February 15, 2007 FINDINGS: Frontal, oblique, and lateral views were obtained. There is an obliquely oriented fracture in the distal fibular diaphysis with alignment essentially anatomic in this area. No other fracture evident. There is soft tissue swelling laterally. There is osteoarthritic change in the lateral aspect of the ankle joint. There is underlying osteoporosis. No erosions. Ankle mortise appears grossly intact. There are posterior and inferior calcaneal spurs. There are foci of arterial vascular calcification at several sites. IMPRESSION: Obliquely oriented fracture distal fibular diaphysis with alignment near anatomic. Underlying osteoporosis. There is osteoarthritic change. Calcaneal spurs noted. Multiple foci of arterial vascular calcification noted. These results will be called to the ordering clinician or representative by the Radiologist Assistant, and communication documented in the PACS or [employer]. Electronically Signed   By: Lowella Grip III M.D.   On: 09/14/2020 14:36    Procedures Procedures (including critical care time)  Medications Ordered in UC Medications - No data to display  Initial Impression / Assessment and Plan / UC Course  I have reviewed the triage vital signs and the nursing notes.  Pertinent labs & imaging results that were available during my care of the patient were reviewed by me and considered in my medical decision making (see chart for details).   Fibula fracture, right: Patient with acute oblique nondisplaced fracture of right distal fibula appreciated on x-ray. -Cam boot  applied -Encourage patient to take Tylenol for pain -Patient also given prescription for oxycodone 5 mg 2-3 times daily as needed for severe pain -Recommended rest, cool compresses 2-3 times daily for 15 minutes each, and elevating leg to reduce swelling. -Patient instructed to follow-up with her orthopedist ([facility]) within the next week   Final Clinical Impressions(s) / UC Diagnoses   Final diagnoses:  Other closed fracture of distal end of right fibula, initial encounter     Discharge Instructions     You have re-fractured your right Fibula.   1. I strongly recommend you follow up with Dr. Mardelle Matte with [facility] within the next week. Please call them ASAP to set up an appointment for follow up.   2. You can take Tylenol 500mg  3-4 times daily for pain. You can also continue to take your Tramadol for pain.  3. Please wear your boot while you are ambulating, you may take it off to elevate your left or to sleep at night.     ED Prescriptions    Medication Sig Dispense Auth. Provider   oxyCODONE (OXY IR/ROXICODONE) 5 MG immediate release tablet Take 1 tablet (5 mg total) by mouth every 6 (six) hours as needed for up to 3 days for severe pain. 12 tablet Daisy Floro, DO     PDMP not reviewed this encounter.   Milus Banister C, DO 09/14/20 1723

## 2020-09-14 NOTE — Discharge Instructions (Signed)
You have re-fractured your right Fibula.   I strongly recommend you follow up with Dr. Mardelle Matte with [facility] within the next week. Please call them ASAP to set up an appointment for follow up.   You can take Tylenol 500mg  3-4 times daily for pain. You can also continue to take your Tramadol for pain.  Please wear your boot while you are ambulating, you may take it off to elevate your left or to sleep at night.

## 2020-09-14 NOTE — ED Triage Notes (Signed)
Pt reports pain and swelling in the right ankle x 3 days after twisted the right ankle.

## 2020-09-22 DIAGNOSIS — S82831A Other fracture of upper and lower end of right fibula, initial encounter for closed fracture: Secondary | ICD-10-CM | POA: Diagnosis not present

## 2020-10-08 DIAGNOSIS — S82831D Other fracture of upper and lower end of right fibula, subsequent encounter for closed fracture with routine healing: Secondary | ICD-10-CM | POA: Diagnosis not present

## 2020-10-26 DIAGNOSIS — Z23 Encounter for immunization: Secondary | ICD-10-CM | POA: Diagnosis not present

## 2020-11-08 DIAGNOSIS — S82831D Other fracture of upper and lower end of right fibula, subsequent encounter for closed fracture with routine healing: Secondary | ICD-10-CM | POA: Diagnosis not present

## 2020-12-06 DIAGNOSIS — S82831D Other fracture of upper and lower end of right fibula, subsequent encounter for closed fracture with routine healing: Secondary | ICD-10-CM | POA: Diagnosis not present

## 2021-01-07 DIAGNOSIS — S82831D Other fracture of upper and lower end of right fibula, subsequent encounter for closed fracture with routine healing: Secondary | ICD-10-CM | POA: Diagnosis not present

## 2021-01-17 DIAGNOSIS — N183 Chronic kidney disease, stage 3 unspecified: Secondary | ICD-10-CM | POA: Diagnosis not present

## 2021-01-17 DIAGNOSIS — Z23 Encounter for immunization: Secondary | ICD-10-CM | POA: Diagnosis not present

## 2021-01-17 DIAGNOSIS — I129 Hypertensive chronic kidney disease with stage 1 through stage 4 chronic kidney disease, or unspecified chronic kidney disease: Secondary | ICD-10-CM | POA: Diagnosis not present

## 2021-01-17 DIAGNOSIS — E538 Deficiency of other specified B group vitamins: Secondary | ICD-10-CM | POA: Diagnosis not present

## 2021-01-17 DIAGNOSIS — K219 Gastro-esophageal reflux disease without esophagitis: Secondary | ICD-10-CM | POA: Diagnosis not present

## 2021-01-17 DIAGNOSIS — E785 Hyperlipidemia, unspecified: Secondary | ICD-10-CM | POA: Diagnosis not present

## 2021-01-17 DIAGNOSIS — E1169 Type 2 diabetes mellitus with other specified complication: Secondary | ICD-10-CM | POA: Diagnosis not present

## 2021-01-17 DIAGNOSIS — Z7984 Long term (current) use of oral hypoglycemic drugs: Secondary | ICD-10-CM | POA: Diagnosis not present

## 2021-02-18 DIAGNOSIS — S82831D Other fracture of upper and lower end of right fibula, subsequent encounter for closed fracture with routine healing: Secondary | ICD-10-CM | POA: Diagnosis not present

## 2021-03-04 DIAGNOSIS — Z23 Encounter for immunization: Secondary | ICD-10-CM | POA: Diagnosis not present

## 2021-03-10 DIAGNOSIS — Z7984 Long term (current) use of oral hypoglycemic drugs: Secondary | ICD-10-CM | POA: Diagnosis not present

## 2021-03-10 DIAGNOSIS — E2839 Other primary ovarian failure: Secondary | ICD-10-CM | POA: Diagnosis not present

## 2021-03-10 DIAGNOSIS — Z0000 Encounter for general adult medical examination without abnormal findings: Secondary | ICD-10-CM | POA: Diagnosis not present

## 2021-03-10 DIAGNOSIS — E1169 Type 2 diabetes mellitus with other specified complication: Secondary | ICD-10-CM | POA: Diagnosis not present

## 2021-03-16 ENCOUNTER — Other Ambulatory Visit: Payer: Self-pay | Admitting: Family Medicine

## 2021-03-16 DIAGNOSIS — E2839 Other primary ovarian failure: Secondary | ICD-10-CM

## 2021-05-20 DIAGNOSIS — N1831 Chronic kidney disease, stage 3a: Secondary | ICD-10-CM | POA: Diagnosis not present

## 2021-05-20 DIAGNOSIS — E785 Hyperlipidemia, unspecified: Secondary | ICD-10-CM | POA: Diagnosis not present

## 2021-05-20 DIAGNOSIS — E1169 Type 2 diabetes mellitus with other specified complication: Secondary | ICD-10-CM | POA: Diagnosis not present

## 2021-05-20 DIAGNOSIS — I129 Hypertensive chronic kidney disease with stage 1 through stage 4 chronic kidney disease, or unspecified chronic kidney disease: Secondary | ICD-10-CM | POA: Diagnosis not present

## 2021-05-20 DIAGNOSIS — K219 Gastro-esophageal reflux disease without esophagitis: Secondary | ICD-10-CM | POA: Diagnosis not present

## 2021-05-20 DIAGNOSIS — E1165 Type 2 diabetes mellitus with hyperglycemia: Secondary | ICD-10-CM | POA: Diagnosis not present

## 2021-05-24 ENCOUNTER — Encounter (HOSPITAL_COMMUNITY): Payer: HMO

## 2021-06-13 DIAGNOSIS — R059 Cough, unspecified: Secondary | ICD-10-CM | POA: Diagnosis not present

## 2021-06-13 DIAGNOSIS — U071 COVID-19: Secondary | ICD-10-CM | POA: Diagnosis not present

## 2021-06-20 ENCOUNTER — Other Ambulatory Visit: Payer: Self-pay | Admitting: Family Medicine

## 2021-06-20 DIAGNOSIS — Z1231 Encounter for screening mammogram for malignant neoplasm of breast: Secondary | ICD-10-CM

## 2021-08-08 ENCOUNTER — Ambulatory Visit
Admission: RE | Admit: 2021-08-08 | Discharge: 2021-08-08 | Disposition: A | Discharge status: Home / Self Care | Payer: PPO | Source: Ambulatory Visit | Attending: Family Medicine | Admitting: Family Medicine

## 2021-08-08 DIAGNOSIS — E2839 Other primary ovarian failure: Secondary | ICD-10-CM

## 2021-08-08 DIAGNOSIS — Z780 Asymptomatic menopausal state: Secondary | ICD-10-CM | POA: Diagnosis not present

## 2021-08-08 DIAGNOSIS — Z1231 Encounter for screening mammogram for malignant neoplasm of breast: Secondary | ICD-10-CM

## 2021-09-16 ENCOUNTER — Other Ambulatory Visit: Payer: Self-pay | Admitting: Family Medicine

## 2021-09-16 DIAGNOSIS — R0989 Other specified symptoms and signs involving the circulatory and respiratory systems: Secondary | ICD-10-CM

## 2021-09-16 DIAGNOSIS — E785 Hyperlipidemia, unspecified: Secondary | ICD-10-CM | POA: Diagnosis not present

## 2021-09-16 DIAGNOSIS — I700 Atherosclerosis of aorta: Secondary | ICD-10-CM | POA: Diagnosis not present

## 2021-09-16 DIAGNOSIS — Z23 Encounter for immunization: Secondary | ICD-10-CM | POA: Diagnosis not present

## 2021-09-16 DIAGNOSIS — E1169 Type 2 diabetes mellitus with other specified complication: Secondary | ICD-10-CM | POA: Diagnosis not present

## 2021-09-16 DIAGNOSIS — N183 Chronic kidney disease, stage 3 unspecified: Secondary | ICD-10-CM | POA: Diagnosis not present

## 2021-09-16 DIAGNOSIS — I129 Hypertensive chronic kidney disease with stage 1 through stage 4 chronic kidney disease, or unspecified chronic kidney disease: Secondary | ICD-10-CM | POA: Diagnosis not present

## 2021-09-20 DIAGNOSIS — N183 Chronic kidney disease, stage 3 unspecified: Secondary | ICD-10-CM | POA: Diagnosis not present

## 2021-09-20 DIAGNOSIS — I129 Hypertensive chronic kidney disease with stage 1 through stage 4 chronic kidney disease, or unspecified chronic kidney disease: Secondary | ICD-10-CM | POA: Diagnosis not present

## 2021-09-20 DIAGNOSIS — E1169 Type 2 diabetes mellitus with other specified complication: Secondary | ICD-10-CM | POA: Diagnosis not present

## 2021-09-20 DIAGNOSIS — E785 Hyperlipidemia, unspecified: Secondary | ICD-10-CM | POA: Diagnosis not present

## 2021-10-11 ENCOUNTER — Ambulatory Visit
Admission: RE | Admit: 2021-10-11 | Discharge: 2021-10-11 | Disposition: A | Discharge status: Home / Self Care | Payer: PPO | Source: Ambulatory Visit | Attending: Family Medicine | Admitting: Family Medicine

## 2021-10-11 DIAGNOSIS — I743 Embolism and thrombosis of arteries of the lower extremities: Secondary | ICD-10-CM | POA: Diagnosis not present

## 2021-10-11 DIAGNOSIS — R0989 Other specified symptoms and signs involving the circulatory and respiratory systems: Secondary | ICD-10-CM

## 2021-10-12 DIAGNOSIS — E1169 Type 2 diabetes mellitus with other specified complication: Secondary | ICD-10-CM | POA: Diagnosis not present

## 2021-10-12 DIAGNOSIS — L03116 Cellulitis of left lower limb: Secondary | ICD-10-CM | POA: Diagnosis not present

## 2021-10-12 DIAGNOSIS — I739 Peripheral vascular disease, unspecified: Secondary | ICD-10-CM | POA: Diagnosis not present

## 2021-10-12 DIAGNOSIS — L97221 Non-pressure chronic ulcer of left calf limited to breakdown of skin: Secondary | ICD-10-CM | POA: Diagnosis not present

## 2021-10-18 DIAGNOSIS — L97221 Non-pressure chronic ulcer of left calf limited to breakdown of skin: Secondary | ICD-10-CM | POA: Diagnosis not present

## 2021-10-18 DIAGNOSIS — L03116 Cellulitis of left lower limb: Secondary | ICD-10-CM | POA: Diagnosis not present

## 2021-10-19 NOTE — Progress Notes (Signed)
[name] Houston, [name] Houston ([id number]) Visit Report for 10/20/2021 Allergy List Details Patient Name: Date of Service: [name] Houston, [name] Houston 10/20/2021 1:15 PM Medical Record Number: [id number] Patient Account Number: [id number] Date of Birth/Sex: Treating RN: [birth year] (66 y.o. Sue Lush Primary Care Mannie Ohlin: Other Clinician: Referring Adelyne Marchese: Treating Rachelanne Whidby/Extender: Yaakov Guthrie in Treatment: 0 Allergies Active Allergies No Known Allergies Allergy Notes Electronic Signature(s) Signed: 10/19/2021 4:26:14 PM By: Lorrin Jackson Entered By: Lorrin Jackson on 10/19/2021 16:26:14

## 2021-10-20 ENCOUNTER — Encounter (HOSPITAL_BASED_OUTPATIENT_CLINIC_OR_DEPARTMENT_OTHER): Payer: HMO | Attending: Internal Medicine | Admitting: Internal Medicine

## 2021-10-20 DIAGNOSIS — I70248 Atherosclerosis of native arteries of left leg with ulceration of other part of lower left leg: Secondary | ICD-10-CM | POA: Insufficient documentation

## 2021-10-20 DIAGNOSIS — Z87891 Personal history of nicotine dependence: Secondary | ICD-10-CM | POA: Insufficient documentation

## 2021-10-20 DIAGNOSIS — I87312 Chronic venous hypertension (idiopathic) with ulcer of left lower extremity: Secondary | ICD-10-CM | POA: Insufficient documentation

## 2021-10-20 DIAGNOSIS — E11622 Type 2 diabetes mellitus with other skin ulcer: Secondary | ICD-10-CM | POA: Insufficient documentation

## 2021-10-26 DIAGNOSIS — I129 Hypertensive chronic kidney disease with stage 1 through stage 4 chronic kidney disease, or unspecified chronic kidney disease: Secondary | ICD-10-CM | POA: Diagnosis not present

## 2021-10-26 DIAGNOSIS — N183 Chronic kidney disease, stage 3 unspecified: Secondary | ICD-10-CM | POA: Diagnosis not present

## 2021-10-26 DIAGNOSIS — E1169 Type 2 diabetes mellitus with other specified complication: Secondary | ICD-10-CM | POA: Diagnosis not present

## 2021-10-26 DIAGNOSIS — K219 Gastro-esophageal reflux disease without esophagitis: Secondary | ICD-10-CM | POA: Diagnosis not present

## 2021-10-26 DIAGNOSIS — E785 Hyperlipidemia, unspecified: Secondary | ICD-10-CM | POA: Diagnosis not present

## 2021-10-28 DIAGNOSIS — N1832 Chronic kidney disease, stage 3b: Secondary | ICD-10-CM | POA: Diagnosis not present

## 2021-10-28 DIAGNOSIS — I129 Hypertensive chronic kidney disease with stage 1 through stage 4 chronic kidney disease, or unspecified chronic kidney disease: Secondary | ICD-10-CM | POA: Diagnosis not present

## 2021-10-28 DIAGNOSIS — E1122 Type 2 diabetes mellitus with diabetic chronic kidney disease: Secondary | ICD-10-CM | POA: Diagnosis not present

## 2021-11-02 ENCOUNTER — Encounter: Payer: Self-pay | Admitting: Vascular Surgery

## 2021-11-02 ENCOUNTER — Ambulatory Visit (INDEPENDENT_AMBULATORY_CARE_PROVIDER_SITE_OTHER): Payer: PPO | Admitting: Vascular Surgery

## 2021-11-02 VITALS — BP 134/82 | HR 58 | Temp 98.3°F | Resp 20 | Ht 64.0 in | Wt 203.0 lb

## 2021-11-02 DIAGNOSIS — I70262 Atherosclerosis of native arteries of extremities with gangrene, left leg: Secondary | ICD-10-CM

## 2021-11-02 NOTE — Progress Notes (Signed)
Patient ID: [name] Houston, female   DOB: 09-21-1955, 66 y.o.   MRN: [id number]  Reason for Consult: New Patient (Initial Visit)   Referred by Harlan Stains, MD  Subjective:     HPI:  [name] Houston is a 66 y.o. female without significant vascular history.  She does have diabetes, hypertension hyperlipidemia.  She is a former smoker quit 3 weeks ago.  She previously took aspirin stopped taking this about 1 month ago at the direction of her physician.  She does take Pravachol daily without any complications from this.  In the last month she has a wound on the left posterior calf for which she is seeing the wound care center placing [facility].  She states the wound has been very slow to improve she denies any fevers or chills.  No previous vascular intervention.  Denies personal family history of aneurysm disease.  She denies any claudication.  She has associated leg swelling in the left lower extremity.  Past Medical History:  Diagnosis Date   Diabetes mellitus without complication ([place])    Hiatal hernia 2015   DG Esophagus    Hyperlipidemia    Hypertension    Kidney disease 2013   Family History  Problem Relation Age of Onset   Hypertension Mother    Diabetes Mother    CVA Father    Hypertension Father    Diabetes Father    Pancreatic cancer Father    Pulmonary embolism Father    Bone cancer Maternal Aunt    Diabetes Paternal Grandmother    Past Surgical History:  Procedure Laterality Date   BREAST ABSESS DRAINED  7/08   CHOLECYSTECTOMY     ORIF TIBIA PLATEAU  3/09    Short Social History:  Social History   Tobacco Use   Smoking status: Heavy Smoker    Packs/day: 0.50    Types: Cigarettes   Smokeless tobacco: Never  Substance Use Topics   Alcohol use: Yes    Comment: occ    No Known Allergies  Current Outpatient Medications  Medication Sig Dispense Refill   albuterol (PROVENTIL HFA;VENTOLIN HFA) 108 (90 Base) MCG/ACT inhaler Inhale 1-2 puffs  into the lungs every 6 (six) hours as needed for wheezing or shortness of breath. 1 Inhaler 0   amLODipine (NORVASC) 10 MG tablet Take 10 mg by mouth daily.     aspirin 81 MG tablet Take 81 mg by mouth daily.     benzonatate (TESSALON PERLES) 100 MG capsule Take 1 capsule (100 mg total) by mouth 3 (three) times daily as needed for cough. FOR DISRUPTIVE DAY TIME COUGH 20 capsule 0   calcium carbonate (OS-CAL) 600 MG TABS tablet Take 600 mg by mouth 2 (two) times daily with a meal.     cyclobenzaprine (FLEXERIL) 10 MG tablet Take 0.5-1 tablets (5-10 mg total) by mouth at bedtime as needed for muscle spasms. 10 tablet 0   glipiZIDE (GLUCOTROL) 5 MG tablet Take 5 mg by mouth 2 (two) times daily.     lisinopril-hydrochlorothiazide (PRINZIDE,ZESTORETIC) 20-25 MG per tablet Take 1 tablet by mouth daily.     metFORMIN (GLUMETZA) 500 MG (MOD) 24 hr tablet Take 500 mg by mouth.     omeprazole (PRILOSEC) 20 MG capsule Take 1 capsule (20 mg total) by mouth daily. 14 capsule 1   pravastatin (PRAVACHOL) 40 MG tablet Take 40 mg by mouth daily.     No current facility-administered medications for this visit.    Review of Systems  Constitutional:  Constitutional negative. HENT: HENT negative.  Eyes: Eyes negative.  Cardiovascular: Positive for leg swelling.  GI: Gastrointestinal negative.  Skin: Positive for wound.  Neurological: Neurological negative. Hematologic: Hematologic/lymphatic negative.  Psychiatric: Psychiatric negative.        Objective:  Objective   Vitals:   11/02/21 1425  BP: 134/82  Pulse: (!) 58  Resp: 20  Temp: 98.3 F (36.8 C)  SpO2: 97%  Weight: 203 lb (92.1 kg)  Height: '5\' 4"'$  (1.626 m)   Body mass index is 34.84 kg/m.  Physical Exam HENT:     Head: Normocephalic.     Nose: Nose normal.  Eyes:     Pupils: Pupils are equal, round, and reactive to light.  Neck:     Vascular: No carotid bruit.  Cardiovascular:     Pulses:          Femoral pulses are 2+ on the  right side and 0 on the left side.      Popliteal pulses are 0 on the right side and 0 on the left side.       Dorsalis pedis pulses are 0 on the right side and 0 on the left side.  Abdominal:     General: Abdomen is flat.     Palpations: There is no mass.  Musculoskeletal:     Cervical back: Normal range of motion and neck supple.  Skin:    Capillary Refill: Capillary refill takes more than 3 seconds.     Comments: There is a 2-1/2 cm wound on the left posterior calf with central eschar  Neurological:     General: No focal deficit present.     Mental Status: She is alert.     Data: Right:   Resting ankle brachial index:  0.69   Segmental blood pressure: Upper extremity pressures are symmetric. Appropriate increased to the thigh. Significant drop to the calf segment and further at the ankle.   Doppler: Segmental Doppler demonstrates monophasic waveforms throughout   Pulse volume recording: Deterioration of quality and amplitude throughout. Flat line at the right great toe   Left:   Resting ankle brachial index: 0.52   Segmental blood pressure: Upper extremity pressures are symmetric. Decreased to the thigh segment. Further decrease below the knee   Doppler: Segmental Doppler demonstrates monophasic waveforms of the femoropopliteal segment.   Pulse volume recording: Deterioration of quality and amplitude throughout   Additional:   IMPRESSION: Resting ABI of the bilateral lower extremities in the moderate range arterial occlusive disease, and would be expected to decrease after exercise exam.   Segmental exam demonstrates iliac occlusive disease, as well as suspected bilateral femoropopliteal disease.       Assessment/Plan:    66 year old female with left lower extremity wound with concern for multiple level disease without readily palpable left common femoral pulse although the right is readily palpable.  For this reason we will begin with angiography from the  right common femoral approach to evaluate the left.  I discussed possible intervention possible need for bypass surgery.  She will continue wound care treatment at the wound care center and we will get her scheduled very near future.     Waynetta Sandy MD [facility]

## 2021-11-02 NOTE — H&P (View-Only) (Signed)
Patient ID: [name] Houston, female   DOB: [birth year], 66 y.o.   MRN: [id number]  Reason for Consult: New Patient (Initial Visit)   Referred by Harlan Stains, MD  Subjective:     HPI:  [name] Houston is a 66 y.o. female without significant vascular history.  She does have diabetes, hypertension hyperlipidemia.  She is a former smoker quit 3 weeks ago.  She previously took aspirin stopped taking this about 1 month ago at the direction of her physician.  She does take Pravachol daily without any complications from this.  In the last month she has a wound on the left posterior calf for which she is seeing the wound care center placing [facility].  She states the wound has been very slow to improve she denies any fevers or chills.  No previous vascular intervention.  Denies personal family history of aneurysm disease.  She denies any claudication.  She has associated leg swelling in the left lower extremity.  Past Medical History:  Diagnosis Date   Diabetes mellitus without complication ([place])    Hiatal hernia 2015   DG Esophagus    Hyperlipidemia    Hypertension    Kidney disease 2013   Family History  Problem Relation Age of Onset   Hypertension Mother    Diabetes Mother    CVA Father    Hypertension Father    Diabetes Father    Pancreatic cancer Father    Pulmonary embolism Father    Bone cancer Maternal Aunt    Diabetes Paternal Grandmother    Past Surgical History:  Procedure Laterality Date   BREAST ABSESS DRAINED  7/08   CHOLECYSTECTOMY     ORIF TIBIA PLATEAU  3/09    Short Social History:  Social History   Tobacco Use   Smoking status: Heavy Smoker    Packs/day: 0.50    Types: Cigarettes   Smokeless tobacco: Never  Substance Use Topics   Alcohol use: Yes    Comment: occ    No Known Allergies  Current Outpatient Medications  Medication Sig Dispense Refill   albuterol (PROVENTIL HFA;VENTOLIN HFA) 108 (90 Base) MCG/ACT inhaler Inhale 1-2 puffs  into the lungs every 6 (six) hours as needed for wheezing or shortness of breath. 1 Inhaler 0   amLODipine (NORVASC) 10 MG tablet Take 10 mg by mouth daily.     aspirin 81 MG tablet Take 81 mg by mouth daily.     benzonatate (TESSALON PERLES) 100 MG capsule Take 1 capsule (100 mg total) by mouth 3 (three) times daily as needed for cough. FOR DISRUPTIVE DAY TIME COUGH 20 capsule 0   calcium carbonate (OS-CAL) 600 MG TABS tablet Take 600 mg by mouth 2 (two) times daily with a meal.     cyclobenzaprine (FLEXERIL) 10 MG tablet Take 0.5-1 tablets (5-10 mg total) by mouth at bedtime as needed for muscle spasms. 10 tablet 0   glipiZIDE (GLUCOTROL) 5 MG tablet Take 5 mg by mouth 2 (two) times daily.     lisinopril-hydrochlorothiazide (PRINZIDE,ZESTORETIC) 20-25 MG per tablet Take 1 tablet by mouth daily.     metFORMIN (GLUMETZA) 500 MG (MOD) 24 hr tablet Take 500 mg by mouth.     omeprazole (PRILOSEC) 20 MG capsule Take 1 capsule (20 mg total) by mouth daily. 14 capsule 1   pravastatin (PRAVACHOL) 40 MG tablet Take 40 mg by mouth daily.     No current facility-administered medications for this visit.    Review of Systems  Constitutional:  Constitutional negative. HENT: HENT negative.  Eyes: Eyes negative.  Cardiovascular: Positive for leg swelling.  GI: Gastrointestinal negative.  Skin: Positive for wound.  Neurological: Neurological negative. Hematologic: Hematologic/lymphatic negative.  Psychiatric: Psychiatric negative.        Objective:  Objective   Vitals:   11/02/21 1425  BP: 134/82  Pulse: (!) 58  Resp: 20  Temp: 98.3 F (36.8 C)  SpO2: 97%  Weight: 203 lb (92.1 kg)  Height: '5\' 4"'$  (1.626 m)   Body mass index is 34.84 kg/m.  Physical Exam HENT:     Head: Normocephalic.     Nose: Nose normal.  Eyes:     Pupils: Pupils are equal, round, and reactive to light.  Neck:     Vascular: No carotid bruit.  Cardiovascular:     Pulses:          Femoral pulses are 2+ on the  right side and 0 on the left side.      Popliteal pulses are 0 on the right side and 0 on the left side.       Dorsalis pedis pulses are 0 on the right side and 0 on the left side.  Abdominal:     General: Abdomen is flat.     Palpations: There is no mass.  Musculoskeletal:     Cervical back: Normal range of motion and neck supple.  Skin:    Capillary Refill: Capillary refill takes more than 3 seconds.     Comments: There is a 2-1/2 cm wound on the left posterior calf with central eschar  Neurological:     General: No focal deficit present.     Mental Status: She is alert.     Data: Right:   Resting ankle brachial index:  0.69   Segmental blood pressure: Upper extremity pressures are symmetric. Appropriate increased to the thigh. Significant drop to the calf segment and further at the ankle.   Doppler: Segmental Doppler demonstrates monophasic waveforms throughout   Pulse volume recording: Deterioration of quality and amplitude throughout. Flat line at the right great toe   Left:   Resting ankle brachial index: 0.52   Segmental blood pressure: Upper extremity pressures are symmetric. Decreased to the thigh segment. Further decrease below the knee   Doppler: Segmental Doppler demonstrates monophasic waveforms of the femoropopliteal segment.   Pulse volume recording: Deterioration of quality and amplitude throughout   Additional:   IMPRESSION: Resting ABI of the bilateral lower extremities in the moderate range arterial occlusive disease, and would be expected to decrease after exercise exam.   Segmental exam demonstrates iliac occlusive disease, as well as suspected bilateral femoropopliteal disease.       Assessment/Plan:    66 year old female with left lower extremity wound with concern for multiple level disease without readily palpable left common femoral pulse although the right is readily palpable.  For this reason we will begin with angiography from the  right common femoral approach to evaluate the left.  I discussed possible intervention possible need for bypass surgery.  She will continue wound care treatment at the wound care center and we will get her scheduled very near future.     Waynetta Sandy MD [facility]

## 2021-11-03 DIAGNOSIS — I739 Peripheral vascular disease, unspecified: Secondary | ICD-10-CM | POA: Diagnosis not present

## 2021-11-03 DIAGNOSIS — S81802A Unspecified open wound, left lower leg, initial encounter: Secondary | ICD-10-CM | POA: Diagnosis not present

## 2021-11-03 DIAGNOSIS — N1831 Chronic kidney disease, stage 3a: Secondary | ICD-10-CM | POA: Diagnosis not present

## 2021-11-03 DIAGNOSIS — I129 Hypertensive chronic kidney disease with stage 1 through stage 4 chronic kidney disease, or unspecified chronic kidney disease: Secondary | ICD-10-CM | POA: Diagnosis not present

## 2021-11-03 DIAGNOSIS — I700 Atherosclerosis of aorta: Secondary | ICD-10-CM | POA: Diagnosis not present

## 2021-11-03 DIAGNOSIS — E785 Hyperlipidemia, unspecified: Secondary | ICD-10-CM | POA: Diagnosis not present

## 2021-11-03 DIAGNOSIS — E1169 Type 2 diabetes mellitus with other specified complication: Secondary | ICD-10-CM | POA: Diagnosis not present

## 2021-11-04 ENCOUNTER — Encounter (HOSPITAL_BASED_OUTPATIENT_CLINIC_OR_DEPARTMENT_OTHER): Payer: HMO | Attending: Internal Medicine | Admitting: Internal Medicine

## 2021-11-04 ENCOUNTER — Other Ambulatory Visit: Payer: Self-pay

## 2021-11-04 DIAGNOSIS — E1122 Type 2 diabetes mellitus with diabetic chronic kidney disease: Secondary | ICD-10-CM | POA: Insufficient documentation

## 2021-11-04 DIAGNOSIS — I87312 Chronic venous hypertension (idiopathic) with ulcer of left lower extremity: Secondary | ICD-10-CM | POA: Diagnosis present

## 2021-11-04 DIAGNOSIS — I129 Hypertensive chronic kidney disease with stage 1 through stage 4 chronic kidney disease, or unspecified chronic kidney disease: Secondary | ICD-10-CM | POA: Diagnosis not present

## 2021-11-04 DIAGNOSIS — E1165 Type 2 diabetes mellitus with hyperglycemia: Secondary | ICD-10-CM | POA: Insufficient documentation

## 2021-11-04 DIAGNOSIS — N183 Chronic kidney disease, stage 3 unspecified: Secondary | ICD-10-CM | POA: Diagnosis not present

## 2021-11-04 DIAGNOSIS — E11622 Type 2 diabetes mellitus with other skin ulcer: Secondary | ICD-10-CM

## 2021-11-04 DIAGNOSIS — I70262 Atherosclerosis of native arteries of extremities with gangrene, left leg: Secondary | ICD-10-CM

## 2021-11-04 DIAGNOSIS — E1151 Type 2 diabetes mellitus with diabetic peripheral angiopathy without gangrene: Secondary | ICD-10-CM | POA: Insufficient documentation

## 2021-11-04 DIAGNOSIS — I70248 Atherosclerosis of native arteries of left leg with ulceration of other part of lower left leg: Secondary | ICD-10-CM | POA: Diagnosis not present

## 2021-11-04 DIAGNOSIS — L97828 Non-pressure chronic ulcer of other part of left lower leg with other specified severity: Secondary | ICD-10-CM

## 2021-11-04 NOTE — Progress Notes (Signed)
[name] Houston, [name] Houston ([id number]) Visit Report for 11/04/2021 Arrival Information Details Patient Name: Date of Service: [name] Houston 11/04/2021 12:30 PM Medical Record Number: [id number] Patient Account Number: [id number] Date of Birth/Sex: Treating RN: Jun 17, 1955 (65 y.o. [name] Houston Primary Care [name] Houston: Harlan Stains Other Clinician: Referring Abdias Hickam: Treating Coretha Creswell/Extender: Jake Church in Treatment: 2 Visit Information History Since Last Visit All ordered tests and consults were completed: Yes Patient Arrived: [name] Houston Added or deleted any medications: No Arrival Time: 12:44 Any new allergies or adverse reactions: No Accompanied By: Husband Had a fall or experienced change in No Transfer Assistance: None activities of daily living that may affect Patient Requires Transmission-Based Precautions: No risk of falls: Patient Has Alerts: Yes Signs or symptoms of abuse/neglect since last visito No Patient Alerts: Patient on Blood Thinner Hospitalized since last visit: No 10/11/21 ABI R=.69 L=.52 Implantable device outside of the clinic excluding No cellular tissue based products placed in the center since last visit: Has Dressing in Place as Prescribed: Yes Pain Present Now: No Electronic Signature(s) Signed: 11/04/2021 4:12:12 PM By: Lorrin Jackson Entered By: Lorrin Jackson on 11/04/2021 12:46:45 -------------------------------------------------------------------------------- Clinic Level of Care Assessment Details Patient Name: Date of Service: [name] Houston 11/04/2021 12:30 PM Medical Record Number: [id number] Patient Account Number: [id number] Date of Birth/Sex: Treating RN: 22-Aug-1955 (66 y.o. [name] Houston Primary Care Deona Novitski: Harlan Stains Other Clinician: Referring Jerrine Urschel: Treating Jazlin Tapscott/Extender: Jake Church in Treatment: 2 Clinic Level of Care Assessment Items TOOL 4 Quantity  Score X- 1 0 Use when only an EandM is performed on FOLLOW-UP visit ASSESSMENTS - Nursing Assessment / Reassessment X- 1 10 Reassessment of Co-morbidities (includes updates in patient status) X- 1 5 Reassessment of Adherence to Treatment Plan ASSESSMENTS - Wound and Skin A ssessment / Reassessment X - Simple Wound Assessment / Reassessment - one wound 1 5 '[]'$  - 0 Complex Wound Assessment / Reassessment - multiple wounds '[]'$  - 0 Dermatologic / Skin Assessment (not related to wound area) ASSESSMENTS - Focused Assessment '[]'$  - 0 Circumferential Edema Measurements - multi extremities '[]'$  - 0 Nutritional Assessment / Counseling / Intervention '[]'$  - 0 Lower Extremity Assessment (monofilament, tuning fork, pulses) '[]'$  - 0 Peripheral Arterial Disease Assessment (using hand held doppler) ASSESSMENTS - Ostomy and/or Continence Assessment and Care '[]'$  - 0 Incontinence Assessment and Management '[]'$  - 0 Ostomy Care Assessment and Management (repouching, etc.) PROCESS - Coordination of Care '[]'$  - 0 Simple Patient / Family Education for ongoing care X- 1 20 Complex (extensive) Patient / Family Education for ongoing care X- 1 10 Staff obtains Programmer, systems, Records, T Results / Process Orders est '[]'$  - 0 Staff telephones HHA, Nursing Homes / Clarify orders / etc '[]'$  - 0 Routine Transfer to another Facility (non-emergent condition) '[]'$  - 0 Routine Hospital Admission (non-emergent condition) '[]'$  - 0 New Admissions / Biomedical engineer / Ordering NPWT Apligraf, etc. , '[]'$  - 0 Emergency Hospital Admission (emergent condition) '[]'$  - 0 Simple Discharge Coordination '[]'$  - 0 Complex (extensive) Discharge Coordination PROCESS - Special Needs '[]'$  - 0 Pediatric / Minor Patient Management '[]'$  - 0 Isolation Patient Management '[]'$  - 0 Hearing / Language / Visual special needs '[]'$  - 0 Assessment of Community assistance (transportation, D/C planning, etc.) '[]'$  - 0 Additional assistance / Altered  mentation '[]'$  - 0 Support Surface(s) Assessment (bed, cushion, seat, etc.) INTERVENTIONS - Wound Cleansing / Measurement X - Simple Wound Cleansing - one wound 1 5 '[]'$  -  0 Complex Wound Cleansing - multiple wounds X- 1 5 Wound Imaging (photographs - any number of wounds) '[]'$  - 0 Wound Tracing (instead of photographs) X- 1 5 Simple Wound Measurement - one wound '[]'$  - 0 Complex Wound Measurement - multiple wounds INTERVENTIONS - Wound Dressings '[]'$  - 0 Small Wound Dressing one or multiple wounds X- 1 15 Medium Wound Dressing one or multiple wounds '[]'$  - 0 Large Wound Dressing one or multiple wounds '[]'$  - 0 Application of Medications - topical '[]'$  - 0 Application of Medications - injection INTERVENTIONS - Miscellaneous '[]'$  - 0 External ear exam '[]'$  - 0 Specimen Collection (cultures, biopsies, blood, body fluids, etc.) '[]'$  - 0 Specimen(s) / Culture(s) sent or taken to Lab for analysis '[]'$  - 0 Patient Transfer (multiple staff / Civil Service fast streamer / Similar devices) '[]'$  - 0 Simple Staple / Suture removal (25 or less) '[]'$  - 0 Complex Staple / Suture removal (26 or more) '[]'$  - 0 Hypo / Hyperglycemic Management (close monitor of Blood Glucose) '[]'$  - 0 Ankle / Brachial Index (ABI) - do not check if billed separately X- 1 5 Vital Signs Has the patient been seen at the hospital within the last three years: Yes Total Score: 85 Level Of Care: New/Established - Level 3 Electronic Signature(s) Signed: 11/04/2021 4:12:12 PM By: Lorrin Jackson Entered By: Lorrin Jackson on 11/04/2021 13:10:20 -------------------------------------------------------------------------------- Encounter Discharge Information Details Patient Name: Date of Service: [name] Houston. 11/04/2021 12:30 PM Medical Record Number: [id number] Patient Account Number: [id number] Date of Birth/Sex: Treating RN: 1955/05/04 (66 y.o. [name] Houston Primary Care Delanie Tirrell: Harlan Stains Other Clinician: Referring Chelsey Kimberley: Treating  Arius Harnois/Extender: Jake Church in Treatment: 2 Encounter Discharge Information Items Discharge Condition: Stable Ambulatory Status: Cane Discharge Destination: Home Transportation: Private Auto Accompanied By: husband Schedule Follow-up Appointment: Yes Clinical Summary of Care: Provided on 11/04/2021 Form Type Recipient Paper Patient Patient Electronic Signature(s) Signed: 11/04/2021 4:12:12 PM By: Lorrin Jackson Entered By: Lorrin Jackson on 11/04/2021 13:14:14 -------------------------------------------------------------------------------- Lower Extremity Assessment Details Patient Name: Date of Service: [name] Houston. 11/04/2021 12:30 PM Medical Record Number: [id number] Patient Account Number: [id number] Date of Birth/Sex: Treating RN: 01/20/1956 (66 y.o. [name] Houston Primary Care Keondrick Dilks: Harlan Stains Other Clinician: Referring Kharee Lesesne: Treating Marnita Poirier/Extender: [name] Weeks in Treatment: 2 Edema Assessment Assessed: [Left: Yes] [Right: No] Edema: [Left: N] [Right: o] Calf Left: Right: Point of Measurement: 36 cm From Medial Instep 36 cm Ankle Left: Right: Point of Measurement: 10 cm From Medial Instep 22 cm Knee To Floor Left: Right: From Medial Instep 41 cm Vascular Assessment Pulses: Dorsalis Pedis Palpable: [Left:Yes] Electronic Signature(s) Signed: 11/04/2021 4:12:12 PM By: Lorrin Jackson Entered By: Lorrin Jackson on 11/04/2021 12:53:18 -------------------------------------------------------------------------------- Multi Wound Chart Details Patient Name: Date of Service: [name] Houston. 11/04/2021 12:30 PM Medical Record Number: [id number] Patient Account Number: [id number] Date of Birth/Sex: Treating RN: 31-Jul-1955 (66 y.o. [name] Houston Primary Care Nikeisha Klutz: Harlan Stains Other Clinician: Referring Orva Riles: Treating Creighton Longley/Extender: [name] Weeks in  Treatment: 2 Vital Signs Height(in): 74 Pulse(bpm): 70 Weight(lbs): 204 Blood Pressure(mmHg): 160/85 Body Mass Index(BMI): 35 Temperature(F): 98.0 Respiratory Rate(breaths/min): 18 Photos: [N/A:N/A] Left, Posterior Lower Leg N/A N/A Wound Location: Gradually Appeared N/A N/A Wounding Event: Arterial Insufficiency Ulcer N/A N/A Primary Etiology: Hypertension, Peripheral Arterial N/A N/A Comorbid History: Disease, Peripheral Venous Disease, Type II Diabetes, Osteoarthritis, Osteomyelitis 09/29/2021 N/A N/A Date Acquired: 2 N/A N/A Weeks of Treatment: Open N/A N/A  Wound Status: No N/A N/A Wound Recurrence: 3.7x2.7x0.1 N/A N/A Measurements L x W x D (cm) 7.846 N/A N/A A (cm) : rea 0.785 N/A N/A Volume (cm) : 10.00% N/A N/A % Reduction in Area: 10.00% N/A N/A % Reduction in Volume: Full Thickness Without Exposed N/A N/A Classification: Support Structures Medium N/A N/A Exudate A mount: Serosanguineous N/A N/A Exudate Type: red, brown N/A N/A Exudate Color: Distinct, outline attached N/A N/A Wound Margin: None Present (0%) N/A N/A Granulation Amount: Large (67-100%) N/A N/A Necrotic Amount: Eschar, Adherent Slough N/A N/A Necrotic Tissue: Fat Layer (Subcutaneous Tissue): Yes N/A N/A Exposed Structures: Fascia: No Tendon: No Muscle: No Joint: No Bone: No None N/A N/A Epithelialization: Treatment Notes Wound #1 (Lower Leg) Wound Laterality: Left, Posterior Cleanser Soap and Water Discharge Instruction: May shower and wash wound with dial antibacterial soap and water prior to dressing change. Wound Cleanser Discharge Instruction: Cleanse the wound with wound cleanser prior to applying a clean dressing using gauze sponges, not tissue or cotton balls. Peri-Wound Care Topical Primary Dressing MediHoney Gel, tube 1.5 (oz) Discharge Instruction: Apply to wound bed as instructed Secondary Dressing T Non-Adherent Dressing, 3x4 in elfa Discharge  Instruction: Apply over primary dressing as directed. Secured With 55M Medipore H Soft Cloth Surgical T ape, 4 x 10 (in/yd) Discharge Instruction: Secure with tape as directed. Compression Wrap Compression Stockings Add-Ons Electronic Signature(s) Signed: 11/04/2021 1:21:19 PM By: Kalman Shan DO Signed: 11/04/2021 4:12:12 PM By: Lorrin Jackson Entered By: Kalman Shan on 11/04/2021 13:16:56 -------------------------------------------------------------------------------- Multi-Disciplinary Care Plan Details Patient Name: Date of Service: [name] Houston. 11/04/2021 12:30 PM Medical Record Number: [id number] Patient Account Number: [id number] Date of Birth/Sex: Treating RN: Nov 28, 1955 (66 y.o. [name] Houston Primary Care Severa Jeremiah: Harlan Stains Other Clinician: Referring Winton Offord: Treating Tressy Kunzman/Extender: Jake Church in Treatment: 2 Active Inactive Nutrition Nursing Diagnoses: Impaired glucose control: actual or potential Goals: Patient/caregiver verbalizes understanding of need to maintain therapeutic glucose control per primary care physician Date Initiated: 10/20/2021 Target Resolution Date: 11/17/2021 Goal Status: Active Interventions: Assess HgA1c results as ordered upon admission and as needed Provide education on elevated blood sugars and impact on wound healing Provide education on nutrition Notes: Wound/Skin Impairment Nursing Diagnoses: Impaired tissue integrity Goals: Patient/caregiver will verbalize understanding of skin care regimen Date Initiated: 10/20/2021 Target Resolution Date: 11/17/2021 Goal Status: Active Ulcer/skin breakdown will have a volume reduction of 30% by week 4 Date Initiated: 10/20/2021 Target Resolution Date: 11/17/2021 Goal Status: Active Interventions: Assess patient/caregiver ability to obtain necessary supplies Assess patient/caregiver ability to perform ulcer/skin care regimen upon admission and  as needed Assess ulceration(s) every visit Provide education on ulcer and skin care Treatment Activities: Topical wound management initiated : 10/20/2021 Notes: Electronic Signature(s) Signed: 11/04/2021 4:12:12 PM By: Lorrin Jackson Entered By: Lorrin Jackson on 11/04/2021 12:43:58 -------------------------------------------------------------------------------- Pain Assessment Details Patient Name: Date of Service: [name] Houston. 11/04/2021 12:30 PM Medical Record Number: [id number] Patient Account Number: [id number] Date of Birth/Sex: Treating RN: 05-04-1955 (66 y.o. [name] Houston Primary Care Cornesha Radziewicz: Harlan Stains Other Clinician: Referring Levon Penning: Treating Kees Idrovo/Extender: [name] Weeks in Treatment: 2 Active Problems Location of Pain Severity and Description of Pain Patient Has Paino No Site Locations Pain Management and Medication Current Pain Management: Electronic Signature(s) Signed: 11/04/2021 4:12:12 PM By: Lorrin Jackson Entered By: Lorrin Jackson on 11/04/2021 12:49:21 -------------------------------------------------------------------------------- Patient/Caregiver Education Details Patient Name: Date of Service: [name] 7/7/2023andnbsp12:30 PM Medical Record Number: [id number] Patient  Account Number: [id number] Date of Birth/Gender: Treating RN: 1955/07/20 (66 y.o. [name] Houston Primary Care Physician: Harlan Stains Other Clinician: Referring Physician: Treating Physician/Extender: Jake Church in Treatment: 2 Education Assessment Education Provided To: Patient Education Topics Provided Elevated Blood Sugar/ Impact on Healing: Methods: Explain/Verbal, Printed Responses: State content correctly Wound/Skin Impairment: Methods: Explain/Verbal, Printed Responses: State content correctly Electronic Signature(s) Signed: 11/04/2021 4:12:12 PM By: Lorrin Jackson Entered By:  Lorrin Jackson on 11/04/2021 12:44:17 -------------------------------------------------------------------------------- Wound Assessment Details Patient Name: Date of Service: [name] Houston. 11/04/2021 12:30 PM Medical Record Number: [id number] Patient Account Number: [id number] Date of Birth/Sex: Treating RN: Feb 20, 1956 (66 y.o. [name] Houston Primary Care Davione Lenker: Harlan Stains Other Clinician: Referring Labresha Mellor: Treating Chiara Coltrin/Extender: [name] Weeks in Treatment: 2 Wound Status Wound Number: 1 Primary Arterial Insufficiency Ulcer Etiology: Wound Location: Left, Posterior Lower Leg Wound Open Wounding Event: Gradually Appeared Status: Date Acquired: 09/29/2021 Comorbid Hypertension, Peripheral Arterial Disease, Peripheral Venous Weeks Of Treatment: 2 History: Disease, Type II Diabetes, Osteoarthritis, Osteomyelitis Clustered Wound: No Photos Wound Measurements Length: (cm) 3.7 Width: (cm) 2.7 Depth: (cm) 0.1 Area: (cm) 7.846 Volume: (cm) 0.785 % Reduction in Area: 10% % Reduction in Volume: 10% Epithelialization: None Tunneling: No Undermining: No Wound Description Classification: Full Thickness Without Exposed Support Structures Wound Margin: Distinct, outline attached Exudate Amount: Medium Exudate Type: Serosanguineous Exudate Color: red, brown Foul Odor After Cleansing: No Slough/Fibrino Yes Wound Bed Granulation Amount: None Present (0%) Exposed Structure Necrotic Amount: Large (67-100%) Fascia Exposed: No Necrotic Quality: Eschar, Adherent Slough Fat Layer (Subcutaneous Tissue) Exposed: Yes Tendon Exposed: No Muscle Exposed: No Joint Exposed: No Bone Exposed: No Treatment Notes Wound #1 (Lower Leg) Wound Laterality: Left, Posterior Cleanser Soap and Water Discharge Instruction: May shower and wash wound with dial antibacterial soap and water prior to dressing change. Wound Cleanser Discharge Instruction:  Cleanse the wound with wound cleanser prior to applying a clean dressing using gauze sponges, not tissue or cotton balls. Peri-Wound Care Topical Primary Dressing MediHoney Gel, tube 1.5 (oz) Discharge Instruction: Apply to wound bed as instructed Secondary Dressing T Non-Adherent Dressing, 3x4 in elfa Discharge Instruction: Apply over primary dressing as directed. Secured With 24M Medipore H Soft Cloth Surgical T ape, 4 x 10 (in/yd) Discharge Instruction: Secure with tape as directed. Compression Wrap Compression Stockings Add-Ons Electronic Signature(s) Signed: 11/04/2021 4:12:12 PM By: Lorrin Jackson Entered By: Lorrin Jackson on 11/04/2021 12:52:34 -------------------------------------------------------------------------------- Vitals Details Patient Name: Date of Service: [name] Houston. 11/04/2021 12:30 PM Medical Record Number: [id number] Patient Account Number: [id number] Date of Birth/Sex: Treating RN: March 09, 1956 (66 y.o. [name] Houston Primary Care Jazelle Achey: Harlan Stains Other Clinician: Referring Collins Kerby: Treating Kendre Jacinto/Extender: [name] Weeks in Treatment: 2 Vital Signs Time Taken: 12:46 Temperature (F): 98.0 Height (in): 64 Pulse (bpm): 75 Weight (lbs): 204 Respiratory Rate (breaths/min): 18 Body Mass Index (BMI): 35 Blood Pressure (mmHg): 160/85 Reference Range: 80 - 120 mg / dl Electronic Signature(s) Signed: 11/04/2021 4:12:12 PM By: Lorrin Jackson Entered By: Lorrin Jackson on 11/04/2021 12:49:15

## 2021-11-04 NOTE — Progress Notes (Signed)
[name] Houston ([id number]) Visit Report for 11/04/2021 Chief Complaint Document Details Patient Name: Date of Service: [name] Houston, [name] Houston 11/04/2021 12:30 PM Medical Record Number: [id number] Patient Account Number: [id number] Date of Birth/Sex: Treating RN: 1956/02/25 (66 y.o. Sue Lush Primary Care Provider: Harlan Stains Other Clinician: Referring Provider: Treating Provider/Extender: [name] Weeks in Treatment: 2 Information Obtained from: Patient Chief Complaint 10/20/2021; Left posterior leg wound Electronic Signature(s) Signed: 11/04/2021 1:21:19 PM By: Kalman Shan DO Entered By: Kalman Shan on 11/04/2021 13:17:05 -------------------------------------------------------------------------------- HPI Details Patient Name: Date of Service: [name] Cavalier RO N J. 11/04/2021 12:30 PM Medical Record Number: [id number] Patient Account Number: [id number] Date of Birth/Sex: Treating RN: 10/28/1955 (66 y.o. Sue Lush Primary Care Provider: Harlan Stains Other Clinician: Referring Provider: Treating Provider/Extender: [name] Weeks in Treatment: 2 History of Present Illness HPI Description: Admission 06/26/2021 Ms. Araiya Tilmon is a 66 year old female with a past medical history of uncontrolled type 2 diabetes on oral agents with last hemoglobin A1c of 9, peripheral arterial disease that presents to the clinic for a 3-week history of worsening wound to the posterior aspect of her left leg. She is not sure how it started. She has been using normal saline to the wound bed. She had arterial segmental pressures that showed a left ABI of 0.52 she is scheduled to see vein and vascular on 7/5. She is currently on doxycycline prescribed by her primary care doctor. She currently denies systemic signs of infection. 7/7; patient presents for follow-up. She saw Dr. Donzetta Matters yesterday, vein and vascular to discuss her arterial  status. He is scheduling her for an arteriogram for possible intervention. She has had no issues with the Medihoney. Electronic Signature(s) Signed: 11/04/2021 1:21:19 PM By: Kalman Shan DO Entered By: Kalman Shan on 11/04/2021 13:18:44 -------------------------------------------------------------------------------- Physical Exam Details Patient Name: Date of Service: [name] Cavalier RO N J. 11/04/2021 12:30 PM Medical Record Number: [id number] Patient Account Number: [id number] Date of Birth/Sex: Treating RN: May 29, 1955 (66 y.o. Sue Lush Primary Care Provider: Other Clinician: Harlan Stains Referring Provider: Treating Provider/Extender: [name] Weeks in Treatment: 2 Constitutional respirations regular, non-labored and within target range for patient.. Cardiovascular 2+ dorsalis pedis/posterior tibialis pulses. Psychiatric pleasant and cooperative. Notes Left lower extremity: T the posterior aspect there is an open wound with necrotic debris and nonviable tissue throughout the entire surface. Pain on palpation. o No increased warmth, erythema or purulent drainage noted. Electronic Signature(s) Signed: 11/04/2021 1:21:19 PM By: Kalman Shan DO Entered By: Kalman Shan on 11/04/2021 13:19:21 -------------------------------------------------------------------------------- Physician Orders Details Patient Name: Date of Service: [name] Cavalier RO N J. 11/04/2021 12:30 PM Medical Record Number: [id number] Patient Account Number: [id number] Date of Birth/Sex: Treating RN: 08-06-1955 (66 y.o. Sue Lush Primary Care Provider: Harlan Stains Other Clinician: Referring Provider: Treating Provider/Extender: Jake Church in Treatment: 2 Verbal / Phone Orders: No Diagnosis Coding ICD-10 Coding Code Description [phone] Atherosclerosis of native arteries of left leg with ulceration of other part of lower  leg L97.828 Non-pressure chronic ulcer of other part of left lower leg with other specified severity E11.622 Type 2 diabetes mellitus with other skin ulcer Follow-up Appointments ppointment in 2 weeks. - 11/15/21 @ 12:30pm with Dr. Heber Scarbro and Leveda Anna, RN (Room 7) Return A Other: - Angiogram to be scheduled at Dr. Claretha Cooper office. Bathing/ Shower/ Hygiene May shower and wash wound with soap and water. - can use antibacterial soap Edema Control - Lymphedema / SCD /  Other Elevate legs to the level of the heart or above for 30 minutes daily and/or when sitting, a frequency of: - throughout the day Avoid standing for long periods of time. Additional Orders / Instructions Follow Nutritious Diet - Monitor/Control Blood Sugars Wound Treatment Wound #1 - Lower Leg Wound Laterality: Left, Posterior Cleanser: Soap and Water 1 x Per Day/30 Days Discharge Instructions: May shower and wash wound with dial antibacterial soap and water prior to dressing change. Cleanser: Wound Cleanser 1 x Per Day/30 Days Discharge Instructions: Cleanse the wound with wound cleanser prior to applying a clean dressing using gauze sponges, not tissue or cotton balls. Prim Dressing: MediHoney Gel, tube 1.5 (oz) 1 x Per Day/30 Days ary Discharge Instructions: Apply to wound bed as instructed Secondary Dressing: T Non-Adherent Dressing, 3x4 in elfa 1 x Per Day/30 Days Discharge Instructions: Apply over primary dressing as directed. Secured With: 51M Medipore H Soft Cloth Surgical T ape, 4 x 10 (in/yd) 1 x Per Day/30 Days Discharge Instructions: Secure with tape as directed. Electronic Signature(s) Signed: 11/04/2021 1:21:19 PM By: Kalman Shan DO Entered By: Kalman Shan on 11/04/2021 13:19:30 -------------------------------------------------------------------------------- Problem List Details Patient Name: Date of Service: [name] Cavalier RO N J. 11/04/2021 12:30 PM Medical Record Number: [id number] Patient Account  Number: [id number] Date of Birth/Sex: Treating RN: December 13, 1955 (66 y.o. Sue Lush Primary Care Provider: Harlan Stains Other Clinician: Referring Provider: Treating Provider/Extender: [name] Weeks in Treatment: 2 Active Problems ICD-10 Encounter Code Description Active Date MDM Diagnosis I70.248 Atherosclerosis of native arteries of left leg with ulceration of other part of 10/20/2021 No Yes lower leg L97.828 Non-pressure chronic ulcer of other part of left lower leg with other specified 10/20/2021 No Yes severity E11.622 Type 2 diabetes mellitus with other skin ulcer 10/20/2021 No Yes Inactive Problems Resolved Problems Electronic Signature(s) Signed: 11/04/2021 1:21:19 PM By: Kalman Shan DO Entered By: Kalman Shan on 11/04/2021 13:16:50 -------------------------------------------------------------------------------- Progress Note Details Patient Name: Date of Service: [name] Cavalier RO N J. 11/04/2021 12:30 PM Medical Record Number: [id number] Patient Account Number: [id number] Date of Birth/Sex: Treating RN: 06/08/1955 (66 y.o. Sue Lush Primary Care Provider: Harlan Stains Other Clinician: Referring Provider: Treating Provider/Extender: [name] Weeks in Treatment: 2 Subjective Chief Complaint Information obtained from Patient 10/20/2021; Left posterior leg wound History of Present Illness (HPI) Admission 06/26/2021 Ms. Haylen Bellotti is a 66 year old female with a past medical history of uncontrolled type 2 diabetes on oral agents with last hemoglobin A1c of 9, peripheral arterial disease that presents to the clinic for a 3-week history of worsening wound to the posterior aspect of her left leg. She is not sure how it started. She has been using normal saline to the wound bed. She had arterial segmental pressures that showed a left ABI of 0.52 she is scheduled to see vein and vascular on 7/5. She is  currently on doxycycline prescribed by her primary care doctor. She currently denies systemic signs of infection. 7/7; patient presents for follow-up. She saw Dr. Donzetta Matters yesterday, vein and vascular to discuss her arterial status. He is scheduling her for an arteriogram for possible intervention. She has had no issues with the Medihoney. Patient History Information obtained from Patient, Caregiver. Family History Cancer - Father, Diabetes - Mother,Maternal Grandparents,Father, Hypertension - Mother, No family history of Heart Disease, Hereditary Spherocytosis, Kidney Disease, Lung Disease, Seizures, Stroke, Thyroid Problems, Tuberculosis. Social History Former smoker, Marital Status - Married, Alcohol Use - Rarely, Drug Use - No  History, Caffeine Use - Daily. Medical History Eyes Denies history of Cataracts, Glaucoma, Optic Neuritis Cardiovascular Patient has history of Hypertension, Peripheral Arterial Disease, Peripheral Venous Disease Denies history of Angina, Arrhythmia, Coronary Artery Disease, Deep Vein Thrombosis, Hypotension, Myocardial Infarction, Phlebitis, Vasculitis Gastrointestinal Denies history of Cirrhosis , Colitis, Crohnoos, Hepatitis A, Hepatitis B, Hepatitis C Endocrine Patient has history of Type II Diabetes - A1C-9.1 Denies history of Type I Diabetes Immunological Denies history of Lupus Erythematosus, Raynaudoos, Scleroderma Musculoskeletal Patient has history of Osteoarthritis, Osteomyelitis - Hx ankle Denies history of Gout, Rheumatoid Arthritis Neurologic Denies history of Neuropathy, Quadriplegia, Paraplegia, Seizure Disorder Hospitalization/Surgery History - left knee. Medical A Surgical History Notes nd Genitourinary CKD-3 Objective Constitutional respirations regular, non-labored and within target range for patient.. Vitals Time Taken: 12:46 PM, Height: 64 in, Weight: 204 lbs, BMI: 35, Temperature: 98.0 F, Pulse: 75 bpm, Respiratory Rate: 18  breaths/min, Blood Pressure: 160/85 mmHg. Cardiovascular 2+ dorsalis pedis/posterior tibialis pulses. Psychiatric pleasant and cooperative. General Notes: Left lower extremity: T the posterior aspect there is an open wound with necrotic debris and nonviable tissue throughout the entire surface. o Pain on palpation. No increased warmth, erythema or purulent drainage noted. Integumentary (Hair, Skin) Wound #1 status is Open. Original cause of wound was Gradually Appeared. The date acquired was: 09/29/2021. The wound has been in treatment 2 weeks. The wound is located on the Left,Posterior Lower Leg. The wound measures 3.7cm length x 2.7cm width x 0.1cm depth; 7.846cm^2 area and 0.785cm^3 volume. There is Fat Layer (Subcutaneous Tissue) exposed. There is no tunneling or undermining noted. There is a medium amount of serosanguineous drainage noted. The wound margin is distinct with the outline attached to the wound base. There is no granulation within the wound bed. There is a large (67-100%) amount of necrotic tissue within the wound bed including Eschar and Adherent Slough. Assessment Active Problems ICD-10 Atherosclerosis of native arteries of left leg with ulceration of other part of lower leg Non-pressure chronic ulcer of other part of left lower leg with other specified severity Type 2 diabetes mellitus with other skin ulcer Patient's wound is stable. She is going to be scheduled for an arteriogram. In the meantime I will hold off on debridement and aggressive care until she has adequate blood flow for healing. She may continue Medihoney. Follow-up in 2 weeks. Plan Follow-up Appointments: Return Appointment in 2 weeks. - 11/15/21 @ 12:30pm with Dr. Heber Huguley and Leveda Anna, RN (Room 7) Other: - Angiogram to be scheduled at Dr. Claretha Cooper office. Bathing/ Shower/ Hygiene: May shower and wash wound with soap and water. - can use antibacterial soap Edema Control - Lymphedema / SCD / Other: Elevate legs  to the level of the heart or above for 30 minutes daily and/or when sitting, a frequency of: - throughout the day Avoid standing for long periods of time. Additional Orders / Instructions: Follow Nutritious Diet - Monitor/Control Blood Sugars WOUND #1: - Lower Leg Wound Laterality: Left, Posterior Cleanser: Soap and Water 1 x Per Day/30 Days Discharge Instructions: May shower and wash wound with dial antibacterial soap and water prior to dressing change. Cleanser: Wound Cleanser 1 x Per Day/30 Days Discharge Instructions: Cleanse the wound with wound cleanser prior to applying a clean dressing using gauze sponges, not tissue or cotton balls. Prim Dressing: MediHoney Gel, tube 1.5 (oz) 1 x Per Day/30 Days ary Discharge Instructions: Apply to wound bed as instructed Secondary Dressing: T Non-Adherent Dressing, 3x4 in 1 x Per Day/30 Days elfa Discharge Instructions:  Apply over primary dressing as directed. Secured With: 48M Medipore H Soft Cloth Surgical T ape, 4 x 10 (in/yd) 1 x Per Day/30 Days Discharge Instructions: Secure with tape as directed. 1. Medihoney 2. Follow-up in 2 weeks Electronic Signature(s) Signed: 11/04/2021 1:21:19 PM By: Kalman Shan DO Entered By: Kalman Shan on 11/04/2021 13:20:42 -------------------------------------------------------------------------------- HxROS Details Patient Name: Date of Service: [name] Cavalier RO N J. 11/04/2021 12:30 PM Medical Record Number: [id number] Patient Account Number: [id number] Date of Birth/Sex: Treating RN: 1956-01-18 (66 y.o. Sue Lush Primary Care Provider: Harlan Stains Other Clinician: Referring Provider: Treating Provider/Extender: Jake Church in Treatment: 2 Information Obtained From Patient Caregiver Eyes Medical History: Negative for: Cataracts; Glaucoma; Optic Neuritis Cardiovascular Medical History: Positive for: Hypertension; Peripheral Arterial Disease; Peripheral  Venous Disease Negative for: Angina; Arrhythmia; Coronary Artery Disease; Deep Vein Thrombosis; Hypotension; Myocardial Infarction; Phlebitis; Vasculitis Gastrointestinal Medical History: Negative for: Cirrhosis ; Colitis; Crohns; Hepatitis A; Hepatitis B; Hepatitis C Endocrine Medical History: Positive for: Type II Diabetes - A1C-9.1 Negative for: Type I Diabetes Time with diabetes: 12 years Treated with: Oral agents Blood sugar tested every day: No Genitourinary Medical History: Past Medical History Notes: CKD-3 Immunological Medical History: Negative for: Lupus Erythematosus; Raynauds; Scleroderma Musculoskeletal Medical History: Positive for: Osteoarthritis; Osteomyelitis - Hx ankle Negative for: Gout; Rheumatoid Arthritis Neurologic Medical History: Negative for: Neuropathy; Quadriplegia; Paraplegia; Seizure Disorder Immunizations Pneumococcal Vaccine: Received Pneumococcal Vaccination: No Implantable Devices None Hospitalization / Surgery History Type of Hospitalization/Surgery left knee Family and Social History Cancer: Yes - Father; Diabetes: Yes - Mother,Maternal Grandparents,Father; Heart Disease: No; Hereditary Spherocytosis: No; Hypertension: Yes - Mother; Kidney Disease: No; Lung Disease: No; Seizures: No; Stroke: No; Thyroid Problems: No; Tuberculosis: No; Former smoker; Marital Status - Married; Alcohol Use: Rarely; Drug Use: No History; Caffeine Use: Daily; Financial Concerns: No; Food, Clothing or Shelter Needs: No; Support System Lacking: No; Transportation Concerns: No Electronic Signature(s) Signed: 11/04/2021 1:21:19 PM By: Kalman Shan DO Signed: 11/04/2021 4:12:12 PM By: Lorrin Jackson Entered By: Kalman Shan on 11/04/2021 13:18:51 -------------------------------------------------------------------------------- SuperBill Details Patient Name: Date of Service: [name]. 11/04/2021 Medical Record Number: [id number] Patient Account  Number: [id number] Date of Birth/Sex: Treating RN: Sep 19, 1955 (66 y.o. Sue Lush Primary Care Provider: Harlan Stains Other Clinician: Referring Provider: Treating Provider/Extender: [name] Weeks in Treatment: 2 Diagnosis Coding ICD-10 Codes Code Description [id number] Atherosclerosis of native arteries of left leg with ulceration of other part of lower leg L97.828 Non-pressure chronic ulcer of other part of left lower leg with other specified severity E11.622 Type 2 diabetes mellitus with other skin ulcer Facility Procedures CPT4 Code: [id number] Description: 99213 - WOUND CARE VISIT-LEV 3 EST PT Modifier: Quantity: 1 Physician Procedures : CPT4 Code Description Modifier [id number] 99213 - WC PHYS LEVEL 3 - EST PT ICD-10 Diagnosis Description I70.248 Atherosclerosis of native arteries of left leg with ulceration of other part of lower leg L97.828 Non-pressure chronic ulcer of other part of  left lower leg with other specified severity E11.622 Type 2 diabetes mellitus with other skin ulcer Quantity: 1 Electronic Signature(s) Signed: 11/04/2021 1:21:19 PM By: Kalman Shan DO Entered By: Kalman Shan on 11/04/2021 13:20:55

## 2021-11-08 DIAGNOSIS — E1169 Type 2 diabetes mellitus with other specified complication: Secondary | ICD-10-CM | POA: Diagnosis not present

## 2021-11-08 DIAGNOSIS — K219 Gastro-esophageal reflux disease without esophagitis: Secondary | ICD-10-CM | POA: Diagnosis not present

## 2021-11-08 DIAGNOSIS — E785 Hyperlipidemia, unspecified: Secondary | ICD-10-CM | POA: Diagnosis not present

## 2021-11-08 DIAGNOSIS — N183 Chronic kidney disease, stage 3 unspecified: Secondary | ICD-10-CM | POA: Diagnosis not present

## 2021-11-08 DIAGNOSIS — I129 Hypertensive chronic kidney disease with stage 1 through stage 4 chronic kidney disease, or unspecified chronic kidney disease: Secondary | ICD-10-CM | POA: Diagnosis not present

## 2021-11-14 ENCOUNTER — Encounter (HOSPITAL_COMMUNITY)
Admission: RE | Disposition: A | Discharge status: Home / Self Care | Payer: Self-pay | Source: Ambulatory Visit | Attending: Vascular Surgery

## 2021-11-14 ENCOUNTER — Other Ambulatory Visit: Payer: Self-pay

## 2021-11-14 ENCOUNTER — Ambulatory Visit (HOSPITAL_BASED_OUTPATIENT_CLINIC_OR_DEPARTMENT_OTHER): Payer: HMO

## 2021-11-14 ENCOUNTER — Ambulatory Visit (HOSPITAL_COMMUNITY)
Admission: RE | Admit: 2021-11-14 | Discharge: 2021-11-14 | Disposition: A | Discharge status: Home / Self Care | Payer: HMO | Source: Ambulatory Visit | Attending: Vascular Surgery | Admitting: Vascular Surgery

## 2021-11-14 DIAGNOSIS — L97229 Non-pressure chronic ulcer of left calf with unspecified severity: Secondary | ICD-10-CM | POA: Diagnosis not present

## 2021-11-14 DIAGNOSIS — I70242 Atherosclerosis of native arteries of left leg with ulceration of calf: Secondary | ICD-10-CM | POA: Insufficient documentation

## 2021-11-14 DIAGNOSIS — I70262 Atherosclerosis of native arteries of extremities with gangrene, left leg: Secondary | ICD-10-CM

## 2021-11-14 DIAGNOSIS — Z87891 Personal history of nicotine dependence: Secondary | ICD-10-CM | POA: Diagnosis not present

## 2021-11-14 DIAGNOSIS — Z7984 Long term (current) use of oral hypoglycemic drugs: Secondary | ICD-10-CM | POA: Insufficient documentation

## 2021-11-14 DIAGNOSIS — I739 Peripheral vascular disease, unspecified: Secondary | ICD-10-CM | POA: Diagnosis not present

## 2021-11-14 DIAGNOSIS — I10 Essential (primary) hypertension: Secondary | ICD-10-CM | POA: Diagnosis not present

## 2021-11-14 DIAGNOSIS — E1151 Type 2 diabetes mellitus with diabetic peripheral angiopathy without gangrene: Secondary | ICD-10-CM | POA: Insufficient documentation

## 2021-11-14 DIAGNOSIS — Z01810 Encounter for preprocedural cardiovascular examination: Secondary | ICD-10-CM | POA: Diagnosis not present

## 2021-11-14 DIAGNOSIS — E11622 Type 2 diabetes mellitus with other skin ulcer: Secondary | ICD-10-CM | POA: Insufficient documentation

## 2021-11-14 DIAGNOSIS — E785 Hyperlipidemia, unspecified: Secondary | ICD-10-CM | POA: Insufficient documentation

## 2021-11-14 HISTORY — PX: ABDOMINAL AORTOGRAM W/LOWER EXTREMITY: CATH118223

## 2021-11-14 LAB — POCT I-STAT, CHEM 8
BUN: 34 mg/dL — ABNORMAL HIGH (ref 8–23)
Calcium, Ion: 1.28 mmol/L (ref 1.15–1.40)
Chloride: 100 mmol/L (ref 98–111)
Creatinine, Ser: 1.6 mg/dL — ABNORMAL HIGH (ref 0.44–1.00)
Glucose, Bld: 137 mg/dL — ABNORMAL HIGH (ref 70–99)
HCT: 47 % — ABNORMAL HIGH (ref 36.0–46.0)
Hemoglobin: 16 g/dL — ABNORMAL HIGH (ref 12.0–15.0)
Potassium: 4.3 mmol/L (ref 3.5–5.1)
Sodium: 138 mmol/L (ref 135–145)
TCO2: 29 mmol/L (ref 22–32)

## 2021-11-14 LAB — GLUCOSE, CAPILLARY: Glucose-Capillary: 139 mg/dL — ABNORMAL HIGH (ref 70–99)

## 2021-11-14 SURGERY — ABDOMINAL AORTOGRAM W/LOWER EXTREMITY
Anesthesia: LOCAL

## 2021-11-14 MED ORDER — FENTANYL CITRATE (PF) 100 MCG/2ML IJ SOLN
INTRAMUSCULAR | Status: DC | PRN
Start: 2021-11-14 — End: 2021-11-14
  Administered 2021-11-14: 50 ug via INTRAVENOUS

## 2021-11-14 MED ORDER — OXYCODONE HCL 5 MG PO TABS
5.0000 mg | ORAL_TABLET | ORAL | Status: DC | PRN
  Administered 2021-11-14 (×2): 5 mg via ORAL
  Filled 2021-11-14: qty 1

## 2021-11-14 MED ORDER — MORPHINE SULFATE (PF) 2 MG/ML IV SOLN: 2.0000 mg | INTRAVENOUS | Status: DC | PRN

## 2021-11-14 MED ORDER — IODIXANOL 320 MG/ML IV SOLN
INTRAVENOUS | Status: DC | PRN
  Administered 2021-11-14: 50 mL via INTRA_ARTERIAL

## 2021-11-14 MED ORDER — FENTANYL CITRATE (PF) 100 MCG/2ML IJ SOLN
INTRAMUSCULAR | Status: AC
  Filled 2021-11-14: qty 2

## 2021-11-14 MED ORDER — MIDAZOLAM HCL 2 MG/2ML IJ SOLN
INTRAMUSCULAR | Status: AC
  Filled 2021-11-14: qty 2

## 2021-11-14 MED ORDER — SODIUM CHLORIDE 0.9% FLUSH: 3.0000 mL | Freq: Two times a day (BID) | INTRAVENOUS | Status: DC

## 2021-11-14 MED ORDER — SODIUM CHLORIDE 0.9 % WEIGHT BASED INFUSION
1.0000 mL/kg/h | INTRAVENOUS | Status: DC
Start: 2021-11-14 — End: 2021-11-14

## 2021-11-14 MED ORDER — SODIUM CHLORIDE 0.9 % IV SOLN: INTRAVENOUS | Status: DC

## 2021-11-14 MED ORDER — LIDOCAINE HCL (PF) 1 % IJ SOLN
INTRAMUSCULAR | Status: DC | PRN
  Administered 2021-11-14: 15 mL via INTRADERMAL

## 2021-11-14 MED ORDER — MIDAZOLAM HCL 2 MG/2ML IJ SOLN
INTRAMUSCULAR | Status: DC | PRN
  Administered 2021-11-14: 1 mg via INTRAVENOUS

## 2021-11-14 MED ORDER — HEPARIN (PORCINE) IN NACL 1000-0.9 UT/500ML-% IV SOLN
INTRAVENOUS | Status: DC | PRN
  Administered 2021-11-14 (×2): 500 mL

## 2021-11-14 MED ORDER — HEPARIN (PORCINE) IN NACL 1000-0.9 UT/500ML-% IV SOLN
INTRAVENOUS | Status: AC
  Filled 2021-11-14: qty 1000

## 2021-11-14 MED ORDER — OXYCODONE HCL 5 MG PO TABS
ORAL_TABLET | ORAL | Status: AC
  Filled 2021-11-14: qty 1

## 2021-11-14 MED ORDER — SODIUM CHLORIDE 0.9% FLUSH: 3.0000 mL | INTRAVENOUS | Status: DC | PRN

## 2021-11-14 MED ORDER — ACETAMINOPHEN 325 MG PO TABS: 650.0000 mg | ORAL_TABLET | ORAL | Status: DC | PRN

## 2021-11-14 MED ORDER — MORPHINE SULFATE (PF) 2 MG/ML IV SOLN
INTRAVENOUS | Status: AC
  Filled 2021-11-14: qty 1

## 2021-11-14 MED ORDER — LIDOCAINE HCL (PF) 1 % IJ SOLN
INTRAMUSCULAR | Status: AC
  Filled 2021-11-14: qty 30

## 2021-11-14 MED ORDER — SODIUM CHLORIDE 0.9 % IV SOLN: 250.0000 mL | INTRAVENOUS | Status: DC | PRN

## 2021-11-14 SURGICAL SUPPLY — 16 items
CATH NAVICROSS ST 65CM (CATHETERS) IMPLANT
CATH OMNI FLUSH 5F 65CM (CATHETERS) ×1 IMPLANT
CATH SOFT-VU 4F 65 STRAIGHT (CATHETERS) IMPLANT
CATH SOFT-VU STRAIGHT 4F 65CM (CATHETERS) ×2
CATHETER NAVICROSS ST 65CM (CATHETERS) ×2
GUIDEWIRE ANGLED .035X150CM (WIRE) ×1 IMPLANT
KIT ANGIASSIST CO2 SYSTEM (KITS) ×1 IMPLANT
KIT MICROPUNCTURE NIT STIFF (SHEATH) ×1 IMPLANT
KIT PV (KITS) ×2 IMPLANT
SHEATH PINNACLE 5F 10CM (SHEATH) ×1 IMPLANT
SHEATH PROBE COVER 6X72 (BAG) ×1 IMPLANT
STOPCOCK MORSE 400PSI 3WAY (MISCELLANEOUS) ×1 IMPLANT
SYR MEDRAD MARK 7 150ML (SYRINGE) ×2 IMPLANT
TRANSDUCER W/STOPCOCK (MISCELLANEOUS) ×2 IMPLANT
TRAY PV CATH (CUSTOM PROCEDURE TRAY) ×2 IMPLANT
WIRE BENTSON .035X145CM (WIRE) ×1 IMPLANT

## 2021-11-14 NOTE — Progress Notes (Signed)
SITE AREA: right femoral  SITE PRIOR TO REMOVAL:  LEVEL 0  PRESSURE APPLIED FOR: approximately 20 minutes  MANUAL: yes  PATIENT STATUS DURING PULL: stable  POST PULL SITE:  LEVEL 0  POST PULL INSTRUCTIONS GIVEN: yes  POST PULL PULSES PRESENT: right post tibial dopplerable (faint), left popliteal palpable (faint)   DRESSING APPLIED: gauze with tegaderm  BEDREST BEGINS @ 1214  COMMENTS:

## 2021-11-14 NOTE — Progress Notes (Signed)
VASCULAR LAB    Left lower extremity vein mapping has been performed.  See CV proc for preliminary results.   Shateria Paternostro, RVT 11/14/2021, 11:57 AM

## 2021-11-14 NOTE — Interval H&P Note (Signed)
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  The various methods of treatment have been discussed with the patient and family. After consideration of risks, benefits and other options for treatment, the patient has consented to  Procedure(s): ABDOMINAL AORTOGRAM W/LOWER EXTREMITY (N/A) as a surgical intervention.  The patient's history has been reviewed, patient examined, no change in status, stable for surgery.  I have reviewed the patient's chart and labs.  Questions were answered to the patient's satisfaction.     Servando Snare

## 2021-11-14 NOTE — Op Note (Signed)
        Patient name: [name] Houston MRN: [id number] DOB: [birth year] Sex: female  11/14/2021 Pre-operative Diagnosis: Critical left lower extremity ischemia with ulceration Post-operative diagnosis:  Same Surgeon:  [name] Quan C. Donzetta Matters, MD Procedure Performed: 1.  Ultrasound-guided cannulation right common femoral artery 2.  Aortogram 3.  Selection of left common femoral artery and left lower extremity angiogram 4.  Moderate sedation with fentanyl and Versed for 26 minutes  Indications: 66 year old female without previous vascular disease now has a wound on her posterior leg with ABI in the 0.5 range.  She is does not have a palpable left common femoral pulse she is indicated for angiography with contrast and CO2 for which she presents today.  Findings: The aorta and iliac segments are heavily calcified however there does not appear to be any flow-limiting stenosis without CO2 or contrast.  The left common femoral artery is subtotally occluded the SFA appears to be flush occluded reconstitutes a heavily diseased artery above the knee.  There appears to be three-vessel runoff to the left foot.  We will plan for left common femoral endarterectomy and left common femoral to below-knee popliteal artery bypass.   Procedure:  The patient was identified in the holding area and taken to room 8.  The patient was then placed supine on the table and prepped and draped in the usual sterile fashion.  A time out was called.  Ultrasound was used to evaluate the right common femoral artery which was heavily calcified.  The area was anesthetized with 1% lidocaine and cannulated micropuncture needle followed by wire and the sheath.  And images saved to the permanent record.  Concomitantly fentanyl and Versed was administered moderate sedation was provided for 26 minutes and her vital signs were monitored throughout the case.  Bentson wires placed followed by 5 French sheath and Omni catheter to the level of L1  and CO2 aortogram initially performed followed by pelvic angiography followed by contrasted pelvic angiography.  We then crossed the bifurcation using a Glidewire and Ferd Hibbs cross catheter and perform left lower extremity angiography with the above findings.  We will plan for left common femoral endarterectomy and left lower extremity bypass in the near future.  Contrast: 50c  Tami Barren C. Donzetta Matters, MD [facility] Office: [phone] Pager: [phone]

## 2021-11-15 ENCOUNTER — Encounter (HOSPITAL_COMMUNITY): Payer: Self-pay | Admitting: Vascular Surgery

## 2021-11-15 ENCOUNTER — Encounter (HOSPITAL_BASED_OUTPATIENT_CLINIC_OR_DEPARTMENT_OTHER): Payer: Self-pay

## 2021-11-15 ENCOUNTER — Encounter (HOSPITAL_BASED_OUTPATIENT_CLINIC_OR_DEPARTMENT_OTHER): Payer: HMO | Admitting: Internal Medicine

## 2021-11-16 ENCOUNTER — Telehealth: Payer: Self-pay

## 2021-11-16 NOTE — Telephone Encounter (Signed)
Received call from Claudette Stapler of [facility].  Dr. Dione Plover requesting Pt's cardiology visit get moved up, as 12/06/21 cardiology appointment may be needed as a vascular surgery date instead.    Pt called at home, and pt stated she can see Dr. Radford Pax for pre-surgery cardiology visit this Friday, July 21 at 1130 am.    This appointment was scheduled in Epic per Pt verifying ok, secure message sent back to Claudette Stapler to make her team aware the cardiology appointment has been moved.    No f/u required at this time.

## 2021-11-16 NOTE — Telephone Encounter (Signed)
Pt called to schedule surgery. I explained to her she needs cardiac clearance first. Referral has been placed and she will call heart care to schedule her appt. She is needing work notes for her [employer] from 7/17 and for her upcoming surgery. We discussed we would provide this but since we do not know her surgery date yet, we would not include that in her return to work letter. Pt verbalized understanding and will call back when she needs letter.

## 2021-11-18 ENCOUNTER — Encounter: Payer: Self-pay | Admitting: Cardiology

## 2021-11-18 ENCOUNTER — Ambulatory Visit (INDEPENDENT_AMBULATORY_CARE_PROVIDER_SITE_OTHER): Payer: PPO | Admitting: Cardiology

## 2021-11-18 VITALS — BP 142/82 | HR 52 | Ht 64.0 in | Wt 198.0 lb

## 2021-11-18 DIAGNOSIS — E7800 Pure hypercholesterolemia, unspecified: Secondary | ICD-10-CM | POA: Diagnosis not present

## 2021-11-18 DIAGNOSIS — Z01818 Encounter for other preprocedural examination: Secondary | ICD-10-CM | POA: Diagnosis not present

## 2021-11-18 DIAGNOSIS — I739 Peripheral vascular disease, unspecified: Secondary | ICD-10-CM | POA: Diagnosis not present

## 2021-11-18 DIAGNOSIS — I10 Essential (primary) hypertension: Secondary | ICD-10-CM | POA: Diagnosis not present

## 2021-11-18 NOTE — Addendum Note (Signed)
Addended by: Antonieta Iba on: 11/18/2021 12:06 PM   Modules accepted: Orders

## 2021-11-18 NOTE — Progress Notes (Signed)
Cardiology CONSULT Note    Date:  11/18/2021   ID:  [name] Houston, DOB [birth year], MRN [id number]  PCP:  Harlan Stains, MD  Cardiologist:  Fransico Him, MD   Chief Complaint  Patient presents with   New Patient (Initial Visit)    Preoperative cardiac clearance    History of Present Illness:  [name] Houston is a 66 y.o. female who is being seen today for the evaluation of surgical cardiac clearance at the request of [name] Snare, MD.  This is a 66 year old African-American female with a history of diabetes mellitus, hypertension, hyperlipidemia and former smoker who quit a few weeks ago.  She has had a wound on the left posterior calf and has been followed with the wound care center.  This has been slow to heal.  She was referred to vascular surgery for of the evaluation of possible multilevel PAD given prolonged wound healing and no palpable left common femoral pulse.  ABI of the left leg was 0.5.  On 11/14/2021 she underwent left common femoral artery and left lower extremity angiogram.  This revealed aortic and iliac segments heavily calcified but with no flow-limiting stenosis.  She does have left common femoral artery that is subtotally occluded but reconstitutes a heavily diseased artery above the knee.  Plan is for left common femoral endarterectomy and left common femoral to below the knee popliteal artery bypass.  She is referred here for cardiac clearance prior to undergoing surgery given her high risk for underlying CAD. She denies any chest pain or pressure, SOB, DOE, PND, orthopnea, dizziness, palpitations or syncope. She has had some LLE edema in the area of her skin wound.  Currently she is walking with a cane.  She is compliant with her meds and is tolerating meds with no SE.     Past Medical History:  Diagnosis Date   Diabetes mellitus without complication ([place])    Hiatal hernia 2015   DG Esophagus    Hyperlipidemia    Hypertension    Kidney disease  2013    Past Surgical History:  Procedure Laterality Date   ABDOMINAL AORTOGRAM W/LOWER EXTREMITY N/A 11/14/2021   Procedure: ABDOMINAL AORTOGRAM W/LOWER EXTREMITY;  Surgeon: Waynetta Sandy, MD;  Location: [facility];  Service: Cardiovascular;  Laterality: N/A;   BREAST ABSESS DRAINED  7/08   CHOLECYSTECTOMY     ORIF TIBIA PLATEAU  3/09    Current Medications: Current Meds  Medication Sig   acetaminophen (TYLENOL) 500 MG tablet Take 1,000 mg by mouth every 6 (six) hours as needed for moderate pain.   amLODipine (NORVASC) 10 MG tablet Take 10 mg by mouth daily.   aspirin 81 MG tablet Take 81 mg by mouth daily.   atorvastatin (LIPITOR) 40 MG tablet Take 40 mg by mouth daily.   chlorthalidone (HYGROTON) 25 MG tablet Take 25 mg by mouth daily.   cholecalciferol (VITAMIN D3) 25 MCG (1000 UNIT) tablet Take 1,000 Units by mouth daily.   Cyanocobalamin (B-12-SL) 1000 MCG SUBL Place 1,000 mcg under the tongue daily.   dapagliflozin propanediol (FARXIGA) 10 MG TABS tablet Take 10 mg by mouth daily.   famotidine (PEPCID) 40 MG tablet Take 40 mg by mouth See admin instructions. Take 40 mg daily, may take a second 40 mg dose as needed for heartburn   glipiZIDE (GLUCOTROL) 5 MG tablet Take 5 mg by mouth 2 (two) times daily.   HYDROcodone-acetaminophen (NORCO/VICODIN) 5-325 MG tablet Take 1 tablet by mouth 3 (  three) times daily as needed for pain.   Magnesium 250 MG TABS Take 250 mg by mouth 2 (two) times daily.   pioglitazone (ACTOS) 15 MG tablet Take 15 mg by mouth daily.   potassium chloride SA (KLOR-CON M) 20 MEQ tablet Take 20 mEq by mouth daily.   Semaglutide,0.25 or 0.'5MG'$ /DOS, (OZEMPIC, 0.25 OR 0.5 MG/DOSE,) 2 MG/3ML SOPN Take 0.5 mg by mouth every Thursday.    Allergies:   Patient has no known allergies.   Social History   Socioeconomic History   Marital status: Married    Spouse name: Not on file   Number of children: Not on file   Years of education: Not on file    Highest education level: Not on file  Occupational History   Not on file  Tobacco Use   Smoking status: Former    Packs/day: 0.50    Types: Cigarettes   Smokeless tobacco: Never  Substance and Sexual Activity   Alcohol use: Yes    Comment: occ   Drug use: No   Sexual activity: Not on file  Other Topics Concern   Not on file  Social History Narrative   Not on file   Social Determinants of Health   Financial Resource Strain: Not on file  Food Insecurity: Not on file  Transportation Needs: Not on file  Physical Activity: Not on file  Stress: Not on file  Social Connections: Not on file     Family History:  The patient's family history includes Bone cancer in her maternal aunt; CVA in her father; Diabetes in her father, mother, and paternal grandmother; Hypertension in her father and mother; Pancreatic cancer in her father; Pulmonary embolism in her father.   ROS:   Please see the history of present illness.    ROS All other systems reviewed and are negative.      No data to display             PHYSICAL EXAM:   VS:  BP (!) 142/82   Pulse (!) 52   Ht '5\' 4"'$  (1.626 m)   Wt 198 lb (89.8 kg)   LMP 01/18/2011   BMI 33.99 kg/m    GEN: Well nourished, well developed, in no acute distress  HEENT: normal  Neck: no JVD, carotid bruits, or masses Cardiac: RRR; no murmurs, rubs, or gallops,no edema.  Intact distal pulses bilaterally.  Respiratory:  clear to auscultation bilaterally, normal work of breathing GI: soft, nontender, nondistended, + BS MS: no deformity or atrophy  Skin: warm and dry, no rash Neuro:  Alert and Oriented x 3, Strength and sensation are intact Psych: euthymic mood, full affect  Wt Readings from Last 3 Encounters:  11/18/21 198 lb (89.8 kg)  11/14/21 193 lb (87.5 kg)  11/02/21 203 lb (92.1 kg)      Studies/Labs Reviewed:   EKG:  EKG is not  ordered today.  The ekg ordered 11/14/2021 demonstrates normal sinus rhythm with occasional PVCs  and no ST changes  Recent Labs: 11/14/2021: BUN 34; Creatinine, Ser 1.60; Hemoglobin 16.0; Potassium 4.3; Sodium 138   Lipid Panel No results found for: "CHOL", "TRIG", "HDL", "CHOLHDL", "VLDL", "LDLCALC", "LDLDIRECT"   Additional studies/ records that were reviewed today include:  Lower extremity angiogram, office notes from vascular surgery and EKG from 11/14/2021    ASSESSMENT:    1. Preoperative clearance   2. PAD (peripheral artery disease) ([place])   3. Essential hypertension   4. Pure hypercholesterolemia  PLAN:  In order of problems listed above:  Preoperative cardiac clearance -She does have multiple cardiac risk factors  -Her perioperative risk of a major cardiac event is 0.9% according to the Revised Cardiac Risk Index (RCRI).  Therefore, the patient is at low risk for perioperative complications.   The patient's  functional capacity is good at 6.05 METs according to the Duke Activity Status Index (DASI) prior to a month ago when her left leg started to bother her. Recommendations: According to ACC/AHA guidelines, no further cardiovascular testing needed.  The patient may proceed to surgery at acceptable risk.    -given her multiple CRFs I will set her up for a coronary Ca score to define future cardiac risk  2.  PAD -she has had a prolonged healing left lower extremity wound followed in wound clinic -Found to have left common femoral artery that is subtotally occluded but reconstitutes a heavily diseased artery above the knee on angiography.   -Plan is for left common femoral endarterectomy and left common femoralto below the knee popliteal artery bypass by Dr. Donzetta Matters, MD  3.  Hypertension -BP is controlled on exam today -Continue prescription drug management with amlodipine 10 mg daily, chlorthalidone 25 mg daily with as needed refills  4.  Hyperlipidemia -LDL goal is less than 70 due to PAD -Continue prescription drug management with atorvastatin 40 mg daily  with as needed refills.  Time Spent: 20 minutes total time of encounter, including 15 minutes spent in face-to-face patient care on the date of this encounter. This time includes coordination of care and counseling regarding above mentioned problem list. Remainder of non-face-to-face time involved reviewing chart documents/testing relevant to the patient encounter and documentation in the medical record. I have independently reviewed documentation from referring provider  Medication Adjustments/Labs and Tests Ordered: Current medicines are reviewed at length with the patient today.  Concerns regarding medicines are outlined above.  Medication changes, Labs and Tests ordered today are listed in the Patient Instructions below.  There are no Patient Instructions on file for this visit.   Signed, Fransico Him, MD  11/18/2021 11:58 AM    [address], [place], [place]  [address] Phone: [phone]; Fax: [phone]

## 2021-11-18 NOTE — Patient Instructions (Addendum)
Medication Instructions:  Your physician recommends that you continue on your current medications as directed. Please refer to the Current Medication list given to you today.  *If you need a refill on your cardiac medications before your next appointment, please call your pharmacy*  Testing/Procedures: Your physician has requested that you have a calcium score CT scan. There is a $99 fee for the scan.   Follow-Up: At [facility], you and your health needs are our priority.  As part of our continuing mission to provide you with exceptional heart care, we have created designated Provider Care Teams.  These Care Teams include your primary Cardiologist (physician) and Advanced Practice Providers (APPs -  Physician Assistants and Nurse Practitioners) who all work together to provide you with the care you need, when you need it.  Follow up with Dr. Radford Pax has needed based on results of testing.   Important Information About Sugar

## 2021-11-21 ENCOUNTER — Encounter (HOSPITAL_BASED_OUTPATIENT_CLINIC_OR_DEPARTMENT_OTHER): Payer: HMO | Admitting: Internal Medicine

## 2021-11-21 DIAGNOSIS — I70248 Atherosclerosis of native arteries of left leg with ulceration of other part of lower left leg: Secondary | ICD-10-CM | POA: Diagnosis not present

## 2021-11-21 DIAGNOSIS — L97828 Non-pressure chronic ulcer of other part of left lower leg with other specified severity: Secondary | ICD-10-CM | POA: Diagnosis not present

## 2021-11-21 DIAGNOSIS — E11622 Type 2 diabetes mellitus with other skin ulcer: Secondary | ICD-10-CM | POA: Diagnosis not present

## 2021-11-21 NOTE — Progress Notes (Signed)
[name], Houston ([id number]) Visit Report for 11/21/2021 Chief Complaint Document Details Patient Name: Date of Service: [name] Houston 11/21/2021 2:30 PM Medical Record Number: [id number] Patient Account Number: [id number] Date of Birth/Sex: Treating RN: 02-Aug-1955 (66 y.o. F) Primary Care Provider: Harlan Houston Other Clinician: Referring Provider: Treating Provider/Extender: [name] Houston Weeks in Treatment: 4 Information Obtained from: Patient Chief Complaint 10/20/2021; Left posterior leg wound Electronic Signature(s) Signed: 11/21/2021 3:08:01 PM By: [name] Shan DO Entered By: [name] Houston on 11/21/2021 14:59:36 -------------------------------------------------------------------------------- HPI Details Patient Name: Date of Service: [name] Cavalier RO N J. 11/21/2021 2:30 PM Medical Record Number: [id number] Patient Account Number: [id number] Date of Birth/Sex: Treating RN: May 22, 1955 (66 y.o. F) Primary Care Provider: Harlan Houston Other Clinician: Referring Provider: Treating Provider/Extender: [name] Houston in Treatment: 4 History of Present Illness HPI Description: Admission 06/26/2021 [name] Houston is a 66 year old female with a past medical history of uncontrolled type 2 diabetes on oral agents with last hemoglobin A1c of 9, peripheral arterial disease that presents to the clinic for a 3-week history of worsening wound to the posterior aspect of her left leg. She is not sure how it started. She has been using normal saline to the wound bed. She had arterial segmental pressures that showed a left ABI of 0.52 she is scheduled to see vein and vascular on 7/5. She is currently on doxycycline prescribed by her primary care doctor. She currently denies systemic signs of infection. 7/7; patient presents for follow-up. She saw Dr. Donzetta Houston yesterday, vein and vascular to discuss her arterial status. He is scheduling her  for an arteriogram for possible intervention. She has had no issues with the Medihoney. 7/24; patient presents for follow-up. She continues to use Medihoney. She is scheduled to have a stress test on 7/28 in order to be cleared for vascular surgery. Plan is for left common femoral endarterectomy and left common femoral to below the knee popliteal artery bypass. Electronic Signature(s) Signed: 11/21/2021 3:08:01 PM By: [name] Shan DO Entered By: [name] Houston on 11/21/2021 15:02:25 -------------------------------------------------------------------------------- Physical Exam Details Patient Name: Date of Service: [name] Cavalier RO N J. 11/21/2021 2:30 PM Medical Record Number: [id number] Patient Account Number: [id number] Date of Birth/Sex: Treating RN: [birth year] (66 y.o. F) Primary Care Provider: Harlan Houston Other Clinician: Referring Provider: Treating Provider/Extender: [name] Houston Weeks in Treatment: 4 Constitutional respirations regular, non-labored and within target range for patient.Marland Kitchen Psychiatric pleasant and cooperative. Notes Left lower extremity: T the posterior aspect there is an open wound with necrotic debris and nonviable tissue throughout the entire surface. Pain on palpation. o No increased warmth, erythema or purulent drainage noted. Electronic Signature(s) Signed: 11/21/2021 3:08:01 PM By: [name] Shan DO Entered By: [name] Houston on 11/21/2021 15:02:49 -------------------------------------------------------------------------------- Physician Orders Details Patient Name: Date of Service: [name] Cavalier RO N J. 11/21/2021 2:30 PM Medical Record Number: [id number] Patient Account Number: [id number] Date of Birth/Sex: Treating RN: October 31, 1955 (66 y.o. [name] Houston Primary Care Provider: Harlan Houston Other Clinician: Referring Provider: Treating Provider/Extender: [name] Houston in Treatment: 4 Verbal /  Phone Orders: No Diagnosis Coding Follow-up Appointments ppointment in 2 weeks. - Dr. Heber Houston and [name] Anna, RN (Room 7) 245pm 12/05/2021 Return A Other: - Stress test Friday 11/25/2021 Vein and Vascular to schedule procedure once stress test done and approves procedure. Bathing/ Shower/ Hygiene May shower and wash wound with soap and water. - can use antibacterial soap Edema Control - Lymphedema / SCD / Other Elevate  legs to the level of the heart or above for 30 minutes daily and/or when sitting, a frequency of: - throughout the day Avoid standing for long periods of time. Additional Orders / Instructions Follow Nutritious Diet - Monitor/Control Blood Sugars Wound Treatment Wound #1 - Lower Leg Wound Laterality: Left, Posterior Cleanser: Soap and Water 1 x Per Day/30 Days Discharge Instructions: May shower and wash wound with dial antibacterial soap and water prior to dressing change. Cleanser: Wound Cleanser 1 x Per Day/30 Days Discharge Instructions: Cleanse the wound with wound cleanser prior to applying a clean dressing using gauze sponges, not tissue or cotton balls. Prim Dressing: MediHoney Gel, tube 1.5 (oz) 1 x Per Day/30 Days ary Discharge Instructions: Apply to wound bed as instructed Secondary Dressing: T Non-Adherent Dressing, 3x4 in 1 x Per Day/30 Days elfa Discharge Instructions: Apply over primary dressing as directed. Secured With: 53M Medipore H Soft Cloth Surgical T ape, 4 x 10 (in/yd) 1 x Per Day/30 Days Discharge Instructions: Secure with tape as directed. Electronic Signature(s) Signed: 11/21/2021 3:08:01 PM By: [name] Shan DO Entered By: [name] Houston on 11/21/2021 15:02:56 -------------------------------------------------------------------------------- Problem List Details Patient Name: Date of Service: [name] Cavalier RO N J. 11/21/2021 2:30 PM Medical Record Number: [id number] Patient Account Number: [id number] Date of Birth/Sex: Treating RN: 1955/06/17 (66  y.o. F) Primary Care Provider: Harlan Houston Other Clinician: Referring Provider: Treating Provider/Extender: [name] Houston in Treatment: 4 Active Problems ICD-10 Encounter Code Description Active Date MDM Diagnosis I70.248 Atherosclerosis of native arteries of left leg with ulceration of other part of 10/20/2021 No Yes lower leg L97.828 Non-pressure chronic ulcer of other part of left lower leg with other specified 10/20/2021 No Yes severity E11.622 Type 2 diabetes mellitus with other skin ulcer 10/20/2021 No Yes Inactive Problems Resolved Problems Electronic Signature(s) Signed: 11/21/2021 3:08:01 PM By: [name] Shan DO Entered By: [name] Houston on 11/21/2021 14:59:14 -------------------------------------------------------------------------------- Progress Note Details Patient Name: Date of Service: [name] Cavalier RO N J. 11/21/2021 2:30 PM Medical Record Number: [id number] Patient Account Number: [id number] Date of Birth/Sex: Treating RN: 01/19/1956 (66 y.o. F) Primary Care Provider: Harlan Houston Other Clinician: Referring Provider: Treating Provider/Extender: [name] Houston in Treatment: 4 Subjective Chief Complaint Information obtained from Patient 10/20/2021; Left posterior leg wound History of Present Illness (HPI) Admission 06/26/2021 [name] Houston is a 66 year old female with a past medical history of uncontrolled type 2 diabetes on oral agents with last hemoglobin A1c of 9, peripheral arterial disease that presents to the clinic for a 3-week history of worsening wound to the posterior aspect of her left leg. She is not sure how it started. She has been using normal saline to the wound bed. She had arterial segmental pressures that showed a left ABI of 0.52 she is scheduled to see vein and vascular on 7/5. She is currently on doxycycline prescribed by her primary care doctor. She currently denies systemic  signs of infection. 7/7; patient presents for follow-up. She saw Dr. Donzetta Houston yesterday, vein and vascular to discuss her arterial status. He is scheduling her for an arteriogram for possible intervention. She has had no issues with the Medihoney. 7/24; patient presents for follow-up. She continues to use Medihoney. She is scheduled to have a stress test on 7/28 in order to be cleared for vascular surgery. Plan is for left common femoral endarterectomy and left common femoral to below the knee popliteal artery bypass. Patient History Information obtained from Patient, Caregiver. Family History Cancer - Father,  Diabetes - Mother,Maternal Grandparents,Father, Hypertension - Mother, No family history of Heart Disease, Hereditary Spherocytosis, Kidney Disease, Lung Disease, Seizures, Stroke, Thyroid Problems, Tuberculosis. Social History Former smoker, Marital Status - Married, Alcohol Use - Rarely, Drug Use - No History, Caffeine Use - Daily. Medical History Eyes Denies history of Cataracts, Glaucoma, Optic Neuritis Cardiovascular Patient has history of Hypertension, Peripheral Arterial Disease, Peripheral Venous Disease Denies history of Angina, Arrhythmia, Coronary Artery Disease, Deep Vein Thrombosis, Hypotension, Myocardial Infarction, Phlebitis, Vasculitis Gastrointestinal Denies history of Cirrhosis , Colitis, Crohnoos, Hepatitis A, Hepatitis B, Hepatitis C Endocrine Patient has history of Type II Diabetes - A1C-9.1 Denies history of Type I Diabetes Immunological Denies history of Lupus Erythematosus, Raynaudoos, Scleroderma Musculoskeletal Patient has history of Osteoarthritis, Osteomyelitis - Hx ankle Denies history of Gout, Rheumatoid Arthritis Neurologic Denies history of Neuropathy, Quadriplegia, Paraplegia, Seizure Disorder Hospitalization/Surgery History - left knee. Medical A Surgical History Notes nd Genitourinary CKD-3 Objective Constitutional respirations regular,  non-labored and within target range for patient.. Vitals Time Taken: 2:21 PM, Height: 64 in, Weight: 204 lbs, BMI: 35, Temperature: 97.6 F, Pulse: 62 bpm, Respiratory Rate: 20 breaths/min, Blood Pressure: 152/76 mmHg, Capillary Blood Glucose: 103 mg/dl. Psychiatric pleasant and cooperative. General Notes: Left lower extremity: T the posterior aspect there is an open wound with necrotic debris and nonviable tissue throughout the entire surface. o Pain on palpation. No increased warmth, erythema or purulent drainage noted. Integumentary (Hair, Skin) Wound #1 status is Open. Original cause of wound was Gradually Appeared. The date acquired was: 09/29/2021. The wound has been in treatment 4 weeks. The wound is located on the Left,Posterior Lower Leg. The wound measures 4.4cm length x 4.3cm width x 0.1cm depth; 14.86cm^2 area and 1.486cm^3 volume. There is Fat Layer (Subcutaneous Tissue) exposed. There is no tunneling noted. There is a medium amount of serosanguineous drainage noted. The wound margin is distinct with the outline attached to the wound base. There is no granulation within the wound bed. There is a large (67-100%) amount of necrotic tissue within the wound bed including Eschar and Adherent Slough. Assessment Active Problems ICD-10 Atherosclerosis of native arteries of left leg with ulceration of other part of lower leg Non-pressure chronic ulcer of other part of left lower leg with other specified severity Type 2 diabetes mellitus with other skin ulcer Patient's wound is slightly larger. No signs of surrounding infection. I recommended continuing Medihoney. She is getting cardiac clearance prior to her vascular surgery. Plan is for Dr. Donzetta Houston to do a left common femoral endarterectomy and left common femoral to below the knee popliteal artery bypass. She is following with Dr. Radford Pax for her preoperative clearance. Follow-up in 2 weeks. Plan Follow-up Appointments: Return Appointment  in 2 weeks. - Dr. Heber [place] and [name] Anna, RN (Room 7) 245pm 12/05/2021 Other: - Stress test Friday 11/25/2021 Vein and Vascular to schedule procedure once stress test done and approves procedure. Bathing/ Shower/ Hygiene: May shower and wash wound with soap and water. - can use antibacterial soap Edema Control - Lymphedema / SCD / Other: Elevate legs to the level of the heart or above for 30 minutes daily and/or when sitting, a frequency of: - throughout the day Avoid standing for long periods of time. Additional Orders / Instructions: Follow Nutritious Diet - Monitor/Control Blood Sugars WOUND #1: - Lower Leg Wound Laterality: Left, Posterior Cleanser: Soap and Water 1 x Per Day/30 Days Discharge Instructions: May shower and wash wound with dial antibacterial soap and water prior to dressing change. Cleanser: Wound  Cleanser 1 x Per Day/30 Days Discharge Instructions: Cleanse the wound with wound cleanser prior to applying a clean dressing using gauze sponges, not tissue or cotton balls. Prim Dressing: MediHoney Gel, tube 1.5 (oz) 1 x Per Day/30 Days ary Discharge Instructions: Apply to wound bed as instructed Secondary Dressing: T Non-Adherent Dressing, 3x4 in 1 x Per Day/30 Days elfa Discharge Instructions: Apply over primary dressing as directed. Secured With: 48M Medipore H Soft Cloth Surgical T ape, 4 x 10 (in/yd) 1 x Per Day/30 Days Discharge Instructions: Secure with tape as directed. 1. Medihoney 2. Follow-up in 2 weeks Electronic Signature(s) Signed: 11/21/2021 3:08:01 PM By: [name] Shan DO Entered By: [name] Houston on 11/21/2021 15:04:30 -------------------------------------------------------------------------------- HxROS Details Patient Name: Date of Service: [name] Cavalier RO N J. 11/21/2021 2:30 PM Medical Record Number: [id number] Patient Account Number: [id number] Date of Birth/Sex: Treating RN: 1955-06-11 (66 y.o. F) Primary Care Provider: Harlan Houston Other  Clinician: Referring Provider: Treating Provider/Extender: [name] Houston in Treatment: 4 Information Obtained From Patient Caregiver Eyes Medical History: Negative for: Cataracts; Glaucoma; Optic Neuritis Cardiovascular Medical History: Positive for: Hypertension; Peripheral Arterial Disease; Peripheral Venous Disease Negative for: Angina; Arrhythmia; Coronary Artery Disease; Deep Vein Thrombosis; Hypotension; Myocardial Infarction; Phlebitis; Vasculitis Gastrointestinal Medical History: Negative for: Cirrhosis ; Colitis; Crohns; Hepatitis A; Hepatitis B; Hepatitis C Endocrine Medical History: Positive for: Type II Diabetes - A1C-9.1 Negative for: Type I Diabetes Time with diabetes: 12 years Treated with: Oral agents Blood sugar tested every day: No Genitourinary Medical History: Past Medical History Notes: CKD-3 Immunological Medical History: Negative for: Lupus Erythematosus; Raynauds; Scleroderma Musculoskeletal Medical History: Positive for: Osteoarthritis; Osteomyelitis - Hx ankle Negative for: Gout; Rheumatoid Arthritis Neurologic Medical History: Negative for: Neuropathy; Quadriplegia; Paraplegia; Seizure Disorder Immunizations Pneumococcal Vaccine: Received Pneumococcal Vaccination: No Implantable Devices None Hospitalization / Surgery History Type of Hospitalization/Surgery left knee Family and Social History Cancer: Yes - Father; Diabetes: Yes - Mother,Maternal Grandparents,Father; Heart Disease: No; Hereditary Spherocytosis: No; Hypertension: Yes - Mother; Kidney Disease: No; Lung Disease: No; Seizures: No; Stroke: No; Thyroid Problems: No; Tuberculosis: No; Former smoker; Marital Status - Married; Alcohol Use: Rarely; Drug Use: No History; Caffeine Use: Daily; Financial Concerns: No; Food, Clothing or Shelter Needs: No; Support System Lacking: No; Transportation Concerns: No Electronic Signature(s) Signed: 11/21/2021 3:08:01 PM  By: [name] Shan DO Entered By: [name] Houston on 11/21/2021 15:02:30 -------------------------------------------------------------------------------- SuperBill Details Patient Name: Date of Service: [name] Kays. 11/21/2021 Medical Record Number: [id number] Patient Account Number: [id number] Date of Birth/Sex: Treating RN: 11-23-1955 (66 y.o. Helene Shoe, Meta.Reding Primary Care Provider: Harlan Houston Other Clinician: Referring Provider: Treating Provider/Extender: [name] Houston Weeks in Treatment: 4 Diagnosis Coding ICD-10 Codes Code Description [phone] Atherosclerosis of native arteries of left leg with ulceration of other part of lower leg L97.828 Non-pressure chronic ulcer of other part of left lower leg with other specified severity E11.622 Type 2 diabetes mellitus with other skin ulcer Facility Procedures CPT4 Code: [id number] Description: 99213 - WOUND CARE VISIT-LEV 3 EST PT Modifier: Quantity: 1 Physician Procedures : CPT4 Code Description Modifier [id number] - WC PHYS LEVEL 3 - EST PT ICD-10 Diagnosis Description I70.248 Atherosclerosis of native arteries of left leg with ulceration of other part of lower leg L97.828 Non-pressure chronic ulcer of other part of  left lower leg with other specified severity E11.622 Type 2 diabetes mellitus with other skin ulcer Quantity: 1 Electronic Signature(s) Signed: 11/21/2021 3:08:01 PM By: [name] Shan DO Entered By: [name] Houston  on 11/21/2021 15:04:44

## 2021-11-21 NOTE — Progress Notes (Addendum)
[name] Houston ([id number]) Visit Report for 11/21/2021 Arrival Information Details Patient Name: Date of Service: [name] Houston, [name] Houston 11/21/2021 2:30 PM Medical Record Number: [id number] Patient Account Number: [id number] Date of Birth/Sex: Treating RN: 06-09-1955 (66 y.o. Helene Shoe, Meta.Reding Primary Care Insiya Oshea: Harlan Stains Other Clinician: Referring Rasa Degrazia: Treating Linsie Lupo/Extender: Jake Church in Treatment: 4 Visit Information History Since Last Visit All ordered tests and consults were completed: Yes Patient Arrived: [name] Houston Added or deleted any medications: No Arrival Time: 14:24 Any new allergies or adverse reactions: No Accompanied By: husband Had a fall or experienced change in No Transfer Assistance: None activities of daily living that may affect Patient Identification Verified: Yes risk of falls: Secondary Verification Process Completed: Yes Signs or symptoms of abuse/neglect since last visito No Patient Requires Transmission-Based Precautions: No Hospitalized since last visit: No Patient Has Alerts: Yes Implantable device outside of the clinic excluding No Patient Alerts: Patient on Blood Thinner cellular tissue based products placed in the center 10/11/21 ABI R=.69 L=.52 since last visit: Has Dressing in Place as Prescribed: Yes Pain Present Now: Yes Notes per patient having a stress test done this Friday 11/25/2021. Per patient has to be complete before the procedure [facility] want to perform on her arteries. Trejuan Matherne made aware. Electronic Signature(s) Signed: 11/21/2021 4:56:39 PM By: Deon Pilling RN, BSN Entered By: Deon Pilling on 11/21/2021 14:27:52 -------------------------------------------------------------------------------- Clinic Level of Care Assessment Details Patient Name: Date of Service: [name] Houston 11/21/2021 2:30 PM Medical Record Number: [id number] Patient Account Number: [id number] Date of  Birth/Sex: Treating RN: Jul 20, 1955 (66 y.o. Helene Shoe, Meta.Reding Primary Care Yvette Loveless: Harlan Stains Other Clinician: Referring Caliph Borowiak: Treating Lauralyn Shadowens/Extender: Jake Church in Treatment: 4 Clinic Level of Care Assessment Items TOOL 4 Quantity Score X- 1 0 Use when only an EandM is performed on FOLLOW-UP visit ASSESSMENTS - Nursing Assessment / Reassessment X- 1 10 Reassessment of Co-morbidities (includes updates in patient status) X- 1 5 Reassessment of Adherence to Treatment Plan ASSESSMENTS - Wound and Skin A ssessment / Reassessment X - Simple Wound Assessment / Reassessment - one wound 1 5 '[]'$  - 0 Complex Wound Assessment / Reassessment - multiple wounds X- 1 10 Dermatologic / Skin Assessment (not related to wound area) ASSESSMENTS - Focused Assessment X- 1 5 Circumferential Edema Measurements - multi extremities '[]'$  - 0 Nutritional Assessment / Counseling / Intervention '[]'$  - 0 Lower Extremity Assessment (monofilament, tuning fork, pulses) '[]'$  - 0 Peripheral Arterial Disease Assessment (using hand held doppler) ASSESSMENTS - Ostomy and/or Continence Assessment and Care '[]'$  - 0 Incontinence Assessment and Management '[]'$  - 0 Ostomy Care Assessment and Management (repouching, etc.) PROCESS - Coordination of Care X - Simple Patient / Family Education for ongoing care 1 15 '[]'$  - 0 Complex (extensive) Patient / Family Education for ongoing care X- 1 10 Staff obtains Programmer, systems, Records, T Results / Process Orders est '[]'$  - 0 Staff telephones HHA, Nursing Homes / Clarify orders / etc '[]'$  - 0 Routine Transfer to another Facility (non-emergent condition) '[]'$  - 0 Routine Hospital Admission (non-emergent condition) '[]'$  - 0 New Admissions / Biomedical engineer / Ordering NPWT Apligraf, etc. , '[]'$  - 0 Emergency Hospital Admission (emergent condition) X- 1 10 Simple Discharge Coordination '[]'$  - 0 Complex (extensive) Discharge Coordination PROCESS -  Special Needs '[]'$  - 0 Pediatric / Minor Patient Management '[]'$  - 0 Isolation Patient Management '[]'$  - 0 Hearing / Language / Visual special needs '[]'$  -  0 Assessment of Community assistance (transportation, D/C planning, etc.) '[]'$  - 0 Additional assistance / Altered mentation '[]'$  - 0 Support Surface(s) Assessment (bed, cushion, seat, etc.) INTERVENTIONS - Wound Cleansing / Measurement X - Simple Wound Cleansing - one wound 1 5 '[]'$  - 0 Complex Wound Cleansing - multiple wounds X- 1 5 Wound Imaging (photographs - any number of wounds) '[]'$  - 0 Wound Tracing (instead of photographs) X- 1 5 Simple Wound Measurement - one wound '[]'$  - 0 Complex Wound Measurement - multiple wounds INTERVENTIONS - Wound Dressings X - Small Wound Dressing one or multiple wounds 1 10 '[]'$  - 0 Medium Wound Dressing one or multiple wounds '[]'$  - 0 Large Wound Dressing one or multiple wounds X- 1 5 Application of Medications - topical '[]'$  - 0 Application of Medications - injection INTERVENTIONS - Miscellaneous '[]'$  - 0 External ear exam '[]'$  - 0 Specimen Collection (cultures, biopsies, blood, body fluids, etc.) '[]'$  - 0 Specimen(s) / Culture(s) sent or taken to Lab for analysis '[]'$  - 0 Patient Transfer (multiple staff / Civil Service fast streamer / Similar devices) '[]'$  - 0 Simple Staple / Suture removal (25 or less) '[]'$  - 0 Complex Staple / Suture removal (26 or more) '[]'$  - 0 Hypo / Hyperglycemic Management (close monitor of Blood Glucose) '[]'$  - 0 Ankle / Brachial Index (ABI) - do not check if billed separately X- 1 5 Vital Signs Has the patient been seen at the hospital within the last three years: Yes Total Score: 105 Level Of Care: New/Established - Level 3 Electronic Signature(s) Signed: 11/21/2021 4:56:39 PM By: Deon Pilling RN, BSN Entered By: Deon Pilling on 11/21/2021 14:48:35 -------------------------------------------------------------------------------- Encounter Discharge Information Details Patient Name: Date of  Service: [name] Houston RO N J. 11/21/2021 2:30 PM Medical Record Number: [id number] Patient Account Number: [id number] Date of Birth/Sex: Treating RN: [birth year] (65 y.o. [name] Primary Care Broadus Costilla: Harlan Stains Other Clinician: Referring Landis Dowdy: Treating Brody Bonneau/Extender: Jake Church in Treatment: 4 Encounter Discharge Information Items Discharge Condition: Stable Ambulatory Status: Cane Discharge Destination: Home Transportation: Private Auto Accompanied By: husband Schedule Follow-up Appointment: Yes Clinical Summary of Care: Electronic Signature(s) Signed: 11/21/2021 4:56:39 PM By: Deon Pilling RN, BSN Entered By: Deon Pilling on 11/21/2021 14:48:58 -------------------------------------------------------------------------------- Lower Extremity Assessment Details Patient Name: Date of Service: [name] Houston RO N J. 11/21/2021 2:30 PM Medical Record Number: [id number] Patient Account Number: [id number] Date of Birth/Sex: Treating RN: January 19, 1956 (66 y.o. [name] Primary Care Tyanna Hach: Harlan Stains Other Clinician: Referring Myka Hitz: Treating Mushka Laconte/Extender: [name] Weeks in Treatment: 4 Edema Assessment Assessed: [Left: Yes] [Right: No] Edema: [Left: Ye] [Right: s] Calf Left: Right: Point of Measurement: 36 cm From Medial Instep 38 cm Ankle Left: Right: Point of Measurement: 10 cm From Medial Instep 22 cm Vascular Assessment Pulses: Dorsalis Pedis Palpable: [Left:No] Electronic Signature(s) Signed: 11/21/2021 4:56:39 PM By: Deon Pilling RN, BSN Entered By: Deon Pilling on 11/21/2021 14:24:39 -------------------------------------------------------------------------------- Multi Wound Chart Details Patient Name: Date of Service: [name] Houston RO N J. 11/21/2021 2:30 PM Medical Record Number: [id number] Patient Account Number: [id number] Date of Birth/Sex: Treating RN: 08/29/1955 (66  y.o. F) Primary Care Tymeshia Awan: Harlan Stains Other Clinician: Referring Minnetta Sandora: Treating Faiz Weber/Extender: [name] Weeks in Treatment: 4 Vital Signs Height(in): 64 Capillary Blood Glucose(mg/dl): 103 Weight(lbs): 204 Pulse(bpm): 32 Body Mass Index(BMI): 35 Blood Pressure(mmHg): 152/76 Temperature(F): 97.6 Respiratory Rate(breaths/min): 20 Photos: [N/A:N/A] Left, Posterior Lower Leg N/A N/A Wound Location: Gradually Appeared N/A N/A Wounding Event: Arterial Insufficiency  Ulcer N/A N/A Primary Etiology: Hypertension, Peripheral Arterial N/A N/A Comorbid History: Disease, Peripheral Venous Disease, Type II Diabetes, Osteoarthritis, Osteomyelitis 09/29/2021 N/A N/A Date Acquired: 4 N/A N/A Weeks of Treatment: Open N/A N/A Wound Status: No N/A N/A Wound Recurrence: 4.4x4.3x0.1 N/A N/A Measurements L x W x D (cm) 14.86 N/A N/A A (cm) : rea 1.486 N/A N/A Volume (cm) : -70.50% N/A N/A % Reduction in Area: -70.40% N/A N/A % Reduction in Volume: Full Thickness Without Exposed N/A N/A Classification: Support Structures Medium N/A N/A Exudate A mount: Serosanguineous N/A N/A Exudate Type: red, brown N/A N/A Exudate Color: Distinct, outline attached N/A N/A Wound Margin: None Present (0%) N/A N/A Granulation Amount: Large (67-100%) N/A N/A Necrotic Amount: Eschar, Adherent Slough N/A N/A Necrotic Tissue: Fat Layer (Subcutaneous Tissue): Yes N/A N/A Exposed Structures: Fascia: No Tendon: No Muscle: No Joint: No Bone: No None N/A N/A Epithelialization: Treatment Notes Wound #1 (Lower Leg) Wound Laterality: Left, Posterior Cleanser Soap and Water Discharge Instruction: May shower and wash wound with dial antibacterial soap and water prior to dressing change. Wound Cleanser Discharge Instruction: Cleanse the wound with wound cleanser prior to applying a clean dressing using gauze sponges, not tissue or cotton  balls. Peri-Wound Care Topical Primary Dressing MediHoney Gel, tube 1.5 (oz) Discharge Instruction: Apply to wound bed as instructed Secondary Dressing T Non-Adherent Dressing, 3x4 in elfa Discharge Instruction: Apply over primary dressing as directed. Secured With 51M Medipore H Soft Cloth Surgical T ape, 4 x 10 (in/yd) Discharge Instruction: Secure with tape as directed. Compression Wrap Compression Stockings Add-Ons Electronic Signature(s) Signed: 11/21/2021 3:08:01 PM By: Kalman Shan DO Entered By: Kalman Shan on 11/21/2021 14:59:28 -------------------------------------------------------------------------------- Multi-Disciplinary Care Plan Details Patient Name: Date of Service: [name] Houston RO N J. 11/21/2021 2:30 PM Medical Record Number: [id number] Patient Account Number: [id number] Date of Birth/Sex: Treating RN: 1955-10-07 (66 y.o. [name] Primary Care Aundrea Higginbotham: Harlan Stains Other Clinician: Referring Cecilie Heidel: Treating Lianna Sitzmann/Extender: [name] Weeks in Treatment: 4 Active Inactive Electronic Signature(s) Signed: 12/16/2021 4:38:59 PM By: Deon Pilling RN, BSN Previous Signature: 11/21/2021 4:56:39 PM Version By: Deon Pilling RN, BSN Entered By: Deon Pilling on 12/16/2021 16:38:59 -------------------------------------------------------------------------------- Pain Assessment Details Patient Name: Date of Service: [name] Houston RO N J. 11/21/2021 2:30 PM Medical Record Number: [id number] Patient Account Number: [id number] Date of Birth/Sex: Treating RN: 1956/03/05 (66 y.o. [name] Primary Care Nasteho Glantz: Harlan Stains Other Clinician: Referring Ahaan Zobrist: Treating Jonh Mcqueary/Extender: [name] Weeks in Treatment: 4 Active Problems Location of Pain Severity and Description of Pain Patient Has Paino Yes Site Locations Pain Location: Pain in Ulcers Rate the pain. Current Pain Level:  7 Worst Pain Level: 10 Least Pain Level: 0 Tolerable Pain Level: 8 Character of Pain Describe the Pain: Aching, Burning, Easy to Pinpoint, Heavy, Sharp Pain Management and Medication Current Pain Management: Medication: No Cold Application: No Rest: No Massage: No Activity: No T.E.N.S.: No Heat Application: No Leg drop or elevation: No Is the Current Pain Management Adequate: Adequate How does your wound impact your activities of daily livingo Sleep: No Bathing: No Appetite: No Relationship With Others: No Bladder Continence: No Emotions: No Bowel Continence: No Work: No Toileting: No Drive: No Dressing: No Hobbies: No Electronic Signature(s) Signed: 11/21/2021 4:56:39 PM By: Deon Pilling RN, BSN Entered By: Deon Pilling on 11/21/2021 14:28:42 -------------------------------------------------------------------------------- Patient/Caregiver Education Details Patient Name: Date of Service: [name] 7/24/2023andnbsp2:30 PM Medical Record Number: [id number] Patient Account Number: [id number] Date  of Birth/Gender: Treating RN: 1956/04/01 (66 y.o. [name] Primary Care Physician: Harlan Stains Other Clinician: Referring Physician: Treating Physician/Extender: Jake Church in Treatment: 4 Education Assessment Education Provided To: Patient Education Topics Provided Elevated Blood Sugar/ Impact on Healing: Handouts: Elevated Blood Sugars: How Do They Affect Wound Healing Methods: Explain/Verbal Responses: Reinforcements needed Electronic Signature(s) Signed: 11/21/2021 4:56:39 PM By: Deon Pilling RN, BSN Entered By: Deon Pilling on 11/21/2021 14:44:44 -------------------------------------------------------------------------------- Wound Assessment Details Patient Name: Date of Service: [name] Houston RO N J. 11/21/2021 2:30 PM Medical Record Number: [id number] Patient Account Number: [id number] Date of  Birth/Sex: Treating RN: December 10, 1955 (66 y.o. Helene Shoe, Meta.Reding Primary Care Tanner Vigna: Harlan Stains Other Clinician: Referring Channelle Bottger: Treating Dossie Ocanas/Extender: [name] Weeks in Treatment: 4 Wound Status Wound Number: 1 Primary Arterial Insufficiency Ulcer Etiology: Wound Location: Left, Posterior Lower Leg Wound Open Wounding Event: Gradually Appeared Status: Date Acquired: 09/29/2021 Comorbid Hypertension, Peripheral Arterial Disease, Peripheral Venous Weeks Of Treatment: 4 History: Disease, Type II Diabetes, Osteoarthritis, Osteomyelitis Clustered Wound: No Photos Wound Measurements Length: (cm) 4.4 Width: (cm) 4.3 Depth: (cm) 0.1 Area: (cm) 14.86 Volume: (cm) 1.486 % Reduction in Area: -70.5% % Reduction in Volume: -70.4% Epithelialization: None Tunneling: No Wound Description Classification: Full Thickness Without Exposed Support Structures Wound Margin: Distinct, outline attached Exudate Amount: Medium Exudate Type: Serosanguineous Exudate Color: red, brown Foul Odor After Cleansing: No Slough/Fibrino Yes Wound Bed Granulation Amount: None Present (0%) Exposed Structure Necrotic Amount: Large (67-100%) Fascia Exposed: No Necrotic Quality: Eschar, Adherent Slough Fat Layer (Subcutaneous Tissue) Exposed: Yes Tendon Exposed: No Muscle Exposed: No Joint Exposed: No Bone Exposed: No Electronic Signature(s) Signed: 11/21/2021 4:56:39 PM By: Deon Pilling RN, BSN Entered By: Deon Pilling on 11/21/2021 14:44:37 -------------------------------------------------------------------------------- Vitals Details Patient Name: Date of Service: [name] Houston RO N J. 11/21/2021 2:30 PM Medical Record Number: [id number] Patient Account Number: [id number] Date of Birth/Sex: Treating RN: 07-30-1955 (66 y.o. Helene Shoe, Meta.Reding Primary Care Dillon Mcreynolds: Harlan Stains Other Clinician: Referring Deion Swift: Treating Kery Haltiwanger/Extender: [name] Weeks in Treatment: 4 Vital Signs Time Taken: 14:21 Temperature (F): 97.6 Height (in): 64 Pulse (bpm): 62 Weight (lbs): 204 Respiratory Rate (breaths/min): 20 Body Mass Index (BMI): 35 Blood Pressure (mmHg): 152/76 Capillary Blood Glucose (mg/dl): 103 Reference Range: 80 - 120 mg / dl Electronic Signature(s) Signed: 11/21/2021 4:56:39 PM By: Deon Pilling RN, BSN Entered By: Deon Pilling on 11/21/2021 14:28:25

## 2021-11-23 ENCOUNTER — Other Ambulatory Visit: Payer: Self-pay

## 2021-11-23 DIAGNOSIS — I70262 Atherosclerosis of native arteries of extremities with gangrene, left leg: Secondary | ICD-10-CM

## 2021-11-25 ENCOUNTER — Other Ambulatory Visit (HOSPITAL_BASED_OUTPATIENT_CLINIC_OR_DEPARTMENT_OTHER): Payer: PPO

## 2021-12-01 NOTE — Pre-Procedure Instructions (Signed)
Surgical Instructions    Your procedure is scheduled on Tuesday, August 8.  Report to [facility] Main Entrance "A" at 5:30 A.M., then check in with the Admitting office.  Call this number if you have problems the morning of surgery:  [phone]   If you have any questions prior to your surgery date call [phone]: Open Monday-Friday 8am-4pm    Remember:  Do not eat or drink after midnight the night before your surgery    Take these medicines the morning of surgery with A SIP OF WATER:  amLODipine (NORVASC) atorvastatin (LIPITOR)  famotidine (PEPCID)  if needed acetaminophen (TYLENOL)  if needed HYDROcodone-acetaminophen (NORCO/VICODIN) if needed  As of today, STOP taking any Aleve, Naproxen, Ibuprofen, Motrin, Advil, Goody's, BC's, all herbal medications, fish oil, and all vitamins.  Follow your surgeon's instructions on when to stop Aspirin.  If no instructions were given by your surgeon then you will need to call the office to get those instructions.    WHAT DO I DO ABOUT MY DIABETES MEDICATION?  -DO NOT take dapagliflozin propanediol (FARXIGA) for 72 hours prior to surgery.  -DO NOT take glipiZIDE (GLUCOTROL) the night before or morning of surgery.  -DO NOT take Semaglutide (Ozempic) the day of surgery.  -DO NOT take Pioglitazone (Actos) the day of surgery.    HOW TO MANAGE YOUR DIABETES BEFORE AND AFTER SURGERY  Why is it important to control my blood sugar before and after surgery? Improving blood sugar levels before and after surgery helps healing and can limit problems. A way of improving blood sugar control is eating a healthy diet by:  Eating less sugar and carbohydrates  Increasing activity/exercise  Talking with your doctor about reaching your blood sugar goals High blood sugars (greater than 180 mg/dL) can raise your risk of infections and slow your recovery, so you will need to focus on controlling your diabetes during the weeks before surgery. Make sure  that the doctor who takes care of your diabetes knows about your planned surgery including the date and location.  How do I manage my blood sugar before surgery? Check your blood sugar at least 4 times a day, starting 2 days before surgery, to make sure that the level is not too high or low.  Check your blood sugar the morning of your surgery when you wake up and every 2 hours until you get to the Short Stay unit.  If your blood sugar is less than 70 mg/dL, you will need to treat for low blood sugar: Do not take insulin. Treat a low blood sugar (less than 70 mg/dL) with  cup of clear juice (cranberry or apple), 4 glucose tablets, OR glucose gel. Recheck blood sugar in 15 minutes after treatment (to make sure it is greater than 70 mg/dL). If your blood sugar is not greater than 70 mg/dL on recheck, call [phone] for further instructions. Report your blood sugar to the short stay nurse when you get to Short Stay.  If you are admitted to the hospital after surgery: Your blood sugar will be checked by the staff and you will probably be given insulin after surgery (instead of oral diabetes medicines) to make sure you have good blood sugar levels. The goal for blood sugar control after surgery is 80-180 mg/dL.   [facility] is not responsible for any belongings or valuables.    Do NOT Smoke (Tobacco/Vaping)  24 hours prior to your procedure  If you use a CPAP at night, you may bring  your mask for your overnight stay.   Contacts, glasses, hearing aids, dentures or partials may not be worn into surgery, please bring cases for these belongings   For patients admitted to the hospital, discharge time will be determined by your treatment team.   Patients discharged the day of surgery will not be allowed to drive home, and someone needs to stay with them for 24 hours.   SURGICAL WAITING ROOM VISITATION Patients having surgery or a procedure may have no more than 2 support people in the  waiting area - these visitors may rotate.   Children under the age of 77 must have an adult with them who is not the patient. If the patient needs to stay at the hospital during part of their recovery, the visitor guidelines for inpatient rooms apply. Pre-op nurse will coordinate an appropriate time for 1 support person to accompany patient in pre-op.  This support person may not rotate.   Please refer to the [facility] website for the visitor guidelines for Inpatients (after your surgery is over and you are in a regular room).    Special instructions:    Oral Hygiene is also important to reduce your risk of infection.  Remember - BRUSH YOUR TEETH THE MORNING OF SURGERY WITH YOUR REGULAR TOOTHPASTE   [facility]- Preparing For Surgery  Before surgery, you can play an important role. Because skin is not sterile, your skin needs to be as free of germs as possible. You can reduce the number of germs on your skin by washing with CHG (chlorahexidine gluconate) Soap before surgery.  CHG is an antiseptic cleaner which kills germs and bonds with the skin to continue killing germs even after washing.     Please do not use if you have an allergy to CHG or antibacterial soaps. If your skin becomes reddened/irritated stop using the CHG.  Do not shave (including legs and underarms) for at least 48 hours prior to first CHG shower. It is OK to shave your face.  Please follow these instructions carefully.     Shower the NIGHT BEFORE SURGERY and the MORNING OF SURGERY with CHG Soap.   If you chose to wash your hair, wash your hair first as usual with your normal shampoo. After you shampoo, rinse your hair and body thoroughly to remove the shampoo.  Then [employer] and genitals (private parts) with your normal soap and rinse thoroughly to remove soap.  After that Use CHG Soap as you would any other liquid soap. You can apply CHG directly to the skin and wash gently with a scrungie or a clean washcloth.    Apply the CHG Soap to your body ONLY FROM THE NECK DOWN.  Do not use on open wounds or open sores. Avoid contact with your eyes, ears, mouth and genitals (private parts). Wash Face and genitals (private parts)  with your normal soap.   Wash thoroughly, paying special attention to the area where your surgery will be performed.  Thoroughly rinse your body with warm water from the neck down.  DO NOT shower/wash with your normal soap after using and rinsing off the CHG Soap.  Pat yourself dry with a CLEAN TOWEL.  Wear CLEAN PAJAMAS to bed the night before surgery  Place CLEAN SHEETS on your bed the night before your surgery  DO NOT SLEEP WITH PETS.   Day of Surgery:  Take a shower with CHG soap. Wear Clean/Comfortable clothing the morning of surgery Do not wear jewelry or  makeup. Do not wear lotions, powders, perfumes/cologne or deodorant. Do not shave 48 hours prior to surgery.  Men may shave face and neck. Do not bring valuables to the hospital. Do not wear nail polish, gel polish, artificial nails, or any other type of covering on natural nails (fingers and toes) If you have artificial nails or gel coating that need to be removed by a nail salon, please have this removed prior to surgery. Artificial nails or gel coating may interfere with anesthesia's ability to adequately monitor your vital signs. Remember to brush your teeth WITH YOUR REGULAR TOOTHPASTE.    If you received a COVID test during your pre-op visit, it is requested that you wear a mask when out in public, stay away from anyone that may not be feeling well, and notify your surgeon if you develop symptoms. If you have been in contact with anyone that has tested positive in the last 10 days, please notify your surgeon.    Please read over the following fact sheets that you were given.

## 2021-12-02 ENCOUNTER — Other Ambulatory Visit: Payer: Self-pay

## 2021-12-02 ENCOUNTER — Encounter (HOSPITAL_COMMUNITY): Payer: Self-pay

## 2021-12-02 ENCOUNTER — Encounter (HOSPITAL_COMMUNITY)
Admission: RE | Admit: 2021-12-02 | Discharge: 2021-12-02 | Disposition: A | Discharge status: Home / Self Care | Payer: HMO | Source: Ambulatory Visit | Attending: Vascular Surgery | Admitting: Vascular Surgery

## 2021-12-02 VITALS — BP 175/66 | HR 75 | Temp 97.9°F | Resp 17 | Ht 64.0 in | Wt 197.9 lb

## 2021-12-02 DIAGNOSIS — N183 Chronic kidney disease, stage 3 unspecified: Secondary | ICD-10-CM | POA: Diagnosis not present

## 2021-12-02 DIAGNOSIS — E1122 Type 2 diabetes mellitus with diabetic chronic kidney disease: Secondary | ICD-10-CM | POA: Insufficient documentation

## 2021-12-02 DIAGNOSIS — K219 Gastro-esophageal reflux disease without esophagitis: Secondary | ICD-10-CM | POA: Diagnosis not present

## 2021-12-02 DIAGNOSIS — I129 Hypertensive chronic kidney disease with stage 1 through stage 4 chronic kidney disease, or unspecified chronic kidney disease: Secondary | ICD-10-CM | POA: Diagnosis not present

## 2021-12-02 DIAGNOSIS — E785 Hyperlipidemia, unspecified: Secondary | ICD-10-CM | POA: Diagnosis not present

## 2021-12-02 DIAGNOSIS — Z7985 Long-term (current) use of injectable non-insulin antidiabetic drugs: Secondary | ICD-10-CM | POA: Diagnosis not present

## 2021-12-02 DIAGNOSIS — E1152 Type 2 diabetes mellitus with diabetic peripheral angiopathy with gangrene: Secondary | ICD-10-CM | POA: Diagnosis not present

## 2021-12-02 DIAGNOSIS — Z01812 Encounter for preprocedural laboratory examination: Secondary | ICD-10-CM | POA: Diagnosis not present

## 2021-12-02 DIAGNOSIS — Z7984 Long term (current) use of oral hypoglycemic drugs: Secondary | ICD-10-CM | POA: Insufficient documentation

## 2021-12-02 DIAGNOSIS — I70262 Atherosclerosis of native arteries of extremities with gangrene, left leg: Secondary | ICD-10-CM

## 2021-12-02 DIAGNOSIS — Z01818 Encounter for other preprocedural examination: Secondary | ICD-10-CM

## 2021-12-02 HISTORY — DX: Gastro-esophageal reflux disease without esophagitis: K21.9

## 2021-12-02 LAB — COMPREHENSIVE METABOLIC PANEL
ALT: 15 U/L (ref 0–44)
AST: 18 U/L (ref 15–41)
Albumin: 3.9 g/dL (ref 3.5–5.0)
Alkaline Phosphatase: 55 U/L (ref 38–126)
Anion gap: 11 (ref 5–15)
BUN: 28 mg/dL — ABNORMAL HIGH (ref 8–23)
CO2: 29 mmol/L (ref 22–32)
Calcium: 10 mg/dL (ref 8.9–10.3)
Chloride: 95 mmol/L — ABNORMAL LOW (ref 98–111)
Creatinine, Ser: 1.76 mg/dL — ABNORMAL HIGH (ref 0.44–1.00)
GFR, Estimated: 32 mL/min — ABNORMAL LOW (ref >60)
Glucose, Bld: 141 mg/dL — ABNORMAL HIGH (ref 70–99)
Potassium: 3.4 mmol/L — ABNORMAL LOW (ref 3.5–5.1)
Sodium: 135 mmol/L (ref 135–145)
Total Bilirubin: 0.5 mg/dL (ref 0.3–1.2)
Total Protein: 8.7 g/dL — ABNORMAL HIGH (ref 6.5–8.1)

## 2021-12-02 LAB — URINALYSIS, ROUTINE W REFLEX MICROSCOPIC
Bilirubin Urine: NEGATIVE
Glucose, UA: >=500 mg/dL — AB
Ketones, ur: NEGATIVE mg/dL
Leukocytes,Ua: NEGATIVE
Nitrite: NEGATIVE
Protein, ur: 100 mg/dL — AB
Specific Gravity, Urine: 1.025 (ref 1.005–1.030)
pH: 5 (ref 5.0–8.0)

## 2021-12-02 LAB — TYPE AND SCREEN
ABO/RH(D): O POS
Antibody Screen: NEGATIVE

## 2021-12-02 LAB — HEMOGLOBIN A1C
Hgb A1c MFr Bld: 8.6 % — ABNORMAL HIGH (ref 4.8–5.6)
Mean Plasma Glucose: 200.12 mg/dL

## 2021-12-02 LAB — PROTIME-INR
INR: 1.1 (ref 0.8–1.2)
Prothrombin Time: 14.4 seconds (ref 11.4–15.2)

## 2021-12-02 LAB — CBC
HCT: 41.3 % (ref 36.0–46.0)
Hemoglobin: 13.6 g/dL (ref 12.0–15.0)
MCH: 27.8 pg (ref 26.0–34.0)
MCHC: 32.9 g/dL (ref 30.0–36.0)
MCV: 84.5 fL (ref 80.0–100.0)
Platelets: 366 10*3/uL (ref 150–400)
RBC: 4.89 MIL/uL (ref 3.87–5.11)
RDW: 15.1 % (ref 11.5–15.5)
WBC: 7 10*3/uL (ref 4.0–10.5)
nRBC: 0 % (ref 0.0–0.2)

## 2021-12-02 LAB — SURGICAL PCR SCREEN
MRSA, PCR: NEGATIVE
Staphylococcus aureus: NEGATIVE

## 2021-12-02 LAB — GLUCOSE, CAPILLARY: Glucose-Capillary: 155 mg/dL — ABNORMAL HIGH (ref 70–99)

## 2021-12-02 LAB — APTT: aPTT: 31 seconds (ref 24–36)

## 2021-12-02 NOTE — Progress Notes (Signed)
PCP - Dr. Harlan Stains Cardiologist - Dr. Fransico Him  PPM/ICD - n/a  Chest x-ray - n/a EKG - 11/14/21 Stress Test - denies ECHO - denies Cardiac Cath - denies  Sleep Study - denies CPAP - denies  Fasting Blood Sugar - 120-140 Checks Blood Sugar 1 time a day. Every morning. CBG at PAT 155. Will obtained A1C.  Blood Thinner Instructions: N/a Aspirin Instructions:Continue per surgeon  NPO  COVID TEST- n/a  Anesthesia review: Yes, cardiac clearance received.   Patient denies shortness of breath, fever, cough and chest pain at PAT appointment   All instructions explained to the patient, with a verbal understanding of the material. Patient agrees to go over the instructions while at home for a better understanding. Patient also instructed to self quarantine after being tested for COVID-19. The opportunity to ask questions was provided.

## 2021-12-05 ENCOUNTER — Encounter (HOSPITAL_BASED_OUTPATIENT_CLINIC_OR_DEPARTMENT_OTHER): Payer: HMO | Admitting: Internal Medicine

## 2021-12-05 NOTE — Progress Notes (Signed)
Anesthesia Chart Review:  Case: [id number] Date/Time: 12/06/21 0715   Procedures:      LEFT COMMON FEMORAL ENDARTERECTOMY (Left)     LEFT COMMON FEMORAL-BELOW KNEE POPLITEAL ARTERY BYPASS (Left)   Anesthesia type: General   Pre-op diagnosis: Critical limb ischemia of left lower extremity with gangrene   Location: [facility] OR ROOM 11 / [place] [facility]   Surgeons: Waynetta Sandy, MD       DISCUSSION: Patient is a 66 year old female scheduled for the above procedure.  History includes recent former smoker, DM2, HTN, HLD, CKD (stage III), PAD, (with LLE wound) GERD, hiatal hernia. BMI is consistent with obesity.  She was evaluated by cardiologist Dr. Radford Pax on 11/18/21 for preoperative cardiology evaluation. She wrote,  "Preoperative cardiac clearance -She does have multiple cardiac risk factors  -Her perioperative risk of a major cardiac event is 0.9% according to the Revised Cardiac Risk Index (RCRI).  Therefore, the patient is at low risk for perioperative complications.   The patient's  functional capacity is good at 6.05 METs according to the Duke Activity Status Index (DASI) prior to a month ago when her left leg started to bother her. Recommendations: According to ACC/AHA guidelines, no further cardiovascular testing needed.  The patient may proceed to surgery at acceptable risk.    -given her multiple CRFs I will set her up for a coronary Ca score to define future cardiac risk".  Anesthesia team to evaluate on the day of surgery.   VS: BP (!) 175/66   Pulse 75   Temp 36.6 C (Oral)   Resp 17   Ht _0  (1.626 m)   Wt 89.8 kg   LMP 01/18/2011   SpO2 100%   BMI 33.97 kg/m   PROVIDERS: Harlan Stains, MD is PCP  Fransico Him, MD is cardiologist   LABS: Preoperative labs noted. BUN 28, Cr 1.76, eGFR 32. Comparison labs from 11/14/21 show a BUN 34, Cr 1.60 in [facility]--but in [facility] her last two Creatinines were 1.73 on 10/28/21 and 1.86 on 09/16/21, so PAT results seem  consistent with known history of CKD stage III. Marland Kitchen A1c 8.6%, down from 9.1% on 09/16/21 ([place]. (all labs ordered are listed, but only abnormal results are displayed)  Labs Reviewed  GLUCOSE, CAPILLARY - Abnormal; Notable for the following components:      Result Value   Glucose-Capillary 155 (*)    All other components within normal limits  COMPREHENSIVE METABOLIC PANEL - Abnormal; Notable for the following components:   Potassium 3.4 (*)    Chloride 95 (*)    Glucose, Bld 141 (*)    BUN 28 (*)    Creatinine, Ser 1.76 (*)    Total Protein 8.7 (*)    GFR, Estimated 32 (*)    All other components within normal limits  URINALYSIS, ROUTINE W REFLEX MICROSCOPIC - Abnormal; Notable for the following components:   Glucose, UA >=500 (*)    Hgb urine dipstick SMALL (*)    Protein, ur 100 (*)    Bacteria, UA RARE (*)    All other components within normal limits  HEMOGLOBIN A1C - Abnormal; Notable for the following components:   Hgb A1c MFr Bld 8.6 (*)    All other components within normal limits  SURGICAL PCR SCREEN  CBC  PROTIME-INR  APTT  TYPE AND SCREEN     EKG: 11/14/21: Sinus rhythm with frequent Premature ventricular complexes Nonspecific ST and T wave abnormality Abnormal ECG When compared with  ECG of 06-Jul-2007 08:33, PREVIOUS ECG IS PRESENT Premature ventricular complexes , new Confirmed by Shelva Majestic [phone]) on 11/16/2021 8:55:52 AM   CV: Aortogram with LE runoff 11/14/21: Findings: The aorta and iliac segments are heavily calcified however there does not appear to be any flow-limiting stenosis without CO2 or contrast.  The left common femoral artery is subtotally occluded the SFA appears to be flush occluded reconstitutes a heavily diseased artery above the knee.  There appears to be three-vessel runoff to the left foot.  We will plan for left common femoral endarterectomy and left common femoral to below-knee popliteal artery bypass.                Past Medical History:  Diagnosis Date   Diabetes mellitus without complication ([place])    GERD (gastroesophageal reflux disease)    Hiatal hernia 05/01/2013   DG Esophagus    Hyperlipidemia    Hypertension    Kidney disease 05/02/2011    Past Surgical History:  Procedure Laterality Date   ABDOMINAL AORTOGRAM W/LOWER EXTREMITY N/A 11/14/2021   Procedure: ABDOMINAL AORTOGRAM W/LOWER EXTREMITY;  Surgeon: Waynetta Sandy, MD;  Location: [facility];  Service: Cardiovascular;  Laterality: N/A;   BREAST ABSESS DRAINED  7/08   CHOLECYSTECTOMY     ORIF TIBIA PLATEAU  3/09    MEDICATIONS:  acetaminophen (TYLENOL) 500 MG tablet   amLODipine (NORVASC) 10 MG tablet   aspirin 81 MG tablet   atorvastatin (LIPITOR) 40 MG tablet   chlorthalidone (HYGROTON) 25 MG tablet   cholecalciferol (VITAMIN D3) 25 MCG (1000 UNIT) tablet   Cyanocobalamin (B-12-SL) 1000 MCG SUBL   dapagliflozin propanediol (FARXIGA) 10 MG TABS tablet   famotidine (PEPCID) 40 MG tablet   glipiZIDE (GLUCOTROL) 5 MG tablet   Magnesium 250 MG TABS   oxyCODONE-acetaminophen (PERCOCET/ROXICET) 5-325 MG tablet   pioglitazone (ACTOS) 15 MG tablet   potassium chloride SA (KLOR-CON M) 20 MEQ tablet   Semaglutide,0.25 or 0.5MG/DOS, (OZEMPIC, 0.25 OR 0.5 MG/DOSE,) 2 MG/3ML SOPN   No current facility-administered medications for this encounter.    Myra Gianotti, PA-C Surgical Short Stay/Anesthesiology [facility] Phone [phone] [facility] Phone [phone] 12/05/2021 9:44 AM

## 2021-12-05 NOTE — Anesthesia Preprocedure Evaluation (Signed)
Anesthesia Evaluation  Patient identified by MRN, date of birth, ID band Patient awake    Reviewed: Allergy & Precautions, H&P , NPO status , Patient's Chart, lab work & pertinent test results  Airway Mallampati: II   Neck ROM: full    Dental   Pulmonary former smoker,    breath sounds clear to auscultation       Cardiovascular hypertension, + Peripheral Vascular Disease   Rhythm:regular Rate:Normal     Neuro/Psych    GI/Hepatic hiatal hernia, GERD  ,  Endo/Other  diabetes, Type 2  Renal/GU Renal InsufficiencyRenal disease     Musculoskeletal   Abdominal   Peds  Hematology   Anesthesia Other Findings   Reproductive/Obstetrics                            Anesthesia Physical Anesthesia Plan  ASA: 3  Anesthesia Plan: General   Post-op Pain Management:    Induction: Intravenous  PONV Risk Score and Plan: 3 and Ondansetron, Dexamethasone, Midazolam and Treatment may vary due to age or medical condition  Airway Management Planned: Oral ETT  Additional Equipment:   Intra-op Plan:   Post-operative Plan: Extubation in OR  Informed Consent: I have reviewed the patients History and Physical, chart, labs and discussed the procedure including the risks, benefits and alternatives for the proposed anesthesia with the patient or authorized representative who has indicated his/her understanding and acceptance.     Dental advisory given  Plan Discussed with: CRNA, Anesthesiologist and Surgeon  Anesthesia Plan Comments: (PAT note written 12/05/2021 by Myra Gianotti, PA-C. )       Anesthesia Quick Evaluation

## 2021-12-06 ENCOUNTER — Inpatient Hospital Stay (HOSPITAL_COMMUNITY): Payer: HMO

## 2021-12-06 ENCOUNTER — Other Ambulatory Visit: Payer: Self-pay

## 2021-12-06 ENCOUNTER — Encounter (HOSPITAL_COMMUNITY)
Admission: RE | Disposition: A | Discharge status: Home Health | Payer: Self-pay | Source: Home / Self Care | Attending: Vascular Surgery

## 2021-12-06 ENCOUNTER — Ambulatory Visit: Payer: PPO | Admitting: Cardiology

## 2021-12-06 ENCOUNTER — Encounter (HOSPITAL_COMMUNITY): Payer: Self-pay | Admitting: Vascular Surgery

## 2021-12-06 ENCOUNTER — Inpatient Hospital Stay (HOSPITAL_COMMUNITY)
Admission: RE | Admit: 2021-12-06 | Discharge: 2021-12-21 | DRG: 271 | Disposition: A | Discharge status: Home Health | Payer: HMO | Attending: Vascular Surgery | Admitting: Vascular Surgery

## 2021-12-06 ENCOUNTER — Inpatient Hospital Stay (HOSPITAL_COMMUNITY): Payer: HMO | Admitting: Certified Registered Nurse Anesthetist

## 2021-12-06 DIAGNOSIS — Z7984 Long term (current) use of oral hypoglycemic drugs: Secondary | ICD-10-CM | POA: Diagnosis not present

## 2021-12-06 DIAGNOSIS — Y839 Surgical procedure, unspecified as the cause of abnormal reaction of the patient, or of later complication, without mention of misadventure at the time of the procedure: Secondary | ICD-10-CM | POA: Diagnosis not present

## 2021-12-06 DIAGNOSIS — Z9049 Acquired absence of other specified parts of digestive tract: Secondary | ICD-10-CM | POA: Diagnosis not present

## 2021-12-06 DIAGNOSIS — Z823 Family history of stroke: Secondary | ICD-10-CM | POA: Diagnosis not present

## 2021-12-06 DIAGNOSIS — Z7982 Long term (current) use of aspirin: Secondary | ICD-10-CM | POA: Diagnosis not present

## 2021-12-06 DIAGNOSIS — D119 Benign neoplasm of major salivary gland, unspecified: Secondary | ICD-10-CM | POA: Diagnosis not present

## 2021-12-06 DIAGNOSIS — I10 Essential (primary) hypertension: Secondary | ICD-10-CM | POA: Diagnosis not present

## 2021-12-06 DIAGNOSIS — Z7985 Long-term (current) use of injectable non-insulin antidiabetic drugs: Secondary | ICD-10-CM

## 2021-12-06 DIAGNOSIS — E785 Hyperlipidemia, unspecified: Secondary | ICD-10-CM | POA: Diagnosis present

## 2021-12-06 DIAGNOSIS — L7682 Other postprocedural complications of skin and subcutaneous tissue: Secondary | ICD-10-CM | POA: Diagnosis not present

## 2021-12-06 DIAGNOSIS — N289 Disorder of kidney and ureter, unspecified: Secondary | ICD-10-CM | POA: Diagnosis not present

## 2021-12-06 DIAGNOSIS — Z800 Family history of malignant neoplasm of digestive organs: Secondary | ICD-10-CM

## 2021-12-06 DIAGNOSIS — Z87891 Personal history of nicotine dependence: Secondary | ICD-10-CM

## 2021-12-06 DIAGNOSIS — Z833 Family history of diabetes mellitus: Secondary | ICD-10-CM | POA: Diagnosis not present

## 2021-12-06 DIAGNOSIS — I70242 Atherosclerosis of native arteries of left leg with ulceration of calf: Secondary | ICD-10-CM | POA: Diagnosis not present

## 2021-12-06 DIAGNOSIS — I70229 Atherosclerosis of native arteries of extremities with rest pain, unspecified extremity: Principal | ICD-10-CM | POA: Diagnosis present

## 2021-12-06 DIAGNOSIS — L97229 Non-pressure chronic ulcer of left calf with unspecified severity: Secondary | ICD-10-CM | POA: Diagnosis not present

## 2021-12-06 DIAGNOSIS — T8131XA Disruption of external operation (surgical) wound, not elsewhere classified, initial encounter: Secondary | ICD-10-CM | POA: Diagnosis not present

## 2021-12-06 DIAGNOSIS — Z79899 Other long term (current) drug therapy: Secondary | ICD-10-CM

## 2021-12-06 DIAGNOSIS — Z01818 Encounter for other preprocedural examination: Secondary | ICD-10-CM

## 2021-12-06 DIAGNOSIS — Z8249 Family history of ischemic heart disease and other diseases of the circulatory system: Secondary | ICD-10-CM

## 2021-12-06 DIAGNOSIS — E1151 Type 2 diabetes mellitus with diabetic peripheral angiopathy without gangrene: Secondary | ICD-10-CM

## 2021-12-06 DIAGNOSIS — Z79890 Hormone replacement therapy (postmenopausal): Secondary | ICD-10-CM

## 2021-12-06 DIAGNOSIS — K219 Gastro-esophageal reflux disease without esophagitis: Secondary | ICD-10-CM | POA: Diagnosis present

## 2021-12-06 DIAGNOSIS — B965 Pseudomonas (aeruginosa) (mallei) (pseudomallei) as the cause of diseases classified elsewhere: Secondary | ICD-10-CM | POA: Diagnosis not present

## 2021-12-06 DIAGNOSIS — D62 Acute posthemorrhagic anemia: Secondary | ICD-10-CM | POA: Diagnosis not present

## 2021-12-06 DIAGNOSIS — I70262 Atherosclerosis of native arteries of extremities with gangrene, left leg: Secondary | ICD-10-CM | POA: Diagnosis not present

## 2021-12-06 DIAGNOSIS — T8450XA Infection and inflammatory reaction due to unspecified internal joint prosthesis, initial encounter: Secondary | ICD-10-CM | POA: Diagnosis not present

## 2021-12-06 DIAGNOSIS — E1152 Type 2 diabetes mellitus with diabetic peripheral angiopathy with gangrene: Secondary | ICD-10-CM | POA: Diagnosis not present

## 2021-12-06 HISTORY — PX: ENDARTERECTOMY FEMORAL: SHX5804

## 2021-12-06 HISTORY — PX: FEMORAL-POPLITEAL BYPASS GRAFT: SHX937

## 2021-12-06 LAB — CBC
HCT: 33.7 % — ABNORMAL LOW (ref 36.0–46.0)
Hemoglobin: 10.9 g/dL — ABNORMAL LOW (ref 12.0–15.0)
MCH: 27 pg (ref 26.0–34.0)
MCHC: 32.3 g/dL (ref 30.0–36.0)
MCV: 83.4 fL (ref 80.0–100.0)
Platelets: 297 10*3/uL (ref 150–400)
RBC: 4.04 MIL/uL (ref 3.87–5.11)
RDW: 14.9 % (ref 11.5–15.5)
WBC: 10.9 10*3/uL — ABNORMAL HIGH (ref 4.0–10.5)
nRBC: 0 % (ref 0.0–0.2)

## 2021-12-06 LAB — POCT I-STAT 7, (LYTES, BLD GAS, ICA,H+H)
Acid-base deficit: 1 mmol/L (ref 0.0–2.0)
Acid-base deficit: 1 mmol/L (ref 0.0–2.0)
Bicarbonate: 22.8 mmol/L (ref 20.0–28.0)
Bicarbonate: 23.3 mmol/L (ref 20.0–28.0)
Calcium, Ion: 1.2 mmol/L (ref 1.15–1.40)
Calcium, Ion: 1.19 mmol/L (ref 1.15–1.40)
HCT: 35 % — ABNORMAL LOW (ref 36.0–46.0)
HCT: 32 % — ABNORMAL LOW (ref 36.0–46.0)
Hemoglobin: 11.9 g/dL — ABNORMAL LOW (ref 12.0–15.0)
Hemoglobin: 10.9 g/dL — ABNORMAL LOW (ref 12.0–15.0)
O2 Saturation: 100 %
O2 Saturation: 99 %
Potassium: 3.2 mmol/L — ABNORMAL LOW (ref 3.5–5.1)
Potassium: 3.3 mmol/L — ABNORMAL LOW (ref 3.5–5.1)
Sodium: 137 mmol/L (ref 135–145)
Sodium: 136 mmol/L (ref 135–145)
TCO2: 24 mmol/L (ref 22–32)
TCO2: 24 mmol/L (ref 22–32)
pCO2 arterial: 33.2 mmHg (ref 32–48)
pCO2 arterial: 35.2 mmHg (ref 32–48)
pH, Arterial: 7.444 (ref 7.35–7.45)
pH, Arterial: 7.429 (ref 7.35–7.45)
pO2, Arterial: 205 mmHg — ABNORMAL HIGH (ref 83–108)
pO2, Arterial: 160 mmHg — ABNORMAL HIGH (ref 83–108)

## 2021-12-06 LAB — POCT ACTIVATED CLOTTING TIME
Activated Clotting Time: 143 seconds
Activated Clotting Time: 257 seconds
Activated Clotting Time: 299 seconds
Activated Clotting Time: 155 seconds

## 2021-12-06 LAB — GLUCOSE, CAPILLARY
Glucose-Capillary: 161 mg/dL — ABNORMAL HIGH (ref 70–99)
Glucose-Capillary: 162 mg/dL — ABNORMAL HIGH (ref 70–99)
Glucose-Capillary: 173 mg/dL — ABNORMAL HIGH (ref 70–99)
Glucose-Capillary: 149 mg/dL — ABNORMAL HIGH (ref 70–99)
Glucose-Capillary: 175 mg/dL — ABNORMAL HIGH (ref 70–99)
Glucose-Capillary: 163 mg/dL — ABNORMAL HIGH (ref 70–99)
Glucose-Capillary: 126 mg/dL — ABNORMAL HIGH (ref 70–99)

## 2021-12-06 LAB — ABO/RH: ABO/RH(D): O POS

## 2021-12-06 LAB — CREATININE, SERUM
Creatinine, Ser: 1.39 mg/dL — ABNORMAL HIGH (ref 0.44–1.00)
GFR, Estimated: 42 mL/min — ABNORMAL LOW (ref >60)

## 2021-12-06 SURGERY — ENDARTERECTOMY, FEMORAL
Anesthesia: General | Laterality: Left

## 2021-12-06 MED ORDER — SODIUM CHLORIDE 0.9 % IV SOLN: INTRAVENOUS | Status: DC

## 2021-12-06 MED ORDER — OXYCODONE HCL 5 MG/5ML PO SOLN: 5.0000 mg | Freq: Once | ORAL | Status: DC | PRN

## 2021-12-06 MED ORDER — HEPARIN 6000 UNIT IRRIGATION SOLUTION
Status: DC | PRN
  Administered 2021-12-06: 1

## 2021-12-06 MED ORDER — ONDANSETRON HCL 4 MG/2ML IJ SOLN: 4.0000 mg | Freq: Four times a day (QID) | INTRAMUSCULAR | Status: DC | PRN

## 2021-12-06 MED ORDER — ROCURONIUM BROMIDE 10 MG/ML (PF) SYRINGE
PREFILLED_SYRINGE | INTRAVENOUS | Status: DC | PRN
  Administered 2021-12-06: 40 mg via INTRAVENOUS
  Administered 2021-12-06 (×2): 50 mg via INTRAVENOUS

## 2021-12-06 MED ORDER — PHENOL 1.4 % MT LIQD
1.0000 | OROMUCOSAL | Status: DC | PRN
Start: 2021-12-06 — End: 2021-12-21

## 2021-12-06 MED ORDER — FENTANYL CITRATE (PF) 100 MCG/2ML IJ SOLN
INTRAMUSCULAR | Status: AC
  Filled 2021-12-06: qty 2

## 2021-12-06 MED ORDER — PROTAMINE SULFATE 10 MG/ML IV SOLN
INTRAVENOUS | Status: AC
  Filled 2021-12-06: qty 5

## 2021-12-06 MED ORDER — POTASSIUM CHLORIDE 10 MEQ/100ML IV SOLN
10.0000 meq | INTRAVENOUS | Status: AC
  Administered 2021-12-06 (×2): 10 meq via INTRAVENOUS
  Filled 2021-12-06: qty 100

## 2021-12-06 MED ORDER — MORPHINE SULFATE (PF) 2 MG/ML IV SOLN
2.0000 mg | INTRAVENOUS | Status: DC | PRN
  Administered 2021-12-06 – 2021-12-21 (×4): 2 mg via INTRAVENOUS
  Filled 2021-12-06 (×6): qty 1

## 2021-12-06 MED ORDER — AMLODIPINE BESYLATE 10 MG PO TABS
10.0000 mg | ORAL_TABLET | Freq: Every day | ORAL | Status: DC
  Administered 2021-12-07 – 2021-12-21 (×14): 10 mg via ORAL
  Filled 2021-12-06 (×15): qty 1

## 2021-12-06 MED ORDER — SUGAMMADEX SODIUM 200 MG/2ML IV SOLN
INTRAVENOUS | Status: DC | PRN
  Administered 2021-12-06: 178.8 mg via INTRAVENOUS

## 2021-12-06 MED ORDER — ALUM & MAG HYDROXIDE-SIMETH 200-200-20 MG/5ML PO SUSP: 15.0000 mL | ORAL | Status: DC | PRN

## 2021-12-06 MED ORDER — ACETAMINOPHEN 650 MG RE SUPP: 325.0000 mg | RECTAL | Status: DC | PRN

## 2021-12-06 MED ORDER — PHENYLEPHRINE 80 MCG/ML (10ML) SYRINGE FOR IV PUSH (FOR BLOOD PRESSURE SUPPORT)
PREFILLED_SYRINGE | INTRAVENOUS | Status: DC | PRN
  Administered 2021-12-06: 40 ug via INTRAVENOUS
  Administered 2021-12-06 (×2): 80 ug via INTRAVENOUS
  Administered 2021-12-06: 40 ug via INTRAVENOUS
  Administered 2021-12-06: 80 ug via INTRAVENOUS

## 2021-12-06 MED ORDER — FAMOTIDINE 20 MG PO TABS
40.0000 mg | ORAL_TABLET | Freq: Every day | ORAL | Status: DC
  Administered 2021-12-07 – 2021-12-21 (×14): 40 mg via ORAL
  Filled 2021-12-06 (×14): qty 2

## 2021-12-06 MED ORDER — CHLORHEXIDINE GLUCONATE 0.12 % MT SOLN
15.0000 mL | Freq: Once | OROMUCOSAL | Status: AC
  Administered 2021-12-06: 15 mL via OROMUCOSAL
  Filled 2021-12-06: qty 15

## 2021-12-06 MED ORDER — PHENYLEPHRINE 80 MCG/ML (10ML) SYRINGE FOR IV PUSH (FOR BLOOD PRESSURE SUPPORT)
PREFILLED_SYRINGE | INTRAVENOUS | Status: AC
  Filled 2021-12-06: qty 10

## 2021-12-06 MED ORDER — 0.9 % SODIUM CHLORIDE (POUR BTL) OPTIME
TOPICAL | Status: DC | PRN
  Administered 2021-12-06: 2000 mL

## 2021-12-06 MED ORDER — POTASSIUM CHLORIDE CRYS ER 20 MEQ PO TBCR: 20.0000 meq | EXTENDED_RELEASE_TABLET | Freq: Every day | ORAL | Status: DC | PRN

## 2021-12-06 MED ORDER — ATORVASTATIN CALCIUM 40 MG PO TABS
40.0000 mg | ORAL_TABLET | Freq: Every day | ORAL | Status: DC
  Administered 2021-12-07 – 2021-12-21 (×14): 40 mg via ORAL
  Filled 2021-12-06 (×15): qty 1

## 2021-12-06 MED ORDER — DOCUSATE SODIUM 100 MG PO CAPS
100.0000 mg | ORAL_CAPSULE | Freq: Every day | ORAL | Status: DC
  Administered 2021-12-07 – 2021-12-21 (×6): 100 mg via ORAL
  Filled 2021-12-06 (×10): qty 1

## 2021-12-06 MED ORDER — SODIUM CHLORIDE 0.9 % IV SOLN: INTRAVENOUS | Status: DC | PRN

## 2021-12-06 MED ORDER — LABETALOL HCL 5 MG/ML IV SOLN: 10.0000 mg | INTRAVENOUS | Status: DC | PRN

## 2021-12-06 MED ORDER — SODIUM CHLORIDE 0.9 % IV SOLN
INTRAVENOUS | Status: AC
Start: 2021-12-06 — End: 2021-12-07

## 2021-12-06 MED ORDER — PROPOFOL 10 MG/ML IV BOLUS
INTRAVENOUS | Status: AC
  Filled 2021-12-06: qty 20

## 2021-12-06 MED ORDER — CEFAZOLIN SODIUM-DEXTROSE 2-4 GM/100ML-% IV SOLN
2.0000 g | Freq: Three times a day (TID) | INTRAVENOUS | Status: AC
  Administered 2021-12-06 (×2): 2 g via INTRAVENOUS
  Filled 2021-12-06 (×2): qty 100

## 2021-12-06 MED ORDER — ASPIRIN 81 MG PO CHEW
81.0000 mg | CHEWABLE_TABLET | Freq: Every day | ORAL | Status: DC
  Administered 2021-12-07 – 2021-12-21 (×14): 81 mg via ORAL
  Filled 2021-12-06 (×16): qty 1

## 2021-12-06 MED ORDER — METOPROLOL TARTRATE 5 MG/5ML IV SOLN: 2.0000 mg | INTRAVENOUS | Status: DC | PRN

## 2021-12-06 MED ORDER — ROCURONIUM BROMIDE 10 MG/ML (PF) SYRINGE
PREFILLED_SYRINGE | INTRAVENOUS | Status: AC
  Filled 2021-12-06: qty 10

## 2021-12-06 MED ORDER — HEPARIN SODIUM (PORCINE) 1000 UNIT/ML IJ SOLN
INTRAMUSCULAR | Status: AC
  Filled 2021-12-06: qty 10

## 2021-12-06 MED ORDER — LIDOCAINE 2% (20 MG/ML) 5 ML SYRINGE
INTRAMUSCULAR | Status: AC
  Filled 2021-12-06: qty 5

## 2021-12-06 MED ORDER — HEMOSTATIC AGENTS (NO CHARGE) OPTIME
TOPICAL | Status: DC | PRN
  Administered 2021-12-06: 1 via TOPICAL

## 2021-12-06 MED ORDER — DEXAMETHASONE SODIUM PHOSPHATE 10 MG/ML IJ SOLN
INTRAMUSCULAR | Status: AC
  Filled 2021-12-06: qty 1

## 2021-12-06 MED ORDER — HEPARIN 6000 UNIT IRRIGATION SOLUTION
Status: AC
  Filled 2021-12-06: qty 500

## 2021-12-06 MED ORDER — CHLORTHALIDONE 25 MG PO TABS
25.0000 mg | ORAL_TABLET | Freq: Every day | ORAL | Status: DC
  Administered 2021-12-06 – 2021-12-21 (×15): 25 mg via ORAL
  Filled 2021-12-06 (×16): qty 1

## 2021-12-06 MED ORDER — OXYCODONE-ACETAMINOPHEN 5-325 MG PO TABS
1.0000 | ORAL_TABLET | ORAL | Status: DC | PRN
  Administered 2021-12-06 – 2021-12-16 (×42): 2 via ORAL
  Administered 2021-12-16: 1 via ORAL
  Administered 2021-12-16 – 2021-12-21 (×25): 2 via ORAL
  Filled 2021-12-06 (×2): qty 2
  Filled 2021-12-06: qty 1
  Filled 2021-12-06 (×6): qty 2
  Filled 2021-12-06: qty 1
  Filled 2021-12-06 (×60): qty 2

## 2021-12-06 MED ORDER — MAGNESIUM SULFATE 2 GM/50ML IV SOLN: 2.0000 g | Freq: Every day | INTRAVENOUS | Status: DC | PRN

## 2021-12-06 MED ORDER — HEPARIN SODIUM (PORCINE) 1000 UNIT/ML IJ SOLN
INTRAMUSCULAR | Status: DC | PRN
  Administered 2021-12-06: 10000 [IU] via INTRAVENOUS

## 2021-12-06 MED ORDER — ACETAMINOPHEN 325 MG PO TABS
325.0000 mg | ORAL_TABLET | ORAL | Status: DC | PRN
  Administered 2021-12-06: 650 mg via ORAL
  Filled 2021-12-06 (×2): qty 2

## 2021-12-06 MED ORDER — LACTATED RINGERS IV SOLN: INTRAVENOUS | Status: DC

## 2021-12-06 MED ORDER — ONDANSETRON HCL 4 MG/2ML IJ SOLN
INTRAMUSCULAR | Status: AC
  Filled 2021-12-06: qty 2

## 2021-12-06 MED ORDER — OXYCODONE HCL 5 MG PO TABS: 5.0000 mg | ORAL_TABLET | Freq: Once | ORAL | Status: DC | PRN

## 2021-12-06 MED ORDER — MIDAZOLAM HCL 2 MG/2ML IJ SOLN
INTRAMUSCULAR | Status: DC | PRN
  Administered 2021-12-06: 2 mg via INTRAVENOUS

## 2021-12-06 MED ORDER — ORAL CARE MOUTH RINSE: 15.0000 mL | Freq: Once | OROMUCOSAL | Status: AC

## 2021-12-06 MED ORDER — HEPARIN SODIUM (PORCINE) 5000 UNIT/ML IJ SOLN
5000.0000 [IU] | Freq: Three times a day (TID) | INTRAMUSCULAR | Status: DC
  Administered 2021-12-07 – 2021-12-21 (×42): 5000 [IU] via SUBCUTANEOUS
  Filled 2021-12-06 (×41): qty 1

## 2021-12-06 MED ORDER — LIDOCAINE 2% (20 MG/ML) 5 ML SYRINGE
INTRAMUSCULAR | Status: DC | PRN
  Administered 2021-12-06: 60 mg via INTRAVENOUS

## 2021-12-06 MED ORDER — GUAIFENESIN-DM 100-10 MG/5ML PO SYRP: 15.0000 mL | ORAL_SOLUTION | ORAL | Status: DC | PRN

## 2021-12-06 MED ORDER — PROPOFOL 10 MG/ML IV BOLUS
INTRAVENOUS | Status: DC | PRN
  Administered 2021-12-06: 130 mg via INTRAVENOUS

## 2021-12-06 MED ORDER — INSULIN ASPART 100 UNIT/ML IJ SOLN
INTRAMUSCULAR | Status: AC
  Administered 2021-12-06: 2 [IU] via SUBCUTANEOUS
  Filled 2021-12-06: qty 1

## 2021-12-06 MED ORDER — MIDAZOLAM HCL 2 MG/2ML IJ SOLN
INTRAMUSCULAR | Status: AC
  Filled 2021-12-06: qty 2

## 2021-12-06 MED ORDER — SODIUM CHLORIDE 0.9 % IV SOLN: 500.0000 mL | Freq: Once | INTRAVENOUS | Status: DC | PRN

## 2021-12-06 MED ORDER — PROTAMINE SULFATE 10 MG/ML IV SOLN
INTRAVENOUS | Status: DC | PRN
  Administered 2021-12-06 (×5): 10 mg via INTRAVENOUS

## 2021-12-06 MED ORDER — PANTOPRAZOLE SODIUM 40 MG PO TBEC: 40.0000 mg | DELAYED_RELEASE_TABLET | Freq: Every day | ORAL | Status: DC

## 2021-12-06 MED ORDER — ONDANSETRON HCL 4 MG/2ML IJ SOLN
INTRAMUSCULAR | Status: DC | PRN
  Administered 2021-12-06: 4 mg via INTRAVENOUS

## 2021-12-06 MED ORDER — CHLORHEXIDINE GLUCONATE CLOTH 2 % EX PADS: 6.0000 | MEDICATED_PAD | Freq: Once | CUTANEOUS | Status: DC

## 2021-12-06 MED ORDER — FENTANYL CITRATE (PF) 250 MCG/5ML IJ SOLN
INTRAMUSCULAR | Status: AC
  Filled 2021-12-06: qty 5

## 2021-12-06 MED ORDER — INSULIN ASPART 100 UNIT/ML IJ SOLN
0.0000 [IU] | Freq: Three times a day (TID) | INTRAMUSCULAR | Status: DC
  Administered 2021-12-06: 3 [IU] via SUBCUTANEOUS
  Administered 2021-12-07 (×2): 5 [IU] via SUBCUTANEOUS
  Administered 2021-12-08: 2 [IU] via SUBCUTANEOUS
  Administered 2021-12-08: 8 [IU] via SUBCUTANEOUS
  Administered 2021-12-08: 3 [IU] via SUBCUTANEOUS
  Administered 2021-12-09: 5 [IU] via SUBCUTANEOUS
  Administered 2021-12-09: 2 [IU] via SUBCUTANEOUS
  Administered 2021-12-09: 5 [IU] via SUBCUTANEOUS
  Administered 2021-12-10: 3 [IU] via SUBCUTANEOUS
  Administered 2021-12-10: 5 [IU] via SUBCUTANEOUS
  Administered 2021-12-10: 3 [IU] via SUBCUTANEOUS
  Administered 2021-12-11: 8 [IU] via SUBCUTANEOUS
  Administered 2021-12-11: 5 [IU] via SUBCUTANEOUS
  Administered 2021-12-11 – 2021-12-12 (×2): 2 [IU] via SUBCUTANEOUS
  Administered 2021-12-12 – 2021-12-13 (×3): 3 [IU] via SUBCUTANEOUS
  Administered 2021-12-13: 2 [IU] via SUBCUTANEOUS
  Administered 2021-12-14: 5 [IU] via SUBCUTANEOUS
  Administered 2021-12-14 – 2021-12-15 (×4): 3 [IU] via SUBCUTANEOUS
  Administered 2021-12-15 – 2021-12-16 (×3): 5 [IU] via SUBCUTANEOUS
  Administered 2021-12-16 – 2021-12-17 (×3): 3 [IU] via SUBCUTANEOUS
  Administered 2021-12-18: 5 [IU] via SUBCUTANEOUS
  Administered 2021-12-18: 8 [IU] via SUBCUTANEOUS
  Administered 2021-12-18: 3 [IU] via SUBCUTANEOUS
  Administered 2021-12-19: 5 [IU] via SUBCUTANEOUS
  Administered 2021-12-19: 3 [IU] via SUBCUTANEOUS
  Administered 2021-12-19: 2 [IU] via SUBCUTANEOUS
  Administered 2021-12-20 (×2): 5 [IU] via SUBCUTANEOUS
  Administered 2021-12-20: 2 [IU] via SUBCUTANEOUS
  Administered 2021-12-21 (×2): 3 [IU] via SUBCUTANEOUS

## 2021-12-06 MED ORDER — CEFAZOLIN SODIUM-DEXTROSE 2-4 GM/100ML-% IV SOLN
2.0000 g | INTRAVENOUS | Status: AC
  Administered 2021-12-06: 2 g via INTRAVENOUS
  Filled 2021-12-06: qty 100

## 2021-12-06 MED ORDER — FENTANYL CITRATE (PF) 250 MCG/5ML IJ SOLN
INTRAMUSCULAR | Status: DC | PRN
  Administered 2021-12-06 (×2): 25 ug via INTRAVENOUS
  Administered 2021-12-06: 100 ug via INTRAVENOUS
  Administered 2021-12-06: 50 ug via INTRAVENOUS

## 2021-12-06 MED ORDER — HYDRALAZINE HCL 20 MG/ML IJ SOLN: 5.0000 mg | INTRAMUSCULAR | Status: DC | PRN

## 2021-12-06 MED ORDER — INSULIN ASPART 100 UNIT/ML IJ SOLN
0.0000 [IU] | INTRAMUSCULAR | Status: AC | PRN
  Administered 2021-12-06 (×2): 2 [IU] via SUBCUTANEOUS

## 2021-12-06 MED ORDER — FENTANYL CITRATE (PF) 100 MCG/2ML IJ SOLN
25.0000 ug | INTRAMUSCULAR | Status: DC | PRN
  Administered 2021-12-06 (×3): 50 ug via INTRAVENOUS

## 2021-12-06 SURGICAL SUPPLY — 39 items
BAG COUNTER SPONGE SURGICOUNT (BAG) ×2 IMPLANT
CANISTER SUCT 3000ML PPV (MISCELLANEOUS) ×2 IMPLANT
CANNULA VESSEL 3MM 2 BLNT TIP (CANNULA) ×2 IMPLANT
CLIP LIGATING EXTRA MED SLVR (CLIP) ×2 IMPLANT
COVER SURGICAL LIGHT HANDLE (MISCELLANEOUS) ×1 IMPLANT
DERMABOND ADVANCED (GAUZE/BANDAGES/DRESSINGS) ×6
DERMABOND ADVANCED .7 DNX12 (GAUZE/BANDAGES/DRESSINGS) ×1 IMPLANT
ELECT PENCIL ROCKER SW 15FT (MISCELLANEOUS) ×1 IMPLANT
ELECT REM PT RETURN 9FT ADLT (ELECTROSURGICAL) ×2
ELECTRODE REM PT RTRN 9FT ADLT (ELECTROSURGICAL) ×1 IMPLANT
GLOVE BIO SURGEON STRL SZ7.5 (GLOVE) ×2 IMPLANT
GOWN STRL REUS W/ TWL LRG LVL3 (GOWN DISPOSABLE) ×2 IMPLANT
GOWN STRL REUS W/ TWL XL LVL3 (GOWN DISPOSABLE) ×1 IMPLANT
GOWN STRL REUS W/TWL LRG LVL3 (GOWN DISPOSABLE) ×4
GOWN STRL REUS W/TWL XL LVL3 (GOWN DISPOSABLE) ×4
GRAFT PROPATEN W/RING 6X80X60 (Vascular Products) ×1 IMPLANT
INSERT FOGARTY SM (MISCELLANEOUS) ×1 IMPLANT
KIT BASIN OR (CUSTOM PROCEDURE TRAY) ×2 IMPLANT
KIT TURNOVER KIT B (KITS) ×2 IMPLANT
NS IRRIG 1000ML POUR BTL (IV SOLUTION) ×4 IMPLANT
PACK PERIPHERAL VASCULAR (CUSTOM PROCEDURE TRAY) ×2 IMPLANT
PAD ARMBOARD 7.5X6 YLW CONV (MISCELLANEOUS) ×4 IMPLANT
POWDER SURGICEL 3.0 GRAM (HEMOSTASIS) ×1 IMPLANT
SHEATH PROBE COVER 6X72 (BAG) ×1 IMPLANT
SPONGE T-LAP 18X18 ~~LOC~~+RFID (SPONGE) ×1 IMPLANT
SUT MNCRL AB 4-0 PS2 18 (SUTURE) ×5 IMPLANT
SUT PROLENE 5 0 C 1 24 (SUTURE) ×5 IMPLANT
SUT PROLENE 6 0 BV (SUTURE) ×5 IMPLANT
SUT SILK 2 0 SH (SUTURE) ×2 IMPLANT
SUT VIC AB 2-0 CT1 27 (SUTURE) ×6
SUT VIC AB 2-0 CT1 TAPERPNT 27 (SUTURE) ×2 IMPLANT
SUT VIC AB 3-0 SH 27 (SUTURE) ×6
SUT VIC AB 3-0 SH 27X BRD (SUTURE) ×2 IMPLANT
TOWEL GREEN STERILE (TOWEL DISPOSABLE) ×4 IMPLANT
TOWEL GREEN STERILE FF (TOWEL DISPOSABLE) ×2 IMPLANT
TRAY FOLEY MTR SLVR 16FR STAT (SET/KITS/TRAYS/PACK) ×1 IMPLANT
TRAY FOLEY W/BAG SLVR 14FR (SET/KITS/TRAYS/PACK) ×1 IMPLANT
UNDERPAD 30X36 HEAVY ABSORB (UNDERPADS AND DIAPERS) ×2 IMPLANT
WATER STERILE IRR 1000ML POUR (IV SOLUTION) ×2 IMPLANT

## 2021-12-06 NOTE — Evaluation (Signed)
Physical Therapy Evaluation Patient Details Name: [name] Houston MRN: [id number] DOB: 18-Sep-1955 Today's Date: 12/06/2021  History of Present Illness  Pt is 66 yo female who presented on 8/8 with slow healing wound L calf and underwent L common femoral endarterectomy with L FPBG. PMH: DMII, GERD, HLD, HTN, kidney disease, smoker.  Clinical Impression  Pt admitted with above diagnosis. Pt limited by pain on eval. Is from home with husband and was independently ambulating with cane since R ankle fx some time ago. However, currently husband is hospitalized in [place] and sister is present from [place]. Husband present on phone and relays that he will be discharged soon. Pt needed max A to come to EOB and return to supine, could not tolerate standing on eval. Expect her to progress well though once pain level lowers.   Pt currently with functional limitations due to the deficits listed below (see PT Problem List). Pt will benefit from skilled PT to increase their independence and safety with mobility to allow discharge to the venue listed below.          Recommendations for follow up therapy are one component of a multi-disciplinary discharge planning process, led by the attending physician.  Recommendations may be updated based on patient status, additional functional criteria and insurance authorization.  Follow Up Recommendations Home health PT      Assistance Recommended at Discharge Intermittent Supervision/Assistance  Patient can return home with the following  A little help with walking and/or transfers;A little help with bathing/dressing/bathroom;Assistance with cooking/housework;Assist for transportation;Help with stairs or ramp for entrance    Equipment Recommendations Rolling walker (2 wheels)  Recommendations for Other Services  OT consult    Functional Status Assessment Patient has had a recent decline in their functional status and demonstrates the ability to make  significant improvements in function in a reasonable and predictable amount of time.     Precautions / Restrictions Precautions Precautions: None Restrictions Weight Bearing Restrictions: No      Mobility  Bed Mobility Overal bed mobility: Needs Assistance Bed Mobility: Supine to Sit, Sit to Supine     Supine to sit: Max assist Sit to supine: Max assist   General bed mobility comments: max A for LE's off EOB and LLE supported throughout. Pt needed max A at trunk to pull up into sitting as well. Max A at LE's for return to supine and +2 tot A for repositioning in bed    Transfers                   General transfer comment: pt unable to tolerate standing today    Ambulation/Gait                  Stairs            Wheelchair Mobility    Modified Rankin (Stroke Patients Only)       Balance Overall balance assessment: Needs assistance Sitting-balance support: Feet supported, Bilateral upper extremity supported Sitting balance-Leahy Scale: Fair                                       Pertinent Vitals/Pain Pain Assessment Pain Assessment: 0-10 Pain Score: 10-Worst pain ever Pain Location: L upper thigh Pain Descriptors / Indicators: Grimacing, Guarding, Sore, Sharp Pain Intervention(s): Limited activity within patient's tolerance, Monitored during session, Patient requesting pain meds-RN notified, RN gave pain meds during  session    Home Living Family/patient expects to be discharged to:: Private residence Living Arrangements: Spouse/significant other Available Help at Discharge: Family;Available 24 hours/day Type of Home: House Home Access: Stairs to enter Entrance Stairs-Rails: Right Entrance Stairs-Number of Steps: 4   Home Layout: One level Home Equipment: Cane - single point Additional Comments: pt's husband currently in hospital in [place], he is a liver transplant pt. Sister from [place] here temporarily.  Husband on phone and reports he will be discharged soon    Prior Function Prior Level of Function : Independent/Modified Independent             Mobility Comments: pt has been mobilizing independently with SPC but reports she broke her R ankle recently has not driven since then. Her husband drives       Hand Dominance   Dominant Hand: Right    Extremity/Trunk Assessment   Upper Extremity Assessment Upper Extremity Assessment: Overall WFL for tasks assessed    Lower Extremity Assessment Lower Extremity Assessment: LLE deficits/detail LLE Deficits / Details: wound L posterior calf, pt denies pain there. Pt with inner thigh incision that is very painful. Unable to lift LLE against gravity. Swelling noted L foot and limited ankle and knee ROM LLE: Unable to fully assess due to pain LLE Sensation: WNL LLE Coordination: decreased gross motor    Cervical / Trunk Assessment Cervical / Trunk Assessment: Normal  Communication   Communication: No difficulties  Cognition Arousal/Alertness: Awake/alert Behavior During Therapy: WFL for tasks assessed/performed Overall Cognitive Status: Within Functional Limits for tasks assessed                                          General Comments General comments (skin integrity, edema, etc.): Sister present and husband on phone. Pt in a lot of pain LLE. Reviewed proper positioning and mobility goals as pain decreases. Pt reports understanding    Exercises General Exercises - Lower Extremity Ankle Circles/Pumps: AROM, Both, 10 reps, Supine Quad Sets: AROM, Both, 10 reps, Supine   Assessment/Plan    PT Assessment Patient needs continued PT services  PT Problem List Decreased strength;Decreased activity tolerance;Decreased balance;Decreased range of motion;Decreased mobility;Decreased knowledge of use of DME;Decreased knowledge of precautions;Pain;Decreased skin integrity       PT Treatment Interventions DME  instruction;Gait training;Stair training;Functional mobility training;Therapeutic activities;Therapeutic exercise;Balance training;Patient/family education    PT Goals (Current goals can be found in the Care Plan section)  Acute Rehab PT Goals Patient Stated Goal: return home PT Goal Formulation: With patient Time For Goal Achievement: 12/20/21 Potential to Achieve Goals: Good    Frequency Min 3X/week     Co-evaluation               AM-PAC PT "6 Clicks" Mobility  Outcome Measure Help needed turning from your back to your side while in a flat bed without using bedrails?: A Lot Help needed moving from lying on your back to sitting on the side of a flat bed without using bedrails?: A Lot Help needed moving to and from a bed to a chair (including a wheelchair)?: A Lot Help needed standing up from a chair using your arms (e.g., wheelchair or bedside chair)?: Total Help needed to walk in hospital room?: Total Help needed climbing 3-5 steps with a railing? : Total 6 Click Score: 9    End of Session   Activity Tolerance:  Patient limited by pain Patient left: in bed;with call bell/phone within reach;with bed alarm set;with family/visitor present;with nursing/sitter in room Nurse Communication: Mobility status PT Visit Diagnosis: Pain;Difficulty in walking, not elsewhere classified (R26.2) Pain - Right/Left: Left Pain - part of body: Leg    Time: 7282-0601 PT Time Calculation (min) (ACUTE ONLY): 28 min   Charges:   PT Evaluation $PT Eval Moderate Complexity: 1 Mod PT Treatments $Therapeutic Activity: 8-22 mins        Leighton Roach, PT  Acute Rehab Services Secure chat preferred Office [place] 12/06/2021, 4:34 PM

## 2021-12-06 NOTE — H&P (Signed)
H+P  HPI [name] Houston is a 66 y.o. female without significant vascular history.  She does have diabetes, hypertension hyperlipidemia.  She is a former smoker quit 3 weeks ago.  She previously took aspirin stopped taking this about 1 month ago at the direction of her physician.  She does take Pravachol daily without any complications from this.  In the last month she has a wound on the left posterior calf for which she is seeing the wound care center placing [facility].  She states the wound has been very slow to improve she denies any fevers or chills.  No previous vascular intervention.  Denies personal family history of aneurysm disease.  She denies any claudication.  She has associated leg swelling in the left lower extremity.    Past Medical History:  Diagnosis Date   Diabetes mellitus without complication ([place])    GERD (gastroesophageal reflux disease)    Hiatal hernia 05/01/2013   DG Esophagus    Hyperlipidemia    Hypertension    Kidney disease 05/02/2011    Past Surgical History:  Procedure Laterality Date   ABDOMINAL AORTOGRAM W/LOWER EXTREMITY N/A 11/14/2021   Procedure: ABDOMINAL AORTOGRAM W/LOWER EXTREMITY;  Surgeon: Waynetta Sandy, MD;  Location: [facility];  Service: Cardiovascular;  Laterality: N/A;   BREAST ABSESS DRAINED  7/08   CHOLECYSTECTOMY     ORIF TIBIA PLATEAU  3/09    No Known Allergies  Prior to Admission medications   Medication Sig Start Date End Date Taking? Authorizing Provider  acetaminophen (TYLENOL) 500 MG tablet Take 1,000 mg by mouth every 6 (six) hours as needed for moderate pain.   Yes [provider]  amLODipine (NORVASC) 10 MG tablet Take 10 mg by mouth daily.   Yes [provider]  aspirin 81 MG tablet Take 81 mg by mouth daily.   Yes [provider]  atorvastatin (LIPITOR) 40 MG tablet Take 40 mg by mouth daily.   Yes [provider]  chlorthalidone (HYGROTON) 25 MG tablet Take  25 mg by mouth daily.   Yes [provider]  cholecalciferol (VITAMIN D3) 25 MCG (1000 UNIT) tablet Take 1,000 Units by mouth daily.   Yes [provider]  Cyanocobalamin (B-12-SL) 1000 MCG SUBL Place 1,000 mcg under the tongue daily.   Yes [provider]  dapagliflozin propanediol (FARXIGA) 10 MG TABS tablet Take 10 mg by mouth daily.   Yes [provider]  famotidine (PEPCID) 40 MG tablet Take 40 mg by mouth See admin instructions. Take 40 mg daily, may take a second 40 mg dose as needed for heartburn   Yes [provider]  glipiZIDE (GLUCOTROL) 5 MG tablet Take 5 mg by mouth 2 (two) times daily. 07/17/20  Yes [provider]  Magnesium 250 MG TABS Take 250 mg by mouth 2 (two) times daily.   Yes [provider]  oxyCODONE-acetaminophen (PERCOCET/ROXICET) 5-325 MG tablet Take 1 tablet by mouth every 6 (six) hours as needed for severe pain.   Yes [provider]  pioglitazone (ACTOS) 15 MG tablet Take 15 mg by mouth daily. 05/18/20  Yes [provider]  potassium chloride SA (KLOR-CON M) 20 MEQ tablet Take 20 mEq by mouth daily.   Yes [provider]  Semaglutide,0.25 or 0.5MG/DOS, (OZEMPIC, 0.25 OR 0.5 MG/DOSE,) 2 MG/3ML SOPN Take 0.5 mg by mouth every Thursday. 09/20/21  Yes [provider]    Social History   Socioeconomic History   Marital status:  Married    Spouse name: Not on file   Number of children: Not on file   Years of education: Not on file   Highest education level: Not on file  Occupational History   Not on file  Tobacco Use   Smoking status: Former    Packs/day: 0.50    Types: Cigarettes   Smokeless tobacco: Never  Vaping Use   Vaping Use: Never used  Substance and Sexual Activity   Alcohol use: Yes    Comment: occ   Drug use: No   Sexual activity: Not on file  Other Topics Concern   Not on file  Social History Narrative   Not on file   Social Determinants of Health    Financial Resource Strain: Not on file  Food Insecurity: Not on file  Transportation Needs: Not on file  Physical Activity: Not on file  Stress: Not on file  Social Connections: Not on file  Intimate Partner Violence: Not on file     Family History  Problem Relation Age of Onset   Hypertension Mother    Diabetes Mother    CVA Father    Hypertension Father    Diabetes Father    Pancreatic cancer Father    Pulmonary embolism Father    Bone cancer Maternal Aunt    Diabetes Paternal Grandmother     Review of Systems  Constitutional:  Constitutional negative. HENT: HENT negative.  Eyes: Eyes negative.  Cardiovascular: Positive for leg swelling.  GI: Gastrointestinal negative.  Skin: Positive for wound.  Neurological: Neurological negative. Hematologic: Hematologic/lymphatic negative.  Psychiatric: Psychiatric negative.       Physical Examination  Vitals:   12/06/21 0600  BP: 119/76  Pulse: 74  Resp: 18  Temp: 98.2 F (36.8 C)  SpO2: 96%   Body mass index is 33.81 kg/m.  Physical Exam HENT:     Head: Normocephalic.     Nose: Nose normal.  Eyes:     Pupils: Pupils are equal, round, and reactive to light.  Neck:     Vascular: No carotid bruit.  Cardiovascular:     Pulses:          Femoral pulses are 2+ on the right side and 0 on the left side.      Popliteal pulses are 0 on the right side and 0 on the left side.       Dorsalis pedis pulses are 0 on the right side and 0 on the left side.  Abdominal:     General: Abdomen is flat.     Palpations: There is no mass.  Musculoskeletal:     Cervical back: Normal range of motion and neck supple.  Skin:    Capillary Refill: Capillary refill takes more than 3 seconds.     Comments: There is a 2-1/2 cm wound on the left posterior calf with central eschar  Neurological:     General: No focal deficit present.     Mental Status: She is alert.   CBC    Component Value Date/Time   WBC 7.0 12/02/2021 1332    RBC 4.89 12/02/2021 1332   HGB 13.6 12/02/2021 1332   HCT 41.3 12/02/2021 1332   PLT 366 12/02/2021 1332   MCV 84.5 12/02/2021 1332   MCH 27.8 12/02/2021 1332   MCHC 32.9 12/02/2021 1332   RDW 15.1 12/02/2021 1332   LYMPHSABS 1.5 07/11/2007 0525   MONOABS 0.8 07/11/2007 0525   EOSABS 0.4 07/11/2007 0525   BASOSABS 0.0  07/11/2007 0525    BMET    Component Value Date/Time   NA 135 12/02/2021 1332   K 3.4 (L) 12/02/2021 1332   CL 95 (L) 12/02/2021 1332   CO2 29 12/02/2021 1332   GLUCOSE 141 (H) 12/02/2021 1332   BUN 28 (H) 12/02/2021 1332   CREATININE 1.76 (H) 12/02/2021 1332   CALCIUM 10.0 12/02/2021 1332   GFRNONAA 32 (L) 12/02/2021 1332   GFRAA  07/10/2007 0515    >60        The eGFR has been calculated using the MDRD equation. This calculation has not been validated in all clinical    COAGS: Lab Results  Component Value Date   INR 1.1 12/02/2021    ASSESSMENT/PLAN: 66 year old female with left lower extremity wound with concern for multiple level disease without readily palpable left common femoral pulse although the right is readily palpable.  Plan is left common femoral endarterectomy with left femoral to above or below knee bypass likely with graft.     Dandy Lazaro C. Donzetta Matters, MD [facility] Office: [phone] Pager: [phone]

## 2021-12-06 NOTE — Progress Notes (Signed)
   12/06/21 2315  Assess: MEWS Score  Temp (!) 101.6 F (38.7 C)  BP 139/73  MAP (mmHg) 90  Pulse Rate (!) 102  ECG Heart Rate (!) 104  Resp (!) 22  Level of Consciousness Alert  SpO2 100 %  O2 Device Room Air  Assess: MEWS Score  MEWS Temp 2  MEWS Systolic 0  MEWS Pulse 1  MEWS RR 1  MEWS LOC 0  MEWS Score 4  MEWS Score Color Red  Assess: if the MEWS score is Yellow or Red  Were vital signs taken at a resting state? Yes  Focused Assessment No change from prior assessment  Does the patient meet 2 or more of the SIRS criteria? Yes  Does the patient have a confirmed or suspected source of infection? No  MEWS guidelines implemented *See Row Information* Yes  Treat  Pain Scale 0-10  Pain Score Asleep  Take Vital Signs  Increase Vital Sign Frequency  Red: Q 1hr X 4 then Q 4hr X 4, if remains red, continue Q 4hrs  Escalate  MEWS: Escalate Red: discuss with charge nurse/RN and provider, consider discussing with RRT  Notify: Charge Nurse/RN  Name of Charge Nurse/RN Notified Erionna,RN  Date Charge Nurse/RN Notified 12/06/21  Time Charge Nurse/RN Notified 2322  Notify: Provider  Provider Name/Title Dickson,MD  Date Provider Notified 12/06/21  Time Provider Notified 2332  Method of Notification Call  Notification Reason Critical result  Provider response Evaluate remotely  Date of Provider Response 12/06/21  Time of Provider Response 2335  Document  Patient Outcome Other (Comment)  Progress note created (see row info) Yes  Assess: SIRS CRITERIA  SIRS Temperature  1  SIRS Pulse 1  SIRS Respirations  1  SIRS WBC 1  SIRS Score Sum  4   PRN medication administered. RN will continue to monitor pt.

## 2021-12-06 NOTE — Anesthesia Procedure Notes (Signed)
Arterial Line Insertion Start/End8/11/2021 7:15 AM, 12/06/2021 7:20 AM Performed by: Maude Leriche, CRNA, CRNA  Patient location: Pre-op. Preanesthetic checklist: patient identified, IV checked, site marked, risks and benefits discussed, surgical consent, monitors and equipment checked, pre-op evaluation, timeout performed and anesthesia consent Lidocaine 1% used for infiltration Right, radial was placed Catheter size: 20 G Hand hygiene performed  and maximum sterile barriers used  Allen's test indicative of satisfactory collateral circulation Attempts: 1 Procedure performed using ultrasound guided technique. Ultrasound Notes:anatomy identified Following insertion, Biopatch. Post procedure assessment: unchanged  Patient tolerated the procedure well with no immediate complications.

## 2021-12-06 NOTE — Op Note (Signed)
Patient name: [name] Houston MRN: [id number] DOB: Jan 15, 1956 Sex: female  12/06/2021 Pre-operative Diagnosis: Chronic left lower extremity limb threatening ischemia with ulceration of her left calf Post-operative diagnosis:  Same Surgeon:  Eda Paschal. Donzetta Matters, MD Assistant: Arlee Muslim, PA Procedure Performed: 1.  Extensive left common femoral, external iliac, profundofemoral and SFA endarterectomy with ligation of proximal left SFA and vein patch angioplasty of left common femoral artery 2.  Harvest of left greater saphenous vein in the mid thigh 3.  Left common femoral to above-knee popliteal artery bypass with 6 mm ringed PTFE   Indications: 66 year old female presented with ongoing left calf ulceration that has failed to heal with ABI of 0.5 range and nonpalpable left common femoral pulse.  She underwent angiography which demonstrated extensive left common femoral disease with occlusion of her SFA reconstitution of above-knee popliteal artery with three-vessel runoff to the foot.  She is now indicated for endarterectomy with bypass.  Findings: Common femoral artery was subtotally occluded with extensive calcification extending into both the profunda and the SFA and up under the inguinal ligament the pulse was not present for approximately 4 cm we were able to actually clamped the vessel.  After extensive endarterectomy the SFA was very thin and I elected to ligate this proximally as it was incredibly diseased.  I then performed patch angioplasty with a vein patch on the common femoral artery and there is a very strong signal in the profunda.  Above the knee the popliteal artery was somewhat calcified but there was 1 soft area for clamping and at the end of bypass there was signal in the anterior tibial and posterior tibial arteries at the ankle that were graft dependent.   Procedure:  The patient was identified in the holding area and taken to the operating room where she was placed upon  operative when general anesthesia was induced.  She was sterilely prepped and draped in the left lower extremity usual fashion, antibiotics were minister timeout was called.  Ultrasound was used to identify what did not appear to be a suitable saphenous vein but there was suitable vein for patch particularly in the mid thigh where it was easily accessible.  I also identified the common femoral artery which appeared subtotally occluded.  Longitudinal incision was made over the common femoral artery we dissected down to this dissected up under the inguinal ligament where there was heavy calcification there was really no pulse for several centimeters we were able to encircle the vessel higher up than this with extensive exposure where thought that we could get a clamp.  We then dissected further down to the SFA and profunda which were also heavily calcified had no pulsatility and placed Vesseloops around these.  I then turned my attention distally above the knee medial vertical incision dissected down identified the femoral vein and superficial femoral artery and tracking down to the knee the popliteal artery and veins.  There was one soft area in the above-knee popliteal artery amenable for clamping and marked this.  Through a counterincision I then identified the saphenous vein and harvested this for approximately 15 cm tying it off on both ends.  Patient was then fully heparinized after tunneling a 6 mm ringed PTFE between the groin and above-knee incision and she maintained therapeutic ACT throughout the case.  We then clamped the profunda and SFA although these really did not clamp given the heavy calcification followed by the external iliac artery under the inguinal ligament.  We opened  the vessel longitudinally performed extensive endarterectomy until we had very good inflow that was pulsatile and soft area for sewing.  Distally the profunda had very good backbleeding there was an injury to this that I repaired  with a 6 oh pledgeted suture unfortunately the SFA was incredibly thinned out and I elected to ligate this and perform a stump patch angioplasty of the common femoral artery into the profunda.  I then spatulated the harvested greater saphenous vein and reversed this and sewed into place as a patch with 5-0 Prolene suture.  Upon completion we then flushed and thoroughly irrigated with heparinized saline and we had a very strong signal in the profunda.  I then reclamped and made a vertical arteriotomy in the patch angioplasty and trimmed the graft to size and sewed in place with 5-0 Prolene suture.  Upon completion we then flushed through the graft itself and this was reclamped.  We turned our attention distally.  The popliteal artery was clamped distally proximally and opened longitudinally there was good backbleeding from the distal.  The graft was then straightened trimmed to size and spatulated and sewn into side with 6-0 Prolene suture.  Prior completion without flushing all directions.  1 placed there is very good pulsatility in the graft.  We had good signal distally within the wound and at the anterior tibial and posterior tibial at the ankle that were graft dependent.  50 mg of protamine was administered.  We obtained hemostasis of the wounds and closed in layers with Vicryl and Monocryl.  Dermabond was placed at the skin level.  She was awakened from anesthesia having tolerated procedure without any complication.  All counts were correct at completion.   Given the complexity of the case,  the assistant was necessary in order to expedient the procedure and safely perform the technical aspects of the operation.  The assistant provided traction and countertraction to assist with exposure of the artery proximally and distally.  They assisted with suture ligature of multiple branches.  Their assistance was critical in the performance endarterectomy of the common femoral artery and of both the proximal and  distal anastomosis.These skills, especially following the Prolene suture for the anastomosis, could not have been adequately performed by a scrub tech assistant.    EBL: 600cc  Dominico Rod C. Donzetta Matters, MD [facility] [place] Office: [phone] Pager: [phone]

## 2021-12-06 NOTE — Transfer of Care (Signed)
Immediate Anesthesia Transfer of Care Note  Patient: [name] Houston  Procedure(s) Performed: LEFT COMMON FEMORAL ENDARTERECTOMY (Left) LEFT COMMON FEMORAL-BELOW KNEE POPLITEAL ARTERY BYPASS (Left)  Patient Location: PACU  Anesthesia Type:General  Level of Consciousness: awake, alert  and oriented  Airway & Oxygen Therapy: Patient Spontanous Breathing  Post-op Assessment: Report given to RN, Post -op Vital signs reviewed and stable, Patient moving all extremities X 4 and Patient able to stick tongue midline  Post vital signs: Reviewed  Last Vitals:  Vitals Value Taken Time  BP 138/97 12/06/21 1124  Temp 97.6   Pulse 92 12/06/21 1126  Resp 22 12/06/21 1126  SpO2 95 % 12/06/21 1126  Vitals shown include unvalidated device data.  Last Pain:  Vitals:   12/06/21 0631  TempSrc:   PainSc: 0-No pain         Complications: No notable events documented.

## 2021-12-06 NOTE — Progress Notes (Signed)
Patient arrived to 4E from PACU. Vitals taken and stable. Tele placed and CCMD notified. Patient oriented to unit and staff. CHG completed. Call bell within reach and family at the bedside.  [name] Houston

## 2021-12-06 NOTE — Anesthesia Procedure Notes (Signed)
Procedure Name: Intubation Date/Time: 12/06/2021 7:55 AM  Performed by: Maude Leriche, CRNAPre-anesthesia Checklist: Patient identified, Emergency Drugs available, Suction available and Patient being monitored Patient Re-evaluated:Patient Re-evaluated prior to induction Oxygen Delivery Method: Circle system utilized Preoxygenation: Pre-oxygenation with 100% oxygen Induction Type: IV induction Ventilation: Mask ventilation without difficulty Laryngoscope Size: Miller and 2 Grade View: Grade I Tube type: Oral Tube size: 7.0 mm Number of attempts: 1 Placement Confirmation: ETT inserted through vocal cords under direct vision, positive ETCO2 and breath sounds checked- equal and bilateral Secured at: 21 cm Tube secured with: Tape Dental Injury: Teeth and Oropharynx as per pre-operative assessment

## 2021-12-07 ENCOUNTER — Encounter (HOSPITAL_COMMUNITY): Payer: Self-pay | Admitting: Vascular Surgery

## 2021-12-07 LAB — BASIC METABOLIC PANEL WITH GFR
Anion gap: 12 (ref 5–15)
BUN: 20 mg/dL (ref 8–23)
CO2: 23 mmol/L (ref 22–32)
Calcium: 8.7 mg/dL — ABNORMAL LOW (ref 8.9–10.3)
Chloride: 103 mmol/L (ref 98–111)
Creatinine, Ser: 1.27 mg/dL — ABNORMAL HIGH (ref 0.44–1.00)
GFR, Estimated: 47 mL/min — ABNORMAL LOW
Glucose, Bld: 138 mg/dL — ABNORMAL HIGH (ref 70–99)
Potassium: 3.4 mmol/L — ABNORMAL LOW (ref 3.5–5.1)
Sodium: 138 mmol/L (ref 135–145)

## 2021-12-07 LAB — CBC
HCT: 30 % — ABNORMAL LOW (ref 36.0–46.0)
Hemoglobin: 9.9 g/dL — ABNORMAL LOW (ref 12.0–15.0)
MCH: 27.6 pg (ref 26.0–34.0)
MCHC: 33 g/dL (ref 30.0–36.0)
MCV: 83.6 fL (ref 80.0–100.0)
Platelets: 264 K/uL (ref 150–400)
RBC: 3.59 MIL/uL — ABNORMAL LOW (ref 3.87–5.11)
RDW: 15.2 % (ref 11.5–15.5)
WBC: 9.5 K/uL (ref 4.0–10.5)
nRBC: 0 % (ref 0.0–0.2)

## 2021-12-07 LAB — GLUCOSE, CAPILLARY
Glucose-Capillary: 120 mg/dL — ABNORMAL HIGH (ref 70–99)
Glucose-Capillary: 202 mg/dL — ABNORMAL HIGH (ref 70–99)
Glucose-Capillary: 203 mg/dL — ABNORMAL HIGH (ref 70–99)
Glucose-Capillary: 185 mg/dL — ABNORMAL HIGH (ref 70–99)

## 2021-12-07 NOTE — Progress Notes (Addendum)
  Progress Note    12/07/2021 6:49 AM 1 Day Post-Op  Subjective:  says she's having some pain.  Had some sharp shooting pain in her foot overnight.  Tm 101.6 now 99 HR 80's-100's  559'R-416'L systolic 845% RA  Vitals:   12/07/21 0330 12/07/21 0400  BP: 126/73 128/74  Pulse: 86   Resp: 19 18  Temp: 99 F (37.2 C)   SpO2: 100%     Physical Exam: Cardiac:  regular Lungs:  non labored Incisions:  left groin and AK incisions look good Extremities:  brisk multiphasic left DP/PT doppler signals;  calf soft and non tender Abdomen:  soft  CBC    Component Value Date/Time   WBC 9.5 12/07/2021 0559   RBC 3.59 (L) 12/07/2021 0559   HGB 9.9 (L) 12/07/2021 0559   HCT 30.0 (L) 12/07/2021 0559   PLT 264 12/07/2021 0559   MCV 83.6 12/07/2021 0559   MCH 27.6 12/07/2021 0559   MCHC 33.0 12/07/2021 0559   RDW 15.2 12/07/2021 0559   LYMPHSABS 1.5 07/11/2007 0525   MONOABS 0.8 07/11/2007 0525   EOSABS 0.4 07/11/2007 0525   BASOSABS 0.0 07/11/2007 0525    BMET    Component Value Date/Time   NA 138 12/07/2021 0559   K 3.4 (L) 12/07/2021 0559   CL 103 12/07/2021 0559   CO2 23 12/07/2021 0559   GLUCOSE 138 (H) 12/07/2021 0559   BUN 20 12/07/2021 0559   CREATININE 1.27 (H) 12/07/2021 0559   CALCIUM 8.7 (L) 12/07/2021 0559   GFRNONAA 47 (L) 12/07/2021 0559   GFRAA  07/10/2007 0515    >60        The eGFR has been calculated using the MDRD equation. This calculation has not been validated in all clinical    INR    Component Value Date/Time   INR 1.1 12/02/2021 1332     Intake/Output Summary (Last 24 hours) at 12/07/2021 0649 Last data filed at 12/06/2021 1744 Gross per 24 hour  Intake 2229.13 ml  Output 1700 ml  Net 529.13 ml     Assessment/Plan:  66 y.o. female is s/p:  1.  Extensive left common femoral, external iliac, profundofemoral and SFA endarterectomy with ligation of proximal left SFA and vein patch angioplasty of left common femoral artery 2.  Harvest of  left greater saphenous vein in the mid thigh 3.  Left common femoral to above-knee popliteal artery bypass with 6 mm ringed PTFE  1 Day Post-Op   -pt with brisk multiphasic left DP/PT doppler signals -acute surgical blood loss anemia-pt tolerating -creatinine improved to 1.27 from 1.76 on 12/02/2021 -DVT prophylaxis:  sq heparin -discussed groin wound care with pt.  Continue dry gauze to groin to wick moisture.   -mobilize out of bed today.   Leontine Locket, PA-C [facility] [phone] 12/07/2021 6:49 AM  I have independently interviewed and examined patient and agree with PA assessment and plan above.   Abisola Carrero C. Donzetta Matters, MD [facility] [place] Office: [phone] Pager: [phone]

## 2021-12-07 NOTE — Progress Notes (Signed)
Mobility Specialist: Progress Note   12/07/21 1535  Mobility  Activity Ambulated with assistance in hallway  Level of Assistance Moderate assist, patient does 50-74%  Assistive Device Front wheel walker  Distance Ambulated (ft) 20 ft  Activity Response Tolerated fair  $Mobility charge 1 Mobility   Pre-Mobility: 107 HR During Mobility: 130 HR Post-Mobility: 114 HR, 141/52 (77) BP, 99% SpO2  Pt received in the chair and agreeable to mobility. Requesting to use BSC, void successful. Pt required modA to stand and contact guard during ambulation. C/o 9/10 LLE pain as well as mild dizziness. Pt to the bed after session per request with call bell and phone at her side.   [facility] [name] Houston Mobility Specialist Mobility Specialist [address] [facility]: [phone]

## 2021-12-07 NOTE — Evaluation (Signed)
Occupational Therapy Evaluation Patient Details Name: [name] Houston MRN: [id number] DOB: 03/06/1956 Today's Date: 12/07/2021   History of Present Illness Pt is 66 yo female who presented on 8/8 with slow healing wound L calf and underwent L common femoral endarterectomy with L FPBG. PMH: DMII, GERD, HLD, HTN, kidney disease, smoker.   Clinical Impression   Pt PTA: Pt living with spouse who is currently hospitalized at [facility], but per patient will be discharged soon. Pt reports she was independent with ADL and mobility prior to this. Pt currently, limited by decreased activity tolerance, decreased ability to care for self and increased pain in LLE. Pt standing at commode and totalA for pericare at this time d/t weakness and instability with balance with use of RW. Pt limited by pain for session, but motivated to return to PLOF. Pt would benefit from continued OT skilled services. OT following acutely. VSS on RA.      Recommendations for follow up therapy are one component of a multi-disciplinary discharge planning process, led by the attending physician.  Recommendations may be updated based on patient status, additional functional criteria and insurance authorization.   Follow Up Recommendations  Home health OT    Assistance Recommended at Discharge Frequent or constant Supervision/Assistance  Patient can return home with the following A little help with walking and/or transfers;A lot of help with bathing/dressing/bathroom;Assist for transportation;Help with stairs or ramp for entrance    Functional Status Assessment  Patient has had a recent decline in their functional status and demonstrates the ability to make significant improvements in function in a reasonable and predictable amount of time.  Equipment Recommendations  BSC/3in1    Recommendations for Other Services       Precautions / Restrictions Precautions Precautions: None Restrictions Weight Bearing Restrictions: No       Mobility Bed Mobility               General bed mobility comments: at EOB and transferred to recliner    Transfers Overall transfer level: Needs assistance Equipment used: Rolling walker (2 wheels) Transfers: Sit to/from Stand, Bed to chair/wheelchair/BSC Sit to Stand: Min assist     Step pivot transfers: Min assist, From elevated surface     General transfer comment: MinG to minA overall for pwer-up and stability of initial stance      Balance Overall balance assessment: Needs assistance Sitting-balance support: Feet supported, Bilateral upper extremity supported Sitting balance-Leahy Scale: Fair     Standing balance support: Bilateral upper extremity supported Standing balance-Leahy Scale: Poor Standing balance comment: heavy reliance on BUE support on RW                           ADL either performed or assessed with clinical judgement   ADL Overall ADL's : Needs assistance/impaired Eating/Feeding: Set up;Sitting   Grooming: Set up;Sitting   Upper Body Bathing: Set up;Sitting   Lower Body Bathing: Moderate assistance;Sit to/from stand;Sitting/lateral leans   Upper Body Dressing : Set up;Sitting   Lower Body Dressing: Moderate assistance;Sitting/lateral leans;Sit to/from stand   Toilet Transfer: Minimal assistance;Stand-pivot;Cueing for safety;Cueing for sequencing;BSC/3in1   Toileting- Clothing Manipulation and Hygiene: Total assistance;Cueing for safety;Sit to/from stand       Functional mobility during ADLs: Minimal assistance;Cueing for safety;Cueing for sequencing;Rolling walker (2 wheels) General ADL Comments: Pt limited by decreased activity tolerance, decreased ability to care for self and increased pain in LLE. Pt standing at commode and totalA for pericare  at this time d/t weakness and instability with balance with use of RW.     Vision Baseline Vision/History: 1 Wears glasses Ability to See in Adequate Light: 0  Adequate Patient Visual Report: No change from baseline Vision Assessment?: No apparent visual deficits     Perception     Praxis      Pertinent Vitals/Pain Pain Assessment Pain Assessment: 0-10 Pain Score: 6  Pain Location: LLE Pain Descriptors / Indicators: Grimacing, Guarding, Sore, Sharp Pain Intervention(s): Monitored during session, Repositioned, Premedicated before session     Hand Dominance Right   Extremity/Trunk Assessment Upper Extremity Assessment Upper Extremity Assessment: Overall WFL for tasks assessed   Lower Extremity Assessment Lower Extremity Assessment: LLE deficits/detail LLE Deficits / Details: Pain at incision LLE: Unable to fully assess due to pain   Cervical / Trunk Assessment Cervical / Trunk Assessment: Normal   Communication Communication Communication: No difficulties   Cognition Arousal/Alertness: Awake/alert Behavior During Therapy: WFL for tasks assessed/performed Overall Cognitive Status: Within Functional Limits for tasks assessed                                       General Comments  swelling present    Exercises     Shoulder Instructions      Home Living Family/patient expects to be discharged to:: Private residence Living Arrangements: Spouse/significant other Available Help at Discharge: Family;Available 24 hours/day Type of Home: House Home Access: Stairs to enter [employer] of Steps: 4 Entrance Stairs-Rails: Right Home Layout: One level               Home Equipment: Cane - single point   Additional Comments: Pt's husband currently in hospital in [place], he is a liver transplant pt. Sister from [place] here temporarily. Per pt, Husband will be discharged soon.      Prior Functioning/Environment Prior Level of Function : Independent/Modified Independent             Mobility Comments: pt has been mobilizing independently with SPC but reports she broke her R ankle  recently has not driven since then. Her husband drives ADLs Comments: independent        OT Problem List: Decreased activity tolerance;Impaired balance (sitting and/or standing);Decreased safety awareness;Decreased strength;Increased edema;Pain      OT Treatment/Interventions: Self-care/ADL training;Therapeutic exercise;Energy conservation;DME and/or AE instruction;Therapeutic activities;Patient/family education;Balance training    OT Goals(Current goals can be found in the care plan section) Acute Rehab OT Goals Patient Stated Goal: to go home OT Goal Formulation: With patient Time For Goal Achievement: 12/21/21 Potential to Achieve Goals: Good ADL Goals Pt Will Transfer to Toilet: with supervision;ambulating;bedside commode Pt Will Perform Toileting - Clothing Manipulation and hygiene: with min assist;sit to/from stand Additional ADL Goal #1: Pt will increase to x5 mins of OOB ADL tasks in sitting or standing with minA overall. Additional ADL Goal #2: Pt will verbalize 3 energy conservation techniques to incraese activity tolerance.  OT Frequency: Min 2X/week    Co-evaluation              AM-PAC OT "6 Clicks" Daily Activity     Outcome Measure Help from another person eating meals?: None Help from another person taking care of personal grooming?: A Little Help from another person toileting, which includes using toliet, bedpan, or urinal?: Total Help from another person bathing (including washing, rinsing, drying)?: A Lot Help from another person to  put on and taking off regular upper body clothing?: A Little Help from another person to put on and taking off regular lower body clothing?: A Lot 6 Click Score: 15   End of Session Equipment Utilized During Treatment: Gait belt;Rolling walker (2 wheels) Nurse Communication: Mobility status  Activity Tolerance: Patient tolerated treatment well;Patient limited by pain Patient left: in chair;with call bell/phone within  reach;with chair alarm set  OT Visit Diagnosis: Unsteadiness on feet (R26.81);Muscle weakness (generalized) (M62.81);Pain Pain - Right/Left: Left Pain - part of body: Leg;Ankle and joints of foot                Time: 1194-1740 OT Time Calculation (min): 38 min Charges:  OT General Charges $OT Visit: 1 Visit OT Evaluation $OT Eval Moderate Complexity: 1 Mod OT Treatments $Self Care/Home Management : 8-22 mins $Therapeutic Activity: 8-22 mins Jefferey Pica, OTR/L Acute Rehabilitation Services Office: [phone]   Anamaria Dusenbury C 12/07/2021, 10:49 AM

## 2021-12-07 NOTE — Plan of Care (Signed)
  Problem: Education: Goal: Ability to describe self-care measures that may prevent or decrease complications (Diabetes Survival Skills Education) will improve Outcome: Progressing   Problem: Coping: Goal: Ability to adjust to condition or change in health will improve Outcome: Progressing   Problem: Fluid Volume: Goal: Ability to maintain a balanced intake and output will improve Outcome: Progressing   Problem: Skin Integrity: Goal: Risk for impaired skin integrity will decrease Outcome: Progressing

## 2021-12-07 NOTE — Progress Notes (Signed)
Physical Therapy Treatment Patient Details Name: [name] Houston MRN: [id number] DOB: 1955-11-12 Today's Date: 12/07/2021   History of Present Illness Pt is 66 yo female who presented on 8/8 with slow healing wound L calf and underwent L common femoral endarterectomy with L FPBG. PMH: DMII, GERD, HLD, HTN, kidney disease, smoker.    PT Comments    Pt received supine and agreeable to session with encouragement, however pt continues to be limited by LLE pain despite premedication. Pt needing substantially increased time to complete all bed mobility with min assist to manage LLE and to fully scoot to EOB. Pt able to come to standing with min assist to power up to RW and mod assist to pivot to [facility]. OT present at end of session for handoff. Plan to advance OOB mobility as pain decreases. Pt continues to benefit from skilled PT services to progress toward functional mobility goals.    Recommendations for follow up therapy are one component of a multi-disciplinary discharge planning process, led by the attending physician.  Recommendations may be updated based on patient status, additional functional criteria and insurance authorization.  Follow Up Recommendations  Home health PT     Assistance Recommended at Discharge Intermittent Supervision/Assistance  Patient can return home with the following A little help with walking and/or transfers;A little help with bathing/dressing/bathroom;Assistance with cooking/housework;Assist for transportation;Help with stairs or ramp for entrance   Equipment Recommendations  Rolling walker (2 wheels)    Recommendations for Other Services      Precautions / Restrictions Precautions Precautions: None Restrictions Weight Bearing Restrictions: No     Mobility  Bed Mobility Overal bed mobility: Needs Assistance Bed Mobility: Supine to Sit     Supine to sit: Min assist     General bed mobility comments: min a to walk LEs to and off EOB and lower  LLE to floor, pt able to elevate trunk with use of bedrails    Transfers Overall transfer level: Needs assistance Equipment used: Rolling walker (2 wheels) Transfers: Sit to/from Stand, Bed to chair/wheelchair/BSC Sit to Stand: Min assist Stand pivot transfers: Mod assist         General transfer comment: min assist to power up to stand, cues needed for hand placement, mod assist to manage RW and for step by step cues to pivot on RLE to [facility]    Ambulation/Gait               General Gait Details: unable secondary to pain and need for [facility]   Stairs             Wheelchair Mobility    Modified Rankin (Stroke Patients Only)       Balance Overall balance assessment: Needs assistance Sitting-balance support: Feet supported, Bilateral upper extremity supported Sitting balance-Leahy Scale: Fair     Standing balance support: Bilateral upper extremity supported Standing balance-Leahy Scale: Poor Standing balance comment: heavy reliance on BUE support on RW                            Cognition Arousal/Alertness: Awake/alert Behavior During Therapy: WFL for tasks assessed/performed Overall Cognitive Status: Within Functional Limits for tasks assessed                                          Exercises      General Comments  General comments (skin integrity, edema, etc.): handoff to OT at end of session, further ambulation and exercises deferred secondary to pain and need for [facility] use.      Pertinent Vitals/Pain Pain Assessment Pain Assessment: Faces Faces Pain Scale: Hurts even more Pain Location: LLE Pain Descriptors / Indicators: Grimacing, Guarding, Sore, Sharp Pain Intervention(s): Premedicated before session, Monitored during session, Limited activity within patient's tolerance    Home Living                          Prior Function            PT Goals (current goals can now be found in the care plan section)  Acute Rehab PT Goals Patient Stated Goal: return home PT Goal Formulation: With patient Time For Goal Achievement: 12/20/21    Frequency    Min 3X/week      PT Plan      Co-evaluation              AM-PAC PT "6 Clicks" Mobility   Outcome Measure  Help needed turning from your back to your side while in a flat bed without using bedrails?: A Lot Help needed moving from lying on your back to sitting on the side of a flat bed without using bedrails?: A Lot Help needed moving to and from a bed to a chair (including a wheelchair)?: A Lot Help needed standing up from a chair using your arms (e.g., wheelchair or bedside chair)?: A Lot Help needed to walk in hospital room?: Total Help needed climbing 3-5 steps with a railing? : Total 6 Click Score: 10    End of Session   Activity Tolerance: Patient limited by pain Patient left: in chair;with call bell/phone within reach (on [facility] with OT) Nurse Communication: Mobility status PT Visit Diagnosis: Pain;Difficulty in walking, not elsewhere classified (R26.2) Pain - Right/Left: Left Pain - part of body: Leg     Time: 0981-1914 PT Time Calculation (min) (ACUTE ONLY): 19 min  Charges:  $Therapeutic Activity: 8-22 mins                     Daelon Dunivan R. PTA Acute Rehabilitation Services Office: [place] 12/07/2021, 9:30 AM

## 2021-12-08 LAB — GLUCOSE, CAPILLARY
Glucose-Capillary: 150 mg/dL — ABNORMAL HIGH (ref 70–99)
Glucose-Capillary: 173 mg/dL — ABNORMAL HIGH (ref 70–99)
Glucose-Capillary: 263 mg/dL — ABNORMAL HIGH (ref 70–99)
Glucose-Capillary: 154 mg/dL — ABNORMAL HIGH (ref 70–99)

## 2021-12-08 NOTE — Progress Notes (Signed)
Physical Therapy Treatment Patient Details Name: [name] Houston MRN: [id number] DOB: Nov 30, 1955 Today's Date: 12/08/2021   History of Present Illness Pt is 66 yo female who presented on 8/8 with slow healing wound L calf and underwent L common femoral endarterectomy with L FPBG. PMH: DMII, GERD, HLD, HTN, kidney disease, smoker.    PT Comments    Pt received OOB in recliner and agreeable to session with continued progress. Session focused on increased ambulation tolerance and gait mechanics for increased safety and activity tolerance. Pt demonstrating ambulation with up to min assist for cueing with RW and no overt LOB, distance limited secondary to LLE pain. Pt with fair tolerance for LE therex for increased ROM and strength. Pt continues to benefit from skilled PT services to progress toward functional mobility goals.    Recommendations for follow up therapy are one component of a multi-disciplinary discharge planning process, led by the attending physician.  Recommendations may be updated based on patient status, additional functional criteria and insurance authorization.  Follow Up Recommendations  Home health PT     Assistance Recommended at Discharge Intermittent Supervision/Assistance  Patient can return home with the following A little help with walking and/or transfers;A little help with bathing/dressing/bathroom;Assistance with cooking/housework;Assist for transportation;Help with stairs or ramp for entrance   Equipment Recommendations  Rolling walker (2 wheels)    Recommendations for Other Services       Precautions / Restrictions Precautions Precautions: Fall Restrictions Weight Bearing Restrictions: No     Mobility  Bed Mobility Overal bed mobility: Needs Assistance             General bed mobility comments: pt OOB in recliner pre and post session    Transfers Overall transfer level: Needs assistance Equipment used: Rolling walker (2  wheels) Transfers: Sit to/from Stand, Bed to chair/wheelchair/BSC Sit to Stand: Min assist           General transfer comment: light min assist to power up from recliner and BSC    Ambulation/Gait Ambulation/Gait assistance: Min guard, Min assist Gait Distance (Feet): 35 Feet Assistive device: Rolling walker (2 wheels) Gait Pattern/deviations: Step-to pattern, Decreased stance time - left, Decreased stride length, Antalgic Gait velocity: decr     General Gait Details: slow antalgic step to gait, wide BOS with LLE circumduction, tatctile cues to bring LLE towards center of stance, light cues at start for sequencing   Stairs             Wheelchair Mobility    Modified Rankin (Stroke Patients Only)       Balance Overall balance assessment: Needs assistance Sitting-balance support: Feet supported Sitting balance-Leahy Scale: Fair     Standing balance support: Bilateral upper extremity supported Standing balance-Leahy Scale: Poor Standing balance comment: heavy reliance on BUE support on RW                            Cognition Arousal/Alertness: Awake/alert Behavior During Therapy: WFL for tasks assessed/performed Overall Cognitive Status: Within Functional Limits for tasks assessed                                          Exercises General Exercises - Lower Extremity Long Arc Quad: AROM, Left, 10 reps, Seated Heel Raises: AROM, Left, 10 reps, Seated    General Comments  Pertinent Vitals/Pain Pain Assessment Pain Assessment: Faces Faces Pain Scale: Hurts even more Pain Location: LLE Pain Descriptors / Indicators: Grimacing, Guarding, Sore, Sharp Pain Intervention(s): Monitored during session, Limited activity within patient's tolerance    Home Living                          Prior Function            PT Goals (current goals can now be found in the care plan section) Acute Rehab PT Goals Patient  Stated Goal: return home PT Goal Formulation: With patient Time For Goal Achievement: 12/20/21    Frequency    Min 3X/week      PT Plan      Co-evaluation              AM-PAC PT "6 Clicks" Mobility   Outcome Measure  Help needed turning from your back to your side while in a flat bed without using bedrails?: A Lot Help needed moving from lying on your back to sitting on the side of a flat bed without using bedrails?: A Lot Help needed moving to and from a bed to a chair (including a wheelchair)?: A Lot Help needed standing up from a chair using your arms (e.g., wheelchair or bedside chair)?: A Lot Help needed to walk in hospital room?: A Lot Help needed climbing 3-5 steps with a railing? : Total 6 Click Score: 11    End of Session Equipment Utilized During Treatment: Gait belt Activity Tolerance: Patient tolerated treatment well;Patient limited by pain Patient left: in chair;with call bell/phone within reach Nurse Communication: Mobility status PT Visit Diagnosis: Pain;Difficulty in walking, not elsewhere classified (R26.2) Pain - Right/Left: Left Pain - part of body: Leg     Time: 1206-1232 PT Time Calculation (min) (ACUTE ONLY): 26 min  Charges:  $Gait Training: 8-22 mins $Therapeutic Exercise: 8-22 mins                     [name] Amadon R. PTA Acute Rehabilitation Services Office: [place] 12/08/2021, 12:47 PM

## 2021-12-08 NOTE — Progress Notes (Signed)
Occupational Therapy Treatment Patient Details Name: [name] Houston MRN: [id number] DOB: [birth year] Today's Date: 12/08/2021   History of present illness Pt is 66 yo female who presented on 8/8 with slow healing wound L calf and underwent L common femoral endarterectomy with L FPBG. PMH: DMII, GERD, HLD, HTN, kidney disease, smoker.   OT comments  Patient continues with pain to her Left leg, but is willing to participate.  Able to transition to the [facility] with Min A at RW level, and ADL completed from a sit to stand level.  Setup for upper body, and Mod A for lower body ADL.  Continue efforts in the acute setting, with [facility] OT recommended for post acute rehab.     Recommendations for follow up therapy are one component of a multi-disciplinary discharge planning process, led by the attending physician.  Recommendations may be updated based on patient status, additional functional criteria and insurance authorization.    Follow Up Recommendations  Home health OT    Assistance Recommended at Discharge Frequent or constant Supervision/Assistance  Patient can return home with the following  A little help with walking and/or transfers;A lot of help with bathing/dressing/bathroom;Assist for transportation;Help with stairs or ramp for entrance   Equipment Recommendations  BSC/3in1    Recommendations for Other Services      Precautions / Restrictions Precautions Precautions: Fall Restrictions Weight Bearing Restrictions: No       Mobility Bed Mobility Overal bed mobility: Needs Assistance Bed Mobility: Supine to Sit     Supine to sit: Min assist          Transfers Overall transfer level: Needs assistance Equipment used: Rolling walker (2 wheels) Transfers: Sit to/from Stand, Bed to chair/wheelchair/BSC Sit to Stand: Min assist     Step pivot transfers: Min assist           Balance Overall balance assessment: Needs assistance Sitting-balance support: Feet  supported Sitting balance-Leahy Scale: Fair     Standing balance support: Bilateral upper extremity supported Standing balance-Leahy Scale: Poor                             ADL either performed or assessed with clinical judgement   ADL       Grooming: Set up;Sitting   Upper Body Bathing: Set up;Sitting   Lower Body Bathing: Moderate assistance;Sit to/from stand;Sitting/lateral leans   Upper Body Dressing : Set up;Sitting   Lower Body Dressing: Moderate assistance;Sitting/lateral leans;Sit to/from stand               Functional mobility during ADLs: Minimal assistance;Cueing for sequencing;Rolling walker (2 wheels)      Extremity/Trunk Assessment Upper Extremity Assessment Upper Extremity Assessment: Overall WFL for tasks assessed   Lower Extremity Assessment Lower Extremity Assessment: Defer to PT evaluation   Cervical / Trunk Assessment Cervical / Trunk Assessment: Normal                      Cognition Arousal/Alertness: Awake/alert Behavior During Therapy: WFL for tasks assessed/performed Overall Cognitive Status: Within Functional Limits for tasks assessed                                                       General Comments  HR to 101 with mobility.  Pertinent Vitals/ Pain       Pain Assessment Pain Assessment: Faces Faces Pain Scale: Hurts even more Pain Descriptors / Indicators: Grimacing, Guarding, Sore, Sharp Pain Intervention(s): Patient requesting pain meds-RN notified                                                          Frequency  Min 2X/week        Progress Toward Goals  OT Goals(current goals can now be found in the care plan section)  Progress towards OT goals: Progressing toward goals  Acute Rehab OT Goals OT Goal Formulation: With patient Time For Goal Achievement: 12/21/21 Potential to Achieve Goals: Good  Plan Discharge plan remains appropriate     Co-evaluation                 AM-PAC OT "6 Clicks" Daily Activity     Outcome Measure   Help from another person eating meals?: None Help from another person taking care of personal grooming?: A Little Help from another person toileting, which includes using toliet, bedpan, or urinal?: A Lot Help from another person bathing (including washing, rinsing, drying)?: A Lot Help from another person to put on and taking off regular upper body clothing?: A Little Help from another person to put on and taking off regular lower body clothing?: A Lot 6 Click Score: 16    End of Session Equipment Utilized During Treatment: Rolling walker (2 wheels)  OT Visit Diagnosis: Unsteadiness on feet (R26.81);Muscle weakness (generalized) (M62.81);Pain Pain - Right/Left: Left Pain - part of body: Leg;Ankle and joints of foot   Activity Tolerance Patient limited by pain   Patient Left in chair;with call bell/phone within reach;with chair alarm set   Nurse Communication Patient requests pain meds        Time: 1015-1050 OT Time Calculation (min): 35 min  Charges: OT General Charges $OT Visit: 1 Visit OT Treatments $Self Care/Home Management : 23-37 mins  12/08/2021  RP, OTR/L  Acute Rehabilitation Services  Office:  [phone]   Metta Clines 12/08/2021, 10:54 AM

## 2021-12-08 NOTE — Progress Notes (Addendum)
  Progress Note    12/08/2021 6:48 AM 2 Days Post-Op  Subjective:  says she is having shooting pains in the left foot around the ankle.  Walked some yesterday.    Tm 99.5 HR 69'G-295'M NSR 841'L systolic 24% RA  Vitals:   12/07/21 2310 12/08/21 0330  BP: (!) 140/74 (!) 149/70  Pulse: (!) 102   Resp: 19 (!) 24  Temp: 99 F (37.2 C) 99.5 F (37.5 C)  SpO2: 95% 93%    Physical Exam: General:  no distress Lungs:  non labored Incisions:  left groin and left thigh incisions clean and dry Extremities:  brisk doppler flow left DP/pero>PT Abdomen:  soft  CBC    Component Value Date/Time   WBC 9.5 12/07/2021 0559   RBC 3.59 (L) 12/07/2021 0559   HGB 9.9 (L) 12/07/2021 0559   HCT 30.0 (L) 12/07/2021 0559   PLT 264 12/07/2021 0559   MCV 83.6 12/07/2021 0559   MCH 27.6 12/07/2021 0559   MCHC 33.0 12/07/2021 0559   RDW 15.2 12/07/2021 0559   LYMPHSABS 1.5 07/11/2007 0525   MONOABS 0.8 07/11/2007 0525   EOSABS 0.4 07/11/2007 0525   BASOSABS 0.0 07/11/2007 0525    BMET    Component Value Date/Time   NA 138 12/07/2021 0559   K 3.4 (L) 12/07/2021 0559   CL 103 12/07/2021 0559   CO2 23 12/07/2021 0559   GLUCOSE 138 (H) 12/07/2021 0559   BUN 20 12/07/2021 0559   CREATININE 1.27 (H) 12/07/2021 0559   CALCIUM 8.7 (L) 12/07/2021 0559   GFRNONAA 47 (L) 12/07/2021 0559   GFRAA  07/10/2007 0515    >60        The eGFR has been calculated using the MDRD equation. This calculation has not been validated in all clinical    INR    Component Value Date/Time   INR 1.1 12/02/2021 1332     Intake/Output Summary (Last 24 hours) at 12/08/2021 0648 Last data filed at 12/07/2021 2007 Gross per 24 hour  Intake 480 ml  Output 1250 ml  Net -770 ml     Assessment/Plan:  66 y.o. female is s/p:  1.  Extensive left common femoral, external iliac, profundofemoral and SFA endarterectomy with ligation of proximal left SFA and vein patch angioplasty of left common femoral artery 2.   Harvest of left greater saphenous vein in the mid thigh 3.  Left common femoral to above-knee popliteal artery bypass with 6 mm ringed PTFE   2 Days Post-Op   -pt with brisk doppler flow left foot -some pain in the left foot-most likely due to reperfusion.   -continue mobilizing -again discussed dry gauze to left groin.  She will need meticulous wound care to help prevent wound infection.   -DVT prophylaxis:  sq heparin -continue asa/statin   Leontine Locket, PA-C [facility] [phone] 12/08/2021 6:48 AM  I have independently interviewed and examined patient and agree with PA assessment and plan above.   Agam Davenport C. Donzetta Matters, MD [facility] [place] Office: [phone] Pager: [phone]

## 2021-12-08 NOTE — Progress Notes (Signed)
Mobility Specialist - Progress Note   Pre-mobility: 98bpm HR During mobility: 130bpm HR Post-mobility: 106bpm HR, 142/94 (108) BP, 100 SPO2   12/08/21 1643  Mobility  Activity Ambulated with assistance in hallway  Level of Assistance Minimal assist, patient does 75% or more (contact guard while ambulating)  Assistive Device Front wheel walker  Distance Ambulated (ft) 52 ft  Activity Response Tolerated well  $Mobility charge 1 Mobility   Pt was found in recliner chair and was agreeable to mobilize. While ambulating c/o a level 9/10 pain above her knee. Upon returning to room was left on bed with all necessities in reach. RN was notified of session.  Ferd Hibbs Mobility Specialist

## 2021-12-09 LAB — GLUCOSE, CAPILLARY
Glucose-Capillary: 144 mg/dL — ABNORMAL HIGH (ref 70–99)
Glucose-Capillary: 238 mg/dL — ABNORMAL HIGH (ref 70–99)
Glucose-Capillary: 231 mg/dL — ABNORMAL HIGH (ref 70–99)
Glucose-Capillary: 244 mg/dL — ABNORMAL HIGH (ref 70–99)

## 2021-12-09 NOTE — Progress Notes (Signed)
Physical Therapy Treatment Patient Details Name: Chelsia Martinique Magoon MRN: [id number] DOB: 03-23-1956 Today's Date: 12/09/2021   History of Present Illness Pt is 66 yo female who presented on 8/8 with slow healing wound L calf and underwent L common femoral endarterectomy with L FPBG. PMH: DMII, GERD, HLD, HTN, kidney disease, smoker.    PT Comments    Pt received OOB in recliner and agreeable to session with good progress. Session focused on safe stair ascent/descent doe ultimate safe d/c to home. Pt demonstrating stable ambulation in room with RW and min guard for safety. Pt with good recall of sequencing with LE and RW. Pt rolled to stairwell in chair and able to ascend/descend 3 steps with step to pattern and min assist to power up with no LOB and good recall of sequencing. Pt continues to benefit from skilled PT services to progress toward functional mobility goals.     Recommendations for follow up therapy are one component of a multi-disciplinary discharge planning process, led by the attending physician.  Recommendations may be updated based on patient status, additional functional criteria and insurance authorization.  Follow Up Recommendations  Home health PT     Assistance Recommended at Discharge Intermittent Supervision/Assistance  Patient can return home with the following A little help with walking and/or transfers;A little help with bathing/dressing/bathroom;Assistance with cooking/housework;Assist for transportation;Help with stairs or ramp for entrance   Equipment Recommendations  Rolling walker (2 wheels)    Recommendations for Other Services       Precautions / Restrictions Precautions Precautions: Fall Restrictions Weight Bearing Restrictions: No     Mobility  Bed Mobility Overal bed mobility: Needs Assistance             General bed mobility comments: pt OOB in recliner pre and post session    Transfers Overall transfer level: Needs  assistance Equipment used: Rolling walker (2 wheels) Transfers: Sit to/from Stand Sit to Stand: Min assist           General transfer comment: light min assist to power up from recliner    Ambulation/Gait Ambulation/Gait assistance: Min guard Gait Distance (Feet): 30 Feet Assistive device: Rolling walker (2 wheels) Gait Pattern/deviations: Step-to pattern, Decreased stance time - left, Decreased stride length, Antalgic Gait velocity: decr     General Gait Details: slow antalgic step to gait, distance limited as session focused on stair negotiation   Stairs Stairs: Yes Stairs assistance: Min assist Stair Management: One rail Right, Step to pattern, Forwards Number of Stairs: 3 General stair comments: asced/descent in stairwell, no LOB good power up and recall of sequencing   Wheelchair Mobility    Modified Rankin (Stroke Patients Only)       Balance Overall balance assessment: Needs assistance Sitting-balance support: Feet supported Sitting balance-Leahy Scale: Fair     Standing balance support: Bilateral upper extremity supported Standing balance-Leahy Scale: Poor Standing balance comment: heavy reliance on BUE support on RW                            Cognition Arousal/Alertness: Awake/alert Behavior During Therapy: WFL for tasks assessed/performed Overall Cognitive Status: Within Functional Limits for tasks assessed                                          Exercises      General Comments  Pertinent Vitals/Pain Pain Assessment Pain Assessment: Faces Faces Pain Scale: Hurts a little bit Pain Location: LLE Pain Descriptors / Indicators: Grimacing, Guarding, Sore, Sharp Pain Intervention(s): Monitored during session, Limited activity within patient's tolerance    Home Living                          Prior Function            PT Goals (current goals can now be found in the care plan section) Acute  Rehab PT Goals Patient Stated Goal: return home PT Goal Formulation: With patient Time For Goal Achievement: 12/20/21    Frequency    Min 3X/week      PT Plan      Co-evaluation              AM-PAC PT "6 Clicks" Mobility   Outcome Measure  Help needed turning from your back to your side while in a flat bed without using bedrails?: A Little Help needed moving from lying on your back to sitting on the side of a flat bed without using bedrails?: A Little Help needed moving to and from a bed to a chair (including a wheelchair)?: A Little Help needed standing up from a chair using your arms (e.g., wheelchair or bedside chair)?: A Little Help needed to walk in hospital room?: A Little Help needed climbing 3-5 steps with a railing? : A Lot 6 Click Score: 17    End of Session Equipment Utilized During Treatment: Gait belt Activity Tolerance: Patient tolerated treatment well Patient left: in chair;with call bell/phone within reach Nurse Communication: Mobility status PT Visit Diagnosis: Pain;Difficulty in walking, not elsewhere classified (R26.2) Pain - Right/Left: Left Pain - part of body: Leg     Time: 6712-4580 PT Time Calculation (min) (ACUTE ONLY): 44 min  Charges:  $Gait Training: 23-37 mins $Therapeutic Activity: 8-22 mins                     Gedalya Jim R. PTA Acute Rehabilitation Services Office: [place] 12/09/2021, 3:40 PM

## 2021-12-09 NOTE — TOC Initial Note (Signed)
Transition of Care (TOC) - Initial/Assessment Note  Marvetta Gibbons RN, BSN Transitions of Care Unit 4E- RN Case Manager See Treatment Team for direct phone #    Patient Details  Name: [name] Houston MRN: [id number] Date of Birth: June 22, 1955  Transition of Care [facility]) CM/SW Contact:    Dawayne Patricia, RN Phone Number: 12/09/2021, 10:54 AM  Clinical Narrative:                 Pt from home w/ spouse, s/p Fempop bypass- HH orders placed for HHPT/OT.  CM in to speak with pt at bedside- pt agreeable to [facility] services, list provided for [facility] choice Per CMS guidelines from medicare.gov website with star ratings (copy placed in shadow chart), pt has used [facility] in the past and would like to use them again, has selected [facility] as backup.   Discussed recommended DME- RW and 3n1. Pt voiced she needs RW for home, had BSC in past but gave it away. Wants to see if insurance will cover another, explained insurance may not cover and out of pocket cost to get one from in house provider is $38. Will have [employer] call pt regarding DME cost- pt voiced understanding.   Address, phone # and PCP all confirmed in epic. Pt voiced she will have transportation home.   Call made to [facility] w/ [facility])- however they can not do start of cars until next week due to staffing issues.  Checked with Latricia Heft- they are able to accept referral with no delay for start of care- HHPT/OT referral accepted by [facility]. They will follow for d/c and call pt post discharge to schedule.   Call made to [facility] for DME- RW and 3n1- they will contact pt for DME cost- and deliver to room prior to discharge    Expected Discharge Plan: [place] Barriers to Discharge: Continued Medical Work up   Patient Goals and CMS Choice Patient states their goals for this hospitalization and ongoing recovery are:: return home CMS Medicare.gov Compare Post Acute Care list provided to:: Patient Choice offered to / list  presented to : Patient  Expected Discharge Plan and Services Expected Discharge Plan: [place]   Discharge Planning Services: CM Consult Post Acute Care Choice: Durable Medical Equipment, Home Health Living arrangements for the past 2 months: Single Family Home                 DME Arranged: 3-N-1, Walker rolling DME Agency: [facility] Date DME Agency Contacted: 12/09/21 Time DME Agency Contacted: 3545 Representative spoke with at DME Agency: Jodell Cipro HH Arranged: PT, OT [place] Agency: [place] Date [place]: 12/09/21 Time [place]: 1053 Representative spoke with at [place]: [place] Arrangements/Services Living arrangements for the past 2 months: [place] Lives with:: Spouse Patient language and need for interpreter reviewed:: Yes Do you feel safe going back to the place where you live?: Yes      Need for Family Participation in Patient Care: Yes (Comment) Care giver support system in place?: Yes (comment) Current home services: DME Kasandra Knudsen) Criminal Activity/Legal Involvement Pertinent to Current Situation/Hospitalization: No - Comment as needed  Activities of Daily Living      Permission Sought/Granted Permission sought to share information with : Facility Art therapist granted to share information with : Yes, Verbal Permission Granted     Permission granted to share info w AGENCY: HH/DME  Emotional Assessment Appearance:: Appears stated age Attitude/Demeanor/Rapport: Engaged Affect (typically observed): Accepting, Appropriate, Pleasant Orientation: : Oriented to Self, Oriented to Place, Oriented to Situation, Oriented to  Time Alcohol / Substance Use: Not Applicable Psych Involvement: No (comment)  Admission diagnosis:  Critical lower limb ischemia [facility]) [I70.229] Patient Active Problem List   Diagnosis Date Noted   Critical lower limb ischemia ([place]) 12/06/2021    DIABETES MELLITUS, TYPE II 08/23/2006   Essential hypertension 08/23/2006   INFECTION DUE TO INTERNAL JOINT PROSTHESIS 08/23/2006   PCP:  Harlan Stains, MD Pharmacy:   [facility] ([place]), [place] - [address] [place] ([place]) [place] [address] Phone: [phone] Fax: [phone]     Social Determinants of Health (SDOH) Interventions    Readmission Risk Interventions     No data to display

## 2021-12-09 NOTE — Progress Notes (Addendum)
  Progress Note    12/09/2021 6:43 AM 3 Days Post-Op  Subjective:  says she walked more yesterday.  Still having some shooting pain in her foot.  Has been keeping dry gauze in groin.  Afebrile HR 54'T-62'B NSR 638'L systolic 37% RA  Vitals:   12/08/21 2335 12/09/21 0336  BP: 134/71 132/86  Pulse: 92 91  Resp: 20 16  Temp: 98.9 F (37.2 C) 98.7 F (37.1 C)  SpO2: 96% 96%    Physical Exam: Cardiac:  regular Lungs:  non labored Incisions:  left groin and left thigh incisions look good.  Extremities:  brisk multiphasic left DP doppler signal.  Left foot is warm and well perfused.  Abdomen:  soft, NT  CBC    Component Value Date/Time   WBC 9.5 12/07/2021 0559   RBC 3.59 (L) 12/07/2021 0559   HGB 9.9 (L) 12/07/2021 0559   HCT 30.0 (L) 12/07/2021 0559   PLT 264 12/07/2021 0559   MCV 83.6 12/07/2021 0559   MCH 27.6 12/07/2021 0559   MCHC 33.0 12/07/2021 0559   RDW 15.2 12/07/2021 0559   LYMPHSABS 1.5 07/11/2007 0525   MONOABS 0.8 07/11/2007 0525   EOSABS 0.4 07/11/2007 0525   BASOSABS 0.0 07/11/2007 0525    BMET    Component Value Date/Time   NA 138 12/07/2021 0559   K 3.4 (L) 12/07/2021 0559   CL 103 12/07/2021 0559   CO2 23 12/07/2021 0559   GLUCOSE 138 (H) 12/07/2021 0559   BUN 20 12/07/2021 0559   CREATININE 1.27 (H) 12/07/2021 0559   CALCIUM 8.7 (L) 12/07/2021 0559   GFRNONAA 47 (L) 12/07/2021 0559   GFRAA  07/10/2007 0515    >60        The eGFR has been calculated using the MDRD equation. This calculation has not been validated in all clinical    INR    Component Value Date/Time   INR 1.1 12/02/2021 1332     Intake/Output Summary (Last 24 hours) at 12/09/2021 0643 Last data filed at 12/09/2021 3428 Gross per 24 hour  Intake 360 ml  Output 1450 ml  Net -1090 ml     Assessment/Plan:  66 y.o. female is s/p:  1.  Extensive left common femoral, external iliac, profundofemoral and SFA endarterectomy with ligation of proximal left SFA and  vein patch angioplasty of left common femoral artery 2.  Harvest of left greater saphenous vein in the mid thigh 3.  Left common femoral to above-knee popliteal artery bypass with 6 mm ringed PTFE  3 Days Post-Op   -pt continues to have brisk doppler flow left foot.   -continue to increase mobilization.  PT/OT recommending HHPT/OT.  Will place TOC and face to face orders -continue dry gauze to left groin bid and as needed.  -DVT prophylaxis:  sq heparin   Leontine Locket, PA-C [facility] [phone] 12/09/2021 6:43 AM   I have independently interviewed and examined patient and agree with PA assessment and plan above.   Lael Wetherbee C. Donzetta Matters, MD [facility] [place] Office: [phone] Pager: [phone]

## 2021-12-09 NOTE — Progress Notes (Signed)
Occupational Therapy Treatment Patient Details Name: [name] Houston MRN: [id number] DOB: 04/11/1956 Today's Date: 12/09/2021   History of present illness Pt is 66 yo female who presented on 8/8 with slow healing wound L calf and underwent L common femoral endarterectomy with L FPBG. PMH: DMII, GERD, HLD, HTN, kidney disease, smoker. (Simultaneous filing. User may not have seen previous data.)   OT comments  Patient completed supine to sitting EOB with  min A to move L LE to EOB.  Patient completed sit to stand with  min A and transferred to [facility] using RW and min A with verbal cues for overall safety and hand placement.  Total A for clothing mgmt but patient is making progress toward standing front peri hygiene which patient completed with  min guard.  Patient transferred to bedside chair from [facility] using RW and taking 6-7 steps and completed UB bathing with Supervision s/p setup and mod A for LB bathing and min A for donning clean hospital gown.  Patient would benefit from additional OT intervention to address functional deficits in order for patient to return to PLOF.   Recommendations for follow up therapy are one component of a multi-disciplinary discharge planning process, led by the attending physician.  Recommendations may be updated based on patient status, additional functional criteria and insurance authorization.    Follow Up Recommendations  Home health OT    Assistance Recommended at Discharge Frequent or constant Supervision/Assistance  Patient can return home with the following  A little help with walking and/or transfers;A lot of help with bathing/dressing/bathroom;Assist for transportation;Help with stairs or ramp for entrance   Equipment Recommendations  BSC/3in1    Recommendations for Other Services      Precautions / Restrictions Precautions Precautions: Fall (Simultaneous filing. User may not have seen previous data.) Restrictions Weight Bearing Restrictions: No  (Simultaneous filing. User may not have seen previous data.)       Mobility Bed Mobility Overal bed mobility: Needs Assistance (Simultaneous filing. User may not have seen previous data.) Bed Mobility: Supine to Sit     Supine to sit: Min assist          Transfers Overall transfer level: Needs assistance (Simultaneous filing. User may not have seen previous data.) Equipment used: Rolling walker (2 wheels) (Simultaneous filing. User may not have seen previous data.) Transfers: Sit to/from Stand, Bed to chair/wheelchair/BSC (Simultaneous filing. User may not have seen previous data.) Sit to Stand: Min assist (Simultaneous filing. User may not have seen previous data.) Stand pivot transfers: Min assist   Step pivot transfers: Min assist     General transfer comment: light min assist to power up from recliner and BSC (Simultaneous filing. User may not have seen previous data.)     Balance Overall balance assessment: Needs assistance (Simultaneous filing. User may not have seen previous data.)         Standing balance support: Bilateral upper extremity supported (Simultaneous filing. User may not have seen previous data.)                               ADL either performed or assessed with clinical judgement   ADL Overall ADL's : Needs assistance/impaired     Grooming: Set up;Sitting   Upper Body Bathing: Set up;Sitting   Lower Body Bathing: Moderate assistance;Set up   Upper Body Dressing : Set up;Sitting   Lower Body Dressing: Moderate assistance;Sit to/from stand   Toilet  Transfer: Minimal assistance;Cueing for safety;BSC/3in1;Rolling walker (2 wheels)   Toileting- Clothing Manipulation and Hygiene: Total assistance;Cueing for safety       Functional mobility during ADLs: Minimal assistance;Cueing for safety;Rolling walker (2 wheels) General ADL Comments: patient able to complete front peri hygiene with min guard wehile standing and qith 1 UE  support on RW    Extremity/Trunk Assessment Upper Extremity Assessment Upper Extremity Assessment: Overall WFL for tasks assessed   Lower Extremity Assessment LLE Deficits / Details: Pain at incision        Vision   Vision Assessment?: No apparent visual deficits   Perception     Praxis      Cognition Arousal/Alertness: Awake/alert (Simultaneous filing. User may not have seen previous data.) Behavior During Therapy: [facility] for tasks assessed/performed (Simultaneous filing. User may not have seen previous data.) Overall Cognitive Status: Within Functional Limits for tasks assessed (Simultaneous filing. User may not have seen previous data.)                                          Exercises      Shoulder Instructions       General Comments      Pertinent Vitals/ Pain       Pain Assessment Pain Assessment: 0-10 (Simultaneous filing. User may not have seen previous data.) Pain Score: 6  Breathing: normal Pain Location: LLE (Simultaneous filing. User may not have seen previous data.) Pain Descriptors / Indicators: Grimacing, Guarding, Sore, Sharp (Simultaneous filing. User may not have seen previous data.) Pain Intervention(s): Patient requesting pain meds-RN notified (Simultaneous filing. User may not have seen previous data.)  Home Living                                          Prior Functioning/Environment              Frequency  Min 2X/week        Progress Toward Goals  OT Goals(current goals can now be found in the care plan section)  Progress towards OT goals: Progressing toward goals  Acute Rehab OT Goals OT Goal Formulation: With patient Time For Goal Achievement: 12/21/21 Potential to Achieve Goals: Good ADL Goals Pt Will Transfer to Toilet: with supervision;ambulating;bedside commode Pt Will Perform Toileting - Clothing Manipulation and hygiene: with min assist;sit to/from stand Additional ADL Goal #1: Pt  will increase to x5 mins of OOB ADL tasks in sitting or standing with minA overall. Additional ADL Goal #2: Pt will verbalize 3 energy conservation techniques to incraese activity tolerance.  Plan Discharge plan remains appropriate    Co-evaluation                 AM-PAC OT "6 Clicks" Daily Activity     Outcome Measure   Help from another person eating meals?: None Help from another person taking care of personal grooming?: A Little Help from another person toileting, which includes using toliet, bedpan, or urinal?: A Lot Help from another person bathing (including washing, rinsing, drying)?: A Lot Help from another person to put on and taking off regular upper body clothing?: A Little Help from another person to put on and taking off regular lower body clothing?: A Lot 6 Click Score: 16    End of Session Equipment Utilized  During Treatment: Rolling walker (2 wheels)  OT Visit Diagnosis: Unsteadiness on feet (R26.81);Muscle weakness (generalized) (M62.81);Pain Pain - Right/Left: Left Pain - part of body: Leg   Activity Tolerance Patient limited by pain   Patient Left in chair;with call bell/phone within reach (feet elevated; L LE elevated on pillow)   Nurse Communication Patient requests pain meds        Time: 4098-1191 OT Time Calculation (min): 44 min  Charges: OT Treatments $Self Care/Home Management : 38-52 mins  Mervyn Skeeters, OTR/L  Hadley Pen 12/09/2021, 2:46 PM

## 2021-12-09 NOTE — Care Management Important Message (Signed)
Important Message  Patient Details  Name: Linlee Martinique Mumby MRN: [id number] Date of Birth: [birth year]   Medicare Important Message Given:  Yes     Shelda Altes 12/09/2021, 8:08 AM

## 2021-12-10 LAB — GLUCOSE, CAPILLARY
Glucose-Capillary: 174 mg/dL — ABNORMAL HIGH (ref 70–99)
Glucose-Capillary: 154 mg/dL — ABNORMAL HIGH (ref 70–99)
Glucose-Capillary: 241 mg/dL — ABNORMAL HIGH (ref 70–99)
Glucose-Capillary: 168 mg/dL — ABNORMAL HIGH (ref 70–99)
Glucose-Capillary: 157 mg/dL — ABNORMAL HIGH (ref 70–99)

## 2021-12-10 MED ORDER — MEDIHONEY WOUND/BURN DRESSING EX PSTE
1.0000 | PASTE | Freq: Every day | CUTANEOUS | Status: DC
Start: 2021-12-10 — End: 2021-12-17
  Administered 2021-12-12 – 2021-12-16 (×4): 1 via TOPICAL
  Filled 2021-12-10: qty 44

## 2021-12-10 NOTE — Progress Notes (Addendum)
Progress Note    12/10/2021 7:27 AM 4 Days Post-Op  Subjective:  looks good this morning.  Said she did some steps yesterday with PT.  Feels she needs a little more time before discharge home.   Afebrile HR 03'O-12'Y NSR 482'N systolic 003% RA  Vitals:   12/10/21 0002 12/10/21 0345  BP: 139/73 131/86  Pulse: 86 90  Resp: 20 19  Temp: 98.5 F (36.9 C) 98.8 F (37.1 C)  SpO2: 98% 100%    Physical Exam: Cardiac:  regular Lungs:  non labored Incisions:  left thigh incisions are healing nicely.  Mild superficial dehiscence of proximal portion of left groin incision. No evidence of infection.   Extremities:  brisk biphasic doppler flow left DP.  Left foot is warm and well perfused.  Wound on LLE malodorous.   Abdomen:  soft, NT  CBC    Component Value Date/Time   WBC 9.5 12/07/2021 0559   RBC 3.59 (L) 12/07/2021 0559   HGB 9.9 (L) 12/07/2021 0559   HCT 30.0 (L) 12/07/2021 0559   PLT 264 12/07/2021 0559   MCV 83.6 12/07/2021 0559   MCH 27.6 12/07/2021 0559   MCHC 33.0 12/07/2021 0559   RDW 15.2 12/07/2021 0559   LYMPHSABS 1.5 07/11/2007 0525   MONOABS 0.8 07/11/2007 0525   EOSABS 0.4 07/11/2007 0525   BASOSABS 0.0 07/11/2007 0525    BMET    Component Value Date/Time   NA 138 12/07/2021 0559   K 3.4 (L) 12/07/2021 0559   CL 103 12/07/2021 0559   CO2 23 12/07/2021 0559   GLUCOSE 138 (H) 12/07/2021 0559   BUN 20 12/07/2021 0559   CREATININE 1.27 (H) 12/07/2021 0559   CALCIUM 8.7 (L) 12/07/2021 0559   GFRNONAA 47 (L) 12/07/2021 0559   GFRAA  07/10/2007 0515    >60        The eGFR has been calculated using the MDRD equation. This calculation has not been validated in all clinical    INR    Component Value Date/Time   INR 1.1 12/02/2021 1332     Intake/Output Summary (Last 24 hours) at 12/10/2021 0727 Last data filed at 12/10/2021 7048 Gross per 24 hour  Intake 720 ml  Output 1700 ml  Net -980 ml     Assessment/Plan:  66 y.o. female is s/p:   1.  Extensive left common femoral, external iliac, profundofemoral and SFA endarterectomy with ligation of proximal left SFA and vein patch angioplasty of left common femoral artery 2.  Harvest of left greater saphenous vein in the mid thigh 3.  Left common femoral to above-knee popliteal artery bypass with 6 mm ringed PTFE   4 Days Post-Op   -pt continues to have brisk doppler flow left foot.  Left groin wound with superficial dehiscence on proximal portion of incision.  No evidence of infection.  Stressed importance of cleaning with soap and water then keeping dry otherwise to help prevent wound infection.   Thigh incisions healing nicely.  -wound on left lower extremity malodorous.  She has been seen in the wound care center and states they were using medi-honey on her wound.   I have re-ordered this.  -DVT prophylaxis:  sq heparin -continue working with PT-she is making good progress -she has been accepted by [place] for [place], PA-C [facility] [phone] 12/10/2021 7:27 AM  VASCULAR STAFF ADDENDUM: I have independently interviewed and examined the patient. I agree with the above.  Left groin  looks good. Clean, asked nursing to keep a dry dressing in the inguinal fold to wick moisture Devitalized tissue in the calf being followed by the wound center. Keep dry for the time being. No dressings that contain moisture.  Will hold and see tomorrow. Possible discharge Sunday pending continued improvement   J. Melene Muller, MD [facility] Phone Number: [phone] 12/10/2021 9:58 AM

## 2021-12-11 LAB — GLUCOSE, CAPILLARY
Glucose-Capillary: 146 mg/dL — ABNORMAL HIGH (ref 70–99)
Glucose-Capillary: 212 mg/dL — ABNORMAL HIGH (ref 70–99)
Glucose-Capillary: 260 mg/dL — ABNORMAL HIGH (ref 70–99)
Glucose-Capillary: 194 mg/dL — ABNORMAL HIGH (ref 70–99)

## 2021-12-11 MED ORDER — DAKINS (1/4 STRENGTH) 0.125 % EX SOLN
Freq: Every day | CUTANEOUS | Status: DC
  Administered 2021-12-11: 1
  Filled 2021-12-11: qty 473

## 2021-12-11 NOTE — Progress Notes (Addendum)
  Progress Note    12/11/2021 7:01 AM 5 Days Post-Op  Subjective:  no complaints this morning.   Afebrile HR 70's-90's  578'I-696'E systolic 95% RA  Vitals:   12/10/21 2335 12/11/21 0422  BP: 121/66 139/82  Pulse: 81 80  Resp: 18 15  Temp: 98.6 F (37 C) 98.3 F (36.8 C)  SpO2: 97% 99%    Physical Exam: Cardiac:  regular Lungs:  non labored Incisions:  left groin unchanged.  Extremities:  brisk left DP doppler signal; malodorous wound left calf Abdomen:  soft  CBC    Component Value Date/Time   WBC 9.5 12/07/2021 0559   RBC 3.59 (L) 12/07/2021 0559   HGB 9.9 (L) 12/07/2021 0559   HCT 30.0 (L) 12/07/2021 0559   PLT 264 12/07/2021 0559   MCV 83.6 12/07/2021 0559   MCH 27.6 12/07/2021 0559   MCHC 33.0 12/07/2021 0559   RDW 15.2 12/07/2021 0559   LYMPHSABS 1.5 07/11/2007 0525   MONOABS 0.8 07/11/2007 0525   EOSABS 0.4 07/11/2007 0525   BASOSABS 0.0 07/11/2007 0525    BMET    Component Value Date/Time   NA 138 12/07/2021 0559   K 3.4 (L) 12/07/2021 0559   CL 103 12/07/2021 0559   CO2 23 12/07/2021 0559   GLUCOSE 138 (H) 12/07/2021 0559   BUN 20 12/07/2021 0559   CREATININE 1.27 (H) 12/07/2021 0559   CALCIUM 8.7 (L) 12/07/2021 0559   GFRNONAA 47 (L) 12/07/2021 0559   GFRAA  07/10/2007 0515    >60        The eGFR has been calculated using the MDRD equation. This calculation has not been validated in all clinical    INR    Component Value Date/Time   INR 1.1 12/02/2021 1332     Intake/Output Summary (Last 24 hours) at 12/11/2021 0701 Last data filed at 12/11/2021 0424 Gross per 24 hour  Intake --  Output 1000 ml  Net -1000 ml     Assessment/Plan:  66 y.o. Houston is s/p:  1.  Extensive left common femoral, external iliac, profundofemoral and SFA endarterectomy with ligation of proximal left SFA and vein patch angioplasty of left common femoral artery 2.  Harvest of left greater saphenous vein in the mid thigh 3.  Left common femoral to  above-knee popliteal artery bypass with 6 mm ringed PTFE   5 Days Post-Op   -pt continues to have brisk doppler signal left DP -left groin incision clean and unchanged.  Nursing doing a great job with dray gauze in groin fold to wick moisture.  Discussed with RN cleansing groin with soap and water, drying then replacing dry gauze.   -left calf wound remains malodorous.  Will start dakin's sol while hospitalized. -DVT prophylaxis:  sq heparin -continue mobilizing   Leontine Locket, PA-C [facility] [phone] 12/11/2021 7:01 AM  VASCULAR STAFF ADDENDUM: I have independently interviewed and examined the patient. I agree with the above.  Asked for one more day for physical therapy prior to discharge. I think this is reasonable  Cassandria Santee, MD [facility] of [facility] Phone Number: [phone] 12/11/2021 10:02 AM

## 2021-12-11 NOTE — Progress Notes (Signed)
Mobility Specialist Progress Note    12/11/21 1507  Mobility  Activity Ambulated with assistance in hallway  Level of Assistance Minimal assist, patient does 75% or more  Assistive Device Front wheel walker  Distance Ambulated (ft) 100 ft (10+90)  Activity Response Tolerated well  $Mobility charge 1 Mobility   During Mobility: 127 HR Post-Mobility: 98 HR  Pt received in chair and agreeable. Had BM in BR. C/o leg pain during. Returned to bed with call bell in reach.    Hildred Alamin Mobility Specialist

## 2021-12-12 LAB — BASIC METABOLIC PANEL
Anion gap: 11 (ref 5–15)
BUN: 23 mg/dL (ref 8–23)
CO2: 24 mmol/L (ref 22–32)
Calcium: 8.7 mg/dL — ABNORMAL LOW (ref 8.9–10.3)
Chloride: 98 mmol/L (ref 98–111)
Creatinine, Ser: 1.23 mg/dL — ABNORMAL HIGH (ref 0.44–1.00)
GFR, Estimated: 49 mL/min — ABNORMAL LOW (ref >60)
Glucose, Bld: 200 mg/dL — ABNORMAL HIGH (ref 70–99)
Potassium: 3.5 mmol/L (ref 3.5–5.1)
Sodium: 133 mmol/L — ABNORMAL LOW (ref 135–145)

## 2021-12-12 LAB — GLUCOSE, CAPILLARY
Glucose-Capillary: 148 mg/dL — ABNORMAL HIGH (ref 70–99)
Glucose-Capillary: 172 mg/dL — ABNORMAL HIGH (ref 70–99)
Glucose-Capillary: 194 mg/dL — ABNORMAL HIGH (ref 70–99)
Glucose-Capillary: 145 mg/dL — ABNORMAL HIGH (ref 70–99)

## 2021-12-12 MED ORDER — PIPERACILLIN-TAZOBACTAM 3.375 G IVPB 30 MIN
3.3750 g | Freq: Once | INTRAVENOUS | Status: AC
  Administered 2021-12-12: 3.375 g via INTRAVENOUS
  Filled 2021-12-12 (×2): qty 50

## 2021-12-12 MED ORDER — VANCOMYCIN HCL 1750 MG/350ML IV SOLN
1750.0000 mg | Freq: Once | INTRAVENOUS | Status: AC
Start: 2021-12-12 — End: 2021-12-12
  Administered 2021-12-12: 1750 mg via INTRAVENOUS
  Filled 2021-12-12: qty 350

## 2021-12-12 MED ORDER — PIPERACILLIN-TAZOBACTAM 3.375 G IVPB
3.3750 g | Freq: Three times a day (TID) | INTRAVENOUS | Status: DC
  Administered 2021-12-12 – 2021-12-15 (×10): 3.375 g via INTRAVENOUS
  Filled 2021-12-12 (×9): qty 50

## 2021-12-12 MED ORDER — VANCOMYCIN HCL 750 MG/150ML IV SOLN
750.0000 mg | INTRAVENOUS | Status: DC
  Administered 2021-12-13 – 2021-12-15 (×3): 750 mg via INTRAVENOUS
  Filled 2021-12-12 (×3): qty 150

## 2021-12-12 NOTE — Inpatient Diabetes Management (Signed)
Inpatient Diabetes Program Recommendations  AACE/ADA: New Consensus Statement on Inpatient Glycemic Control  Target Ranges:  Prepandial:   less than 140 mg/dL      Peak postprandial:   less than 180 mg/dL (1-2 hours)      Critically ill patients:  140 - 180 mg/dL    Latest Reference Range & Units 12/11/21 06:17 12/11/21 11:30 12/11/21 16:32 12/11/21 22:00 12/12/21 06:05  Glucose-Capillary 70 - 99 mg/dL 146 (H) 212 (H) 260 (H) 194 (H) 148 (H)   Review of Glycemic Control  Diabetes history: DM2 Outpatient Diabetes medications: Farxiga 10 mg daily, Glipizide 5 mg BID, Actos 15 mg daily, Ozempic 0.5 mg Qweek Current orders for Inpatient glycemic control: Novolog 0-15 units TID with meals  Inpatient Diabetes Program Recommendations:    Insulin: Please consider ordering Novolog 4 units TID with meals for meal coverage if patient eats at least 50% of meals.  Thanks, Barnie Alderman, RN, MSN, [place] Diabetes Coordinator Inpatient Diabetes Program [phone] (Team Pager from 8am to [place])

## 2021-12-12 NOTE — Plan of Care (Signed)
  Problem: Coping: Goal: Ability to adjust to condition or change in health will improve Outcome: Progressing   

## 2021-12-12 NOTE — Progress Notes (Signed)
Physical Therapy Treatment Patient Details Name: [name] Houston MRN: [id number] DOB: 01-21-1956 Today's Date: 12/12/2021   History of Present Illness Pt is 66 yo female who presented on 8/8 with slow healing wound L calf and underwent L common femoral endarterectomy with L FPBG. PMH: DMII, GERD, HLD, HTN, kidney disease, smoker.    PT Comments    Patient remains highly motivated to participate and progress despite pain with mobility. She requires assist to bring Lt LE off/onto bed and EOB elevated to improve pt's ability to power up with min guard. Cues at start for RW positioning and assist to manage with turn but no overt LOB noted and pt progressed heel to toe pattern at attempt increased weight bearing on Lt with VC's. Will continue to progress pt as able throughout acute stay and recommend HHPT follow up to maximize independence and safety at home.    Recommendations for follow up therapy are one component of a multi-disciplinary discharge planning process, led by the attending physician.  Recommendations may be updated based on patient status, additional functional criteria and insurance authorization.  Follow Up Recommendations  Home health PT     Assistance Recommended at Discharge Intermittent Supervision/Assistance  Patient can return home with the following A little help with walking and/or transfers;A little help with bathing/dressing/bathroom;Assistance with cooking/housework;Assist for transportation;Help with stairs or ramp for entrance   Equipment Recommendations  Rolling walker (2 wheels)    Recommendations for Other Services OT consult     Precautions / Restrictions Precautions Precautions: Fall Restrictions Weight Bearing Restrictions: No     Mobility  Bed Mobility Overal bed mobility: Needs Assistance Bed Mobility: Supine to Sit     Supine to sit: Min assist, HOB elevated Sit to supine: Min assist, HOB elevated   General bed mobility comments:  min A to progress to EOB for assistance with Lt LE, instructed pt on use of belt to return Lt LE onto bed for increased independence, however pt limitd by pain and assist needed for Lt LE despite belt. Cues for pt to use UE's and Rt LE to scoot superior in bed at EOS.    Transfers Overall transfer level: Needs assistance Equipment used: Rolling walker (2 wheels) Transfers: Sit to/from Stand Sit to Stand: Min guard           General transfer comment: close min guard for power up from EOB, pt with decreased weight shift on Lt LE secondary to pain. EOB elevated.    Ambulation/Gait Ambulation/Gait assistance: Min guard, Min assist Gait Distance (Feet): 35 Feet Assistive device: Rolling walker (2 wheels) Gait Pattern/deviations: Step-to pattern, Decreased stance time - left, Decreased stride length, Antalgic, Decreased weight shift to left Gait velocity: decr     General Gait Details: cautious gait speed and pt with reduced weight shift to Lt due to antalgia.min guard and cues intermittently for proximity to RW, assist to turn walker safely.   Stairs             Wheelchair Mobility    Modified Rankin (Stroke Patients Only)       Balance Overall balance assessment: Needs assistance Sitting-balance support: Feet supported Sitting balance-Leahy Scale: Fair     Standing balance support: Bilateral upper extremity supported Standing balance-Leahy Scale: Poor Standing balance comment: heavy reliance on BUE support on RW                            Cognition Arousal/Alertness: Awake/alert  Behavior During Therapy: WFL for tasks assessed/performed Overall Cognitive Status: Within Functional Limits for tasks assessed                                 General Comments: pleasant and eager to progress despite pain.        Exercises      General Comments        Pertinent Vitals/Pain Pain Assessment Pain Assessment: 0-10 Pain Score: 9  Pain  Location: LLE Pain Descriptors / Indicators: Grimacing, Guarding, Sore, Sharp Pain Intervention(s): Limited activity within patient's tolerance, Monitored during session, Repositioned    Home Living                          Prior Function            PT Goals (current goals can now be found in the care plan section) Acute Rehab PT Goals PT Goal Formulation: With patient Time For Goal Achievement: 12/20/21 Potential to Achieve Goals: Good Progress towards PT goals: Progressing toward goals    Frequency    Min 3X/week      PT Plan Current plan remains appropriate    Co-evaluation              AM-PAC PT "6 Clicks" Mobility   Outcome Measure  Help needed turning from your back to your side while in a flat bed without using bedrails?: A Little Help needed moving from lying on your back to sitting on the side of a flat bed without using bedrails?: A Little Help needed moving to and from a bed to a chair (including a wheelchair)?: A Little Help needed standing up from a chair using your arms (e.g., wheelchair or bedside chair)?: A Little Help needed to walk in hospital room?: A Little Help needed climbing 3-5 steps with a railing? : A Lot 6 Click Score: 17    End of Session Equipment Utilized During Treatment: Gait belt Activity Tolerance: Patient tolerated treatment well Patient left: in bed;with call bell/phone within reach;with bed alarm set (chair position in bed) Nurse Communication: Mobility status PT Visit Diagnosis: Pain;Difficulty in walking, not elsewhere classified (R26.2) Pain - Right/Left: Left Pain - part of body: Leg     Time: 8280-0349 PT Time Calculation (min) (ACUTE ONLY): 24 min  Charges:  $Gait Training: 8-22 mins                     Verner Mould, DPT Acute Rehabilitation Services Office [phone] Pager [phone]  12/12/21 4:10 PM

## 2021-12-12 NOTE — Progress Notes (Addendum)
  Progress Note    12/12/2021 6:45 AM 6 Days Post-Op  Subjective:  no complaints; walked yesterday  Afebrile HR 60's-90's NSR 170'Y-174'B systolic 449% RA  Vitals:   12/12/21 0026 12/12/21 0445  BP: (!) 129/59 (!) 132/59  Pulse: 84 78  Resp: 20 15  Temp: 98.8 F (37.1 C) 98.7 F (37.1 C)  SpO2: 99% 100%    Physical Exam: Cardiac:  regular Lungs:  non labored Incisions:  left groin incision now with increased drainage and new dehiscence distal incision also Extremities:  palpable left DP pulse.  Dressing removed from left calf wound.  Appearance not changed but odor improved.  Abdomen:  soft, NT  CBC    Component Value Date/Time   WBC 9.5 12/07/2021 0559   RBC 3.59 (L) 12/07/2021 0559   HGB 9.9 (L) 12/07/2021 0559   HCT 30.0 (L) 12/07/2021 0559   PLT 264 12/07/2021 0559   MCV 83.6 12/07/2021 0559   MCH 27.6 12/07/2021 0559   MCHC 33.0 12/07/2021 0559   RDW 15.2 12/07/2021 0559   LYMPHSABS 1.5 07/11/2007 0525   MONOABS 0.8 07/11/2007 0525   EOSABS 0.4 07/11/2007 0525   BASOSABS 0.0 07/11/2007 0525    BMET    Component Value Date/Time   NA 138 12/07/2021 0559   K 3.4 (L) 12/07/2021 0559   CL 103 12/07/2021 0559   CO2 23 12/07/2021 0559   GLUCOSE 138 (H) 12/07/2021 0559   BUN 20 12/07/2021 0559   CREATININE 1.27 (H) 12/07/2021 0559   CALCIUM 8.7 (L) 12/07/2021 0559   GFRNONAA 47 (L) 12/07/2021 0559   GFRAA  07/10/2007 0515    >60        The eGFR has been calculated using the MDRD equation. This calculation has not been validated in all clinical    INR    Component Value Date/Time   INR 1.1 12/02/2021 1332     Intake/Output Summary (Last 24 hours) at 12/12/2021 0645 Last data filed at 12/11/2021 1630 Gross per 24 hour  Intake 480 ml  Output 400 ml  Net 80 ml     Assessment/Plan:  66 y.o. female is s/p:  1.  Extensive left common femoral, external iliac, profundofemoral and SFA endarterectomy with ligation of proximal left SFA and vein  patch angioplasty of left common femoral artery 2.  Harvest of left greater saphenous vein in the mid thigh 3.  Left common femoral to above-knee popliteal artery bypass with 6 mm ringed PTFE   6 Days Post-Op   -pt with palpable left DP pulse. -left groin with increased drainage that is concerning.  Will get wound cx and start broad spectrum abx given PTFE is present as she is at high risk for infection given body habitus.  -left calf wound appearance unchanged but not as malodorous today -increasing mobilization daily -DVT prophylaxis:  sq heparin -not ready for discharge given worsening left groin   Leontine Locket, PA-C [facility] [phone] 12/12/2021 6:45 AM   I have independently interviewed and examined patient and agree with PA assessment and plan above.  Superficial dehiscence of left groin wound.  Continue local dry dressings and cultures start antibiotics but does not require debridement at this time.  Ayesha Markwell C. Donzetta Matters, MD [facility] [place] Office: [phone] Pager: [phone]

## 2021-12-12 NOTE — Progress Notes (Signed)
Occupational Therapy Treatment Patient Details Name: [name] Houston MRN: [id number] DOB: 02/26/1956 Today's Date: 12/12/2021   History of present illness Pt is 66 yo female who presented on 8/8 with slow healing wound L calf and underwent L common femoral endarterectomy with L FPBG. PMH: DMII, GERD, HLD, HTN, kidney disease, smoker.   OT comments  Pt progressing toward established OT goals. Pt currently requires minA to progress to EOB and modA for return to supine. She requires minguard assistance at [facility] level and demonstrates improved independence with standing from elevated surfaces. Pt reporting pain level 9/10 during OT session. She required minguard assistance for grooming while standing at sink level. Pt will continue to benefit from skilled OT services to maximize safety and independence with ADL/IADL and functional mobility. Will continue to follow acutely and progress as tolerated.     Recommendations for follow up therapy are one component of a multi-disciplinary discharge planning process, led by the attending physician.  Recommendations may be updated based on patient status, additional functional criteria and insurance authorization.    Follow Up Recommendations  Home health OT    Assistance Recommended at Discharge Frequent or constant Supervision/Assistance  Patient can return home with the following  A little help with walking and/or transfers;A lot of help with bathing/dressing/bathroom;Assist for transportation;Help with stairs or ramp for entrance   Equipment Recommendations  BSC/3in1    Recommendations for Other Services      Precautions / Restrictions Precautions Precautions: Fall Restrictions Weight Bearing Restrictions: No       Mobility Bed Mobility Overal bed mobility: Needs Assistance Bed Mobility: Supine to Sit     Supine to sit: Min assist Sit to supine: Mod assist   General bed mobility comments: minA to progress to EOB for assistance  with LLE, attempted to utilize belt as leg lifter to allow pt increased independence with returning to bed, however, pt required increased assistance    Transfers Overall transfer level: Needs assistance Equipment used: Rolling walker (2 wheels) Transfers: Sit to/from Stand Sit to Stand: Min guard           General transfer comment: minguard from elevated surface     Balance Overall balance assessment: Needs assistance Sitting-balance support: Feet supported Sitting balance-Leahy Scale: Fair     Standing balance support: Bilateral upper extremity supported Standing balance-Leahy Scale: Poor Standing balance comment: heavy reliance on BUE support on RW                           ADL either performed or assessed with clinical judgement   ADL Overall ADL's : Needs assistance/impaired     Grooming: Min guard;Standing;Oral care;Wash/dry face Grooming Details (indicate cue type and reason): completed at sink level             Lower Body Dressing: Moderate assistance;Sit to/from stand Lower Body Dressing Details (indicate cue type and reason): increased assistance for access to LE Toilet Transfer: Min guard;Rolling walker (2 wheels);Ambulation           Functional mobility during ADLs: Min guard;Rolling walker (2 wheels) General ADL Comments: Pt requiring increased time and frequent rest breaks due to pain in LLE    Extremity/Trunk Assessment Upper Extremity Assessment Upper Extremity Assessment: Overall WFL for tasks assessed   Lower Extremity Assessment Lower Extremity Assessment: Defer to PT evaluation LLE Deficits / Details: Pain at incision LLE: Unable to fully assess due to pain LLE Sensation: WNL LLE Coordination: decreased  gross motor        Vision   Vision Assessment?: No apparent visual deficits   Perception     Praxis      Cognition Arousal/Alertness: Awake/alert Behavior During Therapy: WFL for tasks assessed/performed Overall  Cognitive Status: Within Functional Limits for tasks assessed                                          Exercises      Shoulder Instructions       General Comments      Pertinent Vitals/ Pain       Pain Assessment Pain Assessment: 0-10 Pain Score: 9  Pain Location: LLE Pain Descriptors / Indicators: Grimacing, Guarding, Sore, Sharp Pain Intervention(s): Limited activity within patient's tolerance, Monitored during session  Home Living                                          Prior Functioning/Environment              Frequency  Min 2X/week        Progress Toward Goals  OT Goals(current goals can now be found in the care plan section)  Progress towards OT goals: Progressing toward goals  Acute Rehab OT Goals Patient Stated Goal: to go home OT Goal Formulation: With patient Time For Goal Achievement: 12/21/21 Potential to Achieve Goals: Good ADL Goals Pt Will Transfer to Toilet: with supervision;ambulating;bedside commode Pt Will Perform Toileting - Clothing Manipulation and hygiene: with min assist;sit to/from stand Additional ADL Goal #1: Pt will increase to x5 mins of OOB ADL tasks in sitting or standing with minA overall. Additional ADL Goal #2: Pt will verbalize 3 energy conservation techniques to incraese activity tolerance.  Plan Discharge plan remains appropriate    Co-evaluation                 AM-PAC OT "6 Clicks" Daily Activity     Outcome Measure   Help from another person eating meals?: None Help from another person taking care of personal grooming?: A Little Help from another person toileting, which includes using toliet, bedpan, or urinal?: A Little Help from another person bathing (including washing, rinsing, drying)?: A Lot Help from another person to put on and taking off regular upper body clothing?: A Little Help from another person to put on and taking off regular lower body clothing?: A  Lot 6 Click Score: 17    End of Session Equipment Utilized During Treatment: Rolling walker (2 wheels)  OT Visit Diagnosis: Unsteadiness on feet (R26.81);Muscle weakness (generalized) (M62.81);Pain Pain - Right/Left: Left Pain - part of body: Leg   Activity Tolerance Patient limited by pain;Patient tolerated treatment well   Patient Left with call bell/phone within reach;in bed;with bed alarm set (bed in chair position)   Nurse Communication Patient requests pain meds        Time: 0076-2263 OT Time Calculation (min): 27 min  Charges: OT General Charges $OT Visit: 1 Visit OT Treatments $Self Care/Home Management : 8--22 mins  Helene Kelp OTR/L Acute Rehabilitation Services Office: [place] 12/12/2021, 12:36 PM

## 2021-12-12 NOTE — Progress Notes (Addendum)
Pharmacy Antibiotic Note  [name] Houston is a 66 y.o. female with PVD and recent popliteal artery bypass w/ PTFE and concern of wound infection with increased drainage.  Pharmacy has been consulted for zosyn and vancomycin dosing. -SCr was 1.27 on 8/9  Plan: -Zosyn 3.375gm IV q8h -Vancomycin 1750 mg IV x1 then '750mg'$  IV q24h (est AUC= 449)  Height: '5\' 4"'$  (162.6 cm) Weight: 89.4 kg (197 lb) IBW/kg (Calculated) : 54.7  Temp (24hrs), Avg:98.6 F (37 C), Min:98.4 F (36.9 C), Max:98.8 F (37.1 C)  Recent Labs  Lab 12/06/21 1439 12/07/21 0559  WBC 10.9* 9.5  CREATININE 1.39* 1.27*    Estimated Creatinine Clearance: 48.5 mL/min (A) (by C-G formula based on SCr of 1.27 mg/dL (H)).    No Known Allergies  Antimicrobials this admission: 8/14 vanc 8/14 zosyn  Dose adjustments this admission:   Microbiology results: 8/14 wound cx  Thank you for allowing pharmacy to be a part of this patient's care.  Hildred Laser, PharmD Clinical Pharmacist **Pharmacist phone directory can now be found on [email or web address] ([email or web address]).  Listed under [place].

## 2021-12-13 ENCOUNTER — Encounter (HOSPITAL_COMMUNITY): Payer: Self-pay | Admitting: Vascular Surgery

## 2021-12-13 ENCOUNTER — Encounter (HOSPITAL_COMMUNITY)
Admission: RE | Disposition: A | Discharge status: Home Health | Payer: Self-pay | Source: Home / Self Care | Attending: Vascular Surgery

## 2021-12-13 ENCOUNTER — Inpatient Hospital Stay (HOSPITAL_COMMUNITY): Payer: HMO | Admitting: Certified Registered Nurse Anesthetist

## 2021-12-13 DIAGNOSIS — E1151 Type 2 diabetes mellitus with diabetic peripheral angiopathy without gangrene: Secondary | ICD-10-CM

## 2021-12-13 DIAGNOSIS — I70262 Atherosclerosis of native arteries of extremities with gangrene, left leg: Secondary | ICD-10-CM

## 2021-12-13 DIAGNOSIS — N289 Disorder of kidney and ureter, unspecified: Secondary | ICD-10-CM | POA: Diagnosis not present

## 2021-12-13 DIAGNOSIS — I10 Essential (primary) hypertension: Secondary | ICD-10-CM

## 2021-12-13 HISTORY — PX: GROIN DEBRIDEMENT: SHX5159

## 2021-12-13 LAB — BASIC METABOLIC PANEL
Anion gap: 10 (ref 5–15)
BUN: 22 mg/dL (ref 8–23)
CO2: 27 mmol/L (ref 22–32)
Calcium: 9 mg/dL (ref 8.9–10.3)
Chloride: 99 mmol/L (ref 98–111)
Creatinine, Ser: 1.33 mg/dL — ABNORMAL HIGH (ref 0.44–1.00)
GFR, Estimated: 45 mL/min — ABNORMAL LOW (ref >60)
Glucose, Bld: 149 mg/dL — ABNORMAL HIGH (ref 70–99)
Potassium: 3.3 mmol/L — ABNORMAL LOW (ref 3.5–5.1)
Sodium: 136 mmol/L (ref 135–145)

## 2021-12-13 LAB — CBC
HCT: 28.4 % — ABNORMAL LOW (ref 36.0–46.0)
Hemoglobin: 9.2 g/dL — ABNORMAL LOW (ref 12.0–15.0)
MCH: 27.2 pg (ref 26.0–34.0)
MCHC: 32.4 g/dL (ref 30.0–36.0)
MCV: 84 fL (ref 80.0–100.0)
Platelets: 402 10*3/uL — ABNORMAL HIGH (ref 150–400)
RBC: 3.38 MIL/uL — ABNORMAL LOW (ref 3.87–5.11)
RDW: 15.2 % (ref 11.5–15.5)
WBC: 8.1 10*3/uL (ref 4.0–10.5)
nRBC: 0 % (ref 0.0–0.2)

## 2021-12-13 LAB — GLUCOSE, CAPILLARY
Glucose-Capillary: 128 mg/dL — ABNORMAL HIGH (ref 70–99)
Glucose-Capillary: 149 mg/dL — ABNORMAL HIGH (ref 70–99)
Glucose-Capillary: 159 mg/dL — ABNORMAL HIGH (ref 70–99)
Glucose-Capillary: 169 mg/dL — ABNORMAL HIGH (ref 70–99)
Glucose-Capillary: 167 mg/dL — ABNORMAL HIGH (ref 70–99)
Glucose-Capillary: 304 mg/dL — ABNORMAL HIGH (ref 70–99)

## 2021-12-13 SURGERY — DEBRIDEMENT, INGUINAL REGION
Anesthesia: General | Laterality: Left

## 2021-12-13 MED ORDER — LABETALOL HCL 5 MG/ML IV SOLN
INTRAVENOUS | Status: AC
  Filled 2021-12-13: qty 4

## 2021-12-13 MED ORDER — PROPOFOL 10 MG/ML IV BOLUS
INTRAVENOUS | Status: DC | PRN
  Administered 2021-12-13: 120 mg via INTRAVENOUS

## 2021-12-13 MED ORDER — FENTANYL CITRATE (PF) 100 MCG/2ML IJ SOLN
INTRAMUSCULAR | Status: AC
  Filled 2021-12-13: qty 2

## 2021-12-13 MED ORDER — PHENYLEPHRINE 80 MCG/ML (10ML) SYRINGE FOR IV PUSH (FOR BLOOD PRESSURE SUPPORT)
PREFILLED_SYRINGE | INTRAVENOUS | Status: DC | PRN
  Administered 2021-12-13: 160 ug via INTRAVENOUS

## 2021-12-13 MED ORDER — ACETAMINOPHEN 500 MG PO TABS
ORAL_TABLET | ORAL | Status: AC
  Filled 2021-12-13: qty 2

## 2021-12-13 MED ORDER — SUGAMMADEX SODIUM 200 MG/2ML IV SOLN
INTRAVENOUS | Status: DC | PRN
  Administered 2021-12-13: 400 mg via INTRAVENOUS

## 2021-12-13 MED ORDER — MIDAZOLAM HCL 2 MG/2ML IJ SOLN
INTRAMUSCULAR | Status: AC
Start: 2021-12-13 — End: ?
  Filled 2021-12-13: qty 2

## 2021-12-13 MED ORDER — DEXAMETHASONE SODIUM PHOSPHATE 10 MG/ML IJ SOLN
INTRAMUSCULAR | Status: AC
  Filled 2021-12-13: qty 2

## 2021-12-13 MED ORDER — ACETAMINOPHEN 500 MG PO TABS: 1000.0000 mg | ORAL_TABLET | Freq: Once | ORAL | Status: DC

## 2021-12-13 MED ORDER — CHLORHEXIDINE GLUCONATE 0.12 % MT SOLN: 15.0000 mL | Freq: Once | OROMUCOSAL | Status: AC

## 2021-12-13 MED ORDER — PHENYLEPHRINE 80 MCG/ML (10ML) SYRINGE FOR IV PUSH (FOR BLOOD PRESSURE SUPPORT)
PREFILLED_SYRINGE | INTRAVENOUS | Status: AC
Start: 2021-12-13 — End: ?
  Filled 2021-12-13: qty 20

## 2021-12-13 MED ORDER — MAGNESIUM SULFATE 50 % IJ SOLN
1.0000 g | Freq: Once | INTRAMUSCULAR | Status: DC
  Filled 2021-12-13: qty 2

## 2021-12-13 MED ORDER — DEXAMETHASONE SODIUM PHOSPHATE 10 MG/ML IJ SOLN
INTRAMUSCULAR | Status: DC | PRN
  Administered 2021-12-13: 4 mg via INTRAVENOUS

## 2021-12-13 MED ORDER — FENTANYL CITRATE (PF) 100 MCG/2ML IJ SOLN
25.0000 ug | INTRAMUSCULAR | Status: DC | PRN
  Administered 2021-12-13 (×3): 50 ug via INTRAVENOUS

## 2021-12-13 MED ORDER — ONDANSETRON HCL 4 MG/2ML IJ SOLN
INTRAMUSCULAR | Status: AC
  Filled 2021-12-13: qty 4

## 2021-12-13 MED ORDER — HEPARIN 6000 UNIT IRRIGATION SOLUTION
Status: DC | PRN
  Administered 2021-12-13: 1

## 2021-12-13 MED ORDER — ORAL CARE MOUTH RINSE: 15.0000 mL | OROMUCOSAL | Status: DC | PRN

## 2021-12-13 MED ORDER — LIDOCAINE 2% (20 MG/ML) 5 ML SYRINGE
INTRAMUSCULAR | Status: DC | PRN
  Administered 2021-12-13: 100 mg via INTRAVENOUS

## 2021-12-13 MED ORDER — ONDANSETRON HCL 4 MG/2ML IJ SOLN
INTRAMUSCULAR | Status: DC | PRN
  Administered 2021-12-13: 4 mg via INTRAVENOUS

## 2021-12-13 MED ORDER — ROCURONIUM BROMIDE 10 MG/ML (PF) SYRINGE
PREFILLED_SYRINGE | INTRAVENOUS | Status: DC | PRN
  Administered 2021-12-13: 80 mg via INTRAVENOUS

## 2021-12-13 MED ORDER — FENTANYL CITRATE (PF) 250 MCG/5ML IJ SOLN
INTRAMUSCULAR | Status: AC
  Filled 2021-12-13: qty 5

## 2021-12-13 MED ORDER — LIDOCAINE 2% (20 MG/ML) 5 ML SYRINGE
INTRAMUSCULAR | Status: AC
  Filled 2021-12-13: qty 10

## 2021-12-13 MED ORDER — ORAL CARE MOUTH RINSE: 15.0000 mL | Freq: Once | OROMUCOSAL | Status: AC

## 2021-12-13 MED ORDER — 0.9 % SODIUM CHLORIDE (POUR BTL) OPTIME
TOPICAL | Status: DC | PRN
  Administered 2021-12-13: 1000 mL

## 2021-12-13 MED ORDER — LACTATED RINGERS IV SOLN: INTRAVENOUS | Status: DC

## 2021-12-13 MED ORDER — SODIUM CHLORIDE 0.9 % IR SOLN
Status: DC | PRN
  Administered 2021-12-13: 3000 mL

## 2021-12-13 MED ORDER — MAGNESIUM SULFATE IN D5W 1-5 GM/100ML-% IV SOLN
1.0000 g | Freq: Once | INTRAVENOUS | Status: DC
  Filled 2021-12-13: qty 100

## 2021-12-13 MED ORDER — ROCURONIUM BROMIDE 10 MG/ML (PF) SYRINGE
PREFILLED_SYRINGE | INTRAVENOUS | Status: AC
  Filled 2021-12-13: qty 20

## 2021-12-13 MED ORDER — MAGNESIUM SULFATE 50 % IJ SOLN
INTRAMUSCULAR | Status: DC | PRN
  Administered 2021-12-13: 1 g via INTRAVENOUS

## 2021-12-13 MED ORDER — MIDAZOLAM HCL 2 MG/2ML IJ SOLN
INTRAMUSCULAR | Status: DC | PRN
  Administered 2021-12-13: 2 mg via INTRAVENOUS

## 2021-12-13 MED ORDER — EPHEDRINE 5 MG/ML INJ
INTRAVENOUS | Status: AC
  Filled 2021-12-13: qty 5

## 2021-12-13 MED ORDER — FENTANYL CITRATE (PF) 250 MCG/5ML IJ SOLN
INTRAMUSCULAR | Status: DC | PRN
  Administered 2021-12-13: 25 ug via INTRAVENOUS
  Administered 2021-12-13: 50 ug via INTRAVENOUS
  Administered 2021-12-13: 25 ug via INTRAVENOUS
  Administered 2021-12-13: 50 ug via INTRAVENOUS
  Administered 2021-12-13 (×2): 25 ug via INTRAVENOUS

## 2021-12-13 MED ORDER — CHLORHEXIDINE GLUCONATE 0.12 % MT SOLN
OROMUCOSAL | Status: AC
  Administered 2021-12-13: 15 mL via OROMUCOSAL
  Filled 2021-12-13: qty 15

## 2021-12-13 MED ORDER — HEPARIN 6000 UNIT IRRIGATION SOLUTION
Status: AC
Start: 2021-12-13 — End: ?
  Filled 2021-12-13: qty 500

## 2021-12-13 SURGICAL SUPPLY — 41 items
BAG COUNTER SPONGE SURGICOUNT (BAG) ×3 IMPLANT
CANISTER SUCT 3000ML PPV (MISCELLANEOUS) ×3 IMPLANT
CANISTER WOUNDNEG PRESSURE 500 (CANNISTER) ×2 IMPLANT
CLIP LIGATING EXTRA MED SLVR (CLIP) ×3 IMPLANT
CLIP LIGATING EXTRA SM BLUE (MISCELLANEOUS) ×3 IMPLANT
COVER SURGICAL LIGHT HANDLE (MISCELLANEOUS) ×3 IMPLANT
DERMABOND ADVANCED (GAUZE/BANDAGES/DRESSINGS) ×3
DERMABOND ADVANCED .7 DNX12 (GAUZE/BANDAGES/DRESSINGS) ×2 IMPLANT
DRAPE HALF SHEET 40X57 (DRAPES) ×2 IMPLANT
DRSG VAC ATS SM SENSATRAC (GAUZE/BANDAGES/DRESSINGS) ×2 IMPLANT
DRSG VERSA FOAM LRG 10X15 (GAUZE/BANDAGES/DRESSINGS) ×2 IMPLANT
ELECT REM PT RETURN 9FT ADLT (ELECTROSURGICAL) ×3
ELECTRODE REM PT RTRN 9FT ADLT (ELECTROSURGICAL) ×2 IMPLANT
GAUZE SPONGE 4X4 12PLY STRL (GAUZE/BANDAGES/DRESSINGS) ×3 IMPLANT
GLOVE BIO SURGEON STRL SZ7.5 (GLOVE) ×3 IMPLANT
GOWN STRL REUS W/ TWL LRG LVL3 (GOWN DISPOSABLE) ×4 IMPLANT
GOWN STRL REUS W/ TWL XL LVL3 (GOWN DISPOSABLE) ×2 IMPLANT
GOWN STRL REUS W/TWL LRG LVL3 (GOWN DISPOSABLE) ×6
GOWN STRL REUS W/TWL XL LVL3 (GOWN DISPOSABLE) ×3
HANDPIECE INTERPULSE COAX TIP (DISPOSABLE) ×3
IV NS IRRIG 3000ML ARTHROMATIC (IV SOLUTION) ×3 IMPLANT
KIT BASIN OR (CUSTOM PROCEDURE TRAY) ×3 IMPLANT
KIT TURNOVER KIT B (KITS) ×3 IMPLANT
NS IRRIG 1000ML POUR BTL (IV SOLUTION) ×6 IMPLANT
PACK GENERAL/GYN (CUSTOM PROCEDURE TRAY) ×3 IMPLANT
PACK PERIPHERAL VASCULAR (CUSTOM PROCEDURE TRAY) ×3 IMPLANT
PACK UNIVERSAL I (CUSTOM PROCEDURE TRAY) ×3 IMPLANT
PAD ARMBOARD 7.5X6 YLW CONV (MISCELLANEOUS) ×6 IMPLANT
SET HNDPC FAN SPRY TIP SCT (DISPOSABLE) ×1 IMPLANT
SUT MNCRL AB 4-0 PS2 18 (SUTURE) ×6 IMPLANT
SUT PROLENE 5 0 C 1 24 (SUTURE) ×3 IMPLANT
SUT PROLENE 6 0 BV (SUTURE) ×3 IMPLANT
SUT SILK 2 0 SH (SUTURE) ×3 IMPLANT
SUT VIC AB 2-0 CT1 27 (SUTURE) ×6
SUT VIC AB 2-0 CT1 TAPERPNT 27 (SUTURE) ×4 IMPLANT
SUT VIC AB 3-0 SH 27 (SUTURE) ×6
SUT VIC AB 3-0 SH 27X BRD (SUTURE) ×4 IMPLANT
TOWEL GREEN STERILE (TOWEL DISPOSABLE) ×3 IMPLANT
TRAY FOLEY MTR SLVR 16FR STAT (SET/KITS/TRAYS/PACK) ×3 IMPLANT
UNDERPAD 30X36 HEAVY ABSORB (UNDERPADS AND DIAPERS) ×3 IMPLANT
WATER STERILE IRR 1000ML POUR (IV SOLUTION) ×3 IMPLANT

## 2021-12-13 NOTE — Op Note (Signed)
    Patient name: [name] Houston MRN: [id number] DOB: 1955/11/13 Sex: female  12/13/2021 Pre-operative Diagnosis: breakdown left groin post surgical wound Post-operative diagnosis:  Same Surgeon:  Erlene Quan C. Donzetta Matters, MD Assistant: Paulo Fruit, PA Procedure Performed:  Washout of left groin post surgical wound with placement of negative pressure dressing.  Indications: 66 year old female recently underwent a left common femoral endarterectomy with vein patch angioplasty followed by femoral to above-knee popliteal artery bypass with PTFE graft.  She now has drainage and foul smell from the left groin is indicated for washout possible replacement of the graft with CryoVein.  Findings: Breakdown of the wound was all superficial all tissue appeared healthy did not require debridement.  The wound was thoroughly irrigated.  The graft appeared well incorporated I confirmed flow with Doppler I could not actually visualize the graft or the common femoral artery.  At completion we placed a white sponge over top of the bypass followed by a black sponge wound VAC.   Procedure:  The patient was identified in the holding area and taken to the operating room where she was placed in bilateral table and general anesthesia was induced.  She was sterilely prepped draped in the left groin and the rest of the left lower extremity usual fashion, antibiotics were up-to-date and timeout was called.  We began by opening her groin incision.  I dissected down where there was some purulent drainage and I thoroughly irrigated this.  The graft appeared to be incorporated I confirmed this with Doppler I did not identify any graft or common femoral artery.  The wound was then thoroughly irrigated with pulse evacuation 3 L of saline.  After this it appeared very clean we obtain hemostasis and then placed a white sponge over where the graft was palpable followed by black sponge wound VAC and hooked this to -125 mmHg suction.  I  then evaluated the wound on her calf this did not appear to need any debridement.  She was then awakened from anesthesia having tolerated procedure without any complication.  All counts were correct at completion.  EBL: 20cc  Devonda Pequignot C. Donzetta Matters, MD [facility] [place] Office: [phone] Pager: [phone]

## 2021-12-13 NOTE — Progress Notes (Signed)
Pt came back to rm 7 from PACU. Reinitiated tele. VSS. Call bell within reach.   Lavenia Atlas, RN

## 2021-12-13 NOTE — Anesthesia Procedure Notes (Signed)
Procedure Name: Intubation Date/Time: 12/13/2021 2:52 PM  Performed by: Darletta Moll, CRNAPre-anesthesia Checklist: Patient identified, Emergency Drugs available, Suction available and Patient being monitored Patient Re-evaluated:Patient Re-evaluated prior to induction Oxygen Delivery Method: Circle system utilized Preoxygenation: Pre-oxygenation with 100% oxygen Induction Type: IV induction Ventilation: Mask ventilation without difficulty Laryngoscope Size: Mac and 3 Grade View: Grade I Tube type: Oral Number of attempts: 1 Airway Equipment and Method: Stylet Placement Confirmation: ETT inserted through vocal cords under direct vision, positive ETCO2 and breath sounds checked- equal and bilateral Secured at: 21 cm Tube secured with: Tape Dental Injury: Teeth and Oropharynx as per pre-operative assessment  Comments: Lynnea Ferrier, SRNA

## 2021-12-13 NOTE — Progress Notes (Addendum)
Progress Note    12/13/2021 6:59 AM 7 Days Post-Op  Subjective:  says she has a little more pain in the left groin  Afebrile HR 60's-90's NSR 818'E-993'Z systolic 16% RA  Vitals:   12/13/21 0439 12/13/21 0641  BP: (!) 106/56   Pulse: 71   Resp: 17 19  Temp: (!) 97.5 F (36.4 C)   SpO2: 97%     Physical Exam: Cardiac:  regular Lungs:  non labored Incisions:  left groin with purulence from distal portion of incision.     Extremities:  palpable left DP pulse   CBC    Component Value Date/Time   WBC 9.5 12/07/2021 0559   RBC 3.59 (L) 12/07/2021 0559   HGB 9.9 (L) 12/07/2021 0559   HCT 30.0 (L) 12/07/2021 0559   PLT 264 12/07/2021 0559   MCV 83.6 12/07/2021 0559   MCH 27.6 12/07/2021 0559   MCHC 33.0 12/07/2021 0559   RDW 15.2 12/07/2021 0559   LYMPHSABS 1.5 07/11/2007 0525   MONOABS 0.8 07/11/2007 0525   EOSABS 0.4 07/11/2007 0525   BASOSABS 0.0 07/11/2007 0525    BMET    Component Value Date/Time   NA 133 (L) 12/12/2021 0935   K 3.5 12/12/2021 0935   CL 98 12/12/2021 0935   CO2 24 12/12/2021 0935   GLUCOSE 200 (H) 12/12/2021 0935   BUN 23 12/12/2021 0935   CREATININE 1.23 (H) 12/12/2021 0935   CALCIUM 8.7 (L) 12/12/2021 0935   GFRNONAA 49 (L) 12/12/2021 0935   GFRAA  07/10/2007 0515    >60        The eGFR has been calculated using the MDRD equation. This calculation has not been validated in all clinical    INR    Component Value Date/Time   INR 1.1 12/02/2021 1332     Intake/Output Summary (Last 24 hours) at 12/13/2021 0659 Last data filed at 12/12/2021 2305 Gross per 24 hour  Intake 696.1 ml  Output 800 ml  Net -103.9 ml   Component 1 d ago  Specimen Description GROIN   Special Requests  SUPERFICIAL, LEFT   Gram Stain NO WBC SEEN  RARE GRAM POSITIVE COCCI  RARE GRAM NEGATIVE RODS  Performed at [facility], [place] [address] [place], [place] [address]   Culture PENDING   Report Status PENDING     Assessment/Plan:  66  y.o. female is s/p:  1.  Extensive left common femoral, external iliac, profundofemoral and SFA endarterectomy with ligation of proximal left SFA and vein patch angioplasty of left common femoral artery 2.  Harvest of left greater saphenous vein in the mid thigh 3.  Left common femoral to above-knee popliteal artery bypass with 6 mm ringed PTFE   7 Days Post-Op   -she does have a palpable left DP pulse.  Unfortunately, despite meticulous wound care, she has developed purulent drainage from incision.  She will go to the OR today for exploration and wash out of left groin.  May possibly need removal of graft, which could result in limb loss.   Dr. Donzetta Matters has discussed this with the pt.  -she had a sip of water at 0630 and otherwise has been npo.  -pt with rare GPC and GNR on gram stain from left groin.  Continue abx until final culture results.  She will need meticulous left groin wound care especially in light of having synthetic graft material present for bypass.  -PT/OT recommending HHPT/OT and and this has been arranged for  when pt discharges -DVT prophylaxis:  sq heparin   Leontine Locket, PA-C [facility] [phone] 12/13/2021 6:59 AM  I have independently interviewed and examined patient and agree with PA assessment and plan above.  Plan will be to return to the OR today for debridement of left groin possibly removal of bypass graft and placement of CryoVein versus staged procedure.  I discussed the risk factors alternatives and she demonstrates good understanding.  I will also evaluate her left leg wound during the surgery.  Samil Mecham C. Donzetta Matters, MD [facility] [place] Office: [phone] Pager: [phone]

## 2021-12-13 NOTE — Transfer of Care (Signed)
Immediate Anesthesia Transfer of Care Note  Patient: [name] Houston  Procedure(s) Performed: Karl Ito (Left) POSSIBLE CRYOVEIN BYPASS  Patient Location: PACU  Anesthesia Type:General  Level of Consciousness: drowsy and patient cooperative  Airway & Oxygen Therapy: Patient Spontanous Breathing and Patient connected to nasal cannula oxygen  Post-op Assessment: Report given to RN, Post -op Vital signs reviewed and stable and Patient moving all extremities X 4  Post vital signs: Reviewed and stable  Last Vitals:  Vitals Value Taken Time  BP 143/82   Temp    Pulse 16   Resp 90   SpO2 95     Last Pain:  Vitals:   12/13/21 0946  TempSrc:   PainSc: 9       Patients Stated Pain Goal: 0 (76/80/88 1103)  Complications: No notable events documented.

## 2021-12-13 NOTE — Anesthesia Preprocedure Evaluation (Addendum)
Anesthesia Evaluation  Patient identified by MRN, date of birth, ID band Patient awake    Reviewed: Allergy & Precautions, NPO status , Patient's Chart, lab work & pertinent test results  Airway Mallampati: I  TM Distance: >3 FB Neck ROM: Full    Dental  (+) Edentulous Upper, Edentulous Lower, Dental Advisory Given   Pulmonary neg pulmonary ROS, former smoker,    Pulmonary exam normal breath sounds clear to auscultation       Cardiovascular hypertension, Pt. on medications + Peripheral Vascular Disease  Normal cardiovascular exam Rhythm:Regular Rate:Normal     Neuro/Psych negative neurological ROS  negative psych ROS   GI/Hepatic Neg liver ROS, hiatal hernia, GERD  ,  Endo/Other  diabetes, Type 2, Oral Hypoglycemic Agents  Renal/GU Renal InsufficiencyRenal diseaseLab Results      Component                Value               Date                      CREATININE               1.33 (H)            12/13/2021                BUN                      22                  12/13/2021                NA                       136                 12/13/2021                K                        3.3 (L)             12/13/2021                CL                       99                  12/13/2021                CO2                      27                  12/13/2021             negative genitourinary   Musculoskeletal negative musculoskeletal ROS (+)   Abdominal   Peds  Hematology  (+) Blood dyscrasia (Hgb 9.2), anemia ,   Anesthesia Other Findings 66 y.o. female POD7 from Extensive left common femoral, external iliac, profundofemoral and SFA endarterectomy with ligation of proximal left SFA and vein patch angioplasty of left common femoral artery now with purulent drainage from groin incision  Reproductive/Obstetrics  Anesthesia Physical Anesthesia Plan  ASA: 3  Anesthesia  Plan: General   Post-op Pain Management: Tylenol PO (pre-op)*   Induction: Intravenous  PONV Risk Score and Plan: 3 and Midazolam, Dexamethasone and Ondansetron  Airway Management Planned: Oral ETT  Additional Equipment:   Intra-op Plan:   Post-operative Plan: Extubation in OR  Informed Consent: I have reviewed the patients History and Physical, chart, labs and discussed the procedure including the risks, benefits and alternatives for the proposed anesthesia with the patient or authorized representative who has indicated his/her understanding and acceptance.     Dental advisory given  Plan Discussed with: CRNA  Anesthesia Plan Comments:         Anesthesia Quick Evaluation

## 2021-12-14 ENCOUNTER — Encounter (HOSPITAL_COMMUNITY): Payer: Self-pay | Admitting: Vascular Surgery

## 2021-12-14 ENCOUNTER — Other Ambulatory Visit: Payer: Self-pay

## 2021-12-14 LAB — GLUCOSE, CAPILLARY
Glucose-Capillary: 246 mg/dL — ABNORMAL HIGH (ref 70–99)
Glucose-Capillary: 171 mg/dL — ABNORMAL HIGH (ref 70–99)
Glucose-Capillary: 159 mg/dL — ABNORMAL HIGH (ref 70–99)
Glucose-Capillary: 201 mg/dL — ABNORMAL HIGH (ref 70–99)

## 2021-12-14 MED ORDER — PHENYLEPHRINE HCL-NACL 20-0.9 MG/250ML-% IV SOLN
INTRAVENOUS | Status: AC
  Filled 2021-12-14: qty 1000

## 2021-12-14 NOTE — Progress Notes (Addendum)
Progress Note    12/14/2021 8:31 AM 1 Day Post-Op  Subjective:  pt sitting up in bed smiling.  Tells me they used the medihoney on her wound.    Afebrile HR 60's-80's NSR 503'U-882'C systolic 00% RA  Vitals:   12/14/21 0611 12/14/21 0824  BP: (!) 126/56 (!) 115/59  Pulse: 67 73  Resp: 16 17  Temp: 98 F (36.7 C) 97.9 F (36.6 C)  SpO2: 97% 99%    Physical Exam: Cardiac:  regular Lungs:  non labored Incisions:  left groin with vac with good seal Extremities:  easily palpable left DP pulse Abdomen:  soft  CBC    Component Value Date/Time   WBC 8.1 12/13/2021 0825   RBC 3.38 (L) 12/13/2021 0825   HGB 9.2 (L) 12/13/2021 0825   HCT 28.4 (L) 12/13/2021 0825   PLT 402 (H) 12/13/2021 0825   MCV 84.0 12/13/2021 0825   MCH 27.2 12/13/2021 0825   MCHC 32.4 12/13/2021 0825   RDW 15.2 12/13/2021 0825   LYMPHSABS 1.5 07/11/2007 0525   MONOABS 0.8 07/11/2007 0525   EOSABS 0.4 07/11/2007 0525   BASOSABS 0.0 07/11/2007 0525    BMET    Component Value Date/Time   NA 136 12/13/2021 0825   K 3.3 (L) 12/13/2021 0825   CL 99 12/13/2021 0825   CO2 27 12/13/2021 0825   GLUCOSE 149 (H) 12/13/2021 0825   BUN 22 12/13/2021 0825   CREATININE 1.33 (H) 12/13/2021 0825   CALCIUM 9.0 12/13/2021 0825   GFRNONAA 45 (L) 12/13/2021 0825   GFRAA  07/10/2007 0515    >60        The eGFR has been calculated using the MDRD equation. This calculation has not been validated in all clinical    INR    Component Value Date/Time   INR 1.1 12/02/2021 1332     Intake/Output Summary (Last 24 hours) at 12/14/2021 0831 Last data filed at 12/14/2021 0700 Gross per 24 hour  Intake 84.68 ml  Output 930 ml  Net -845.32 ml   Specimen Description GROIN   Special Requests  SUPERFICIAL, LEFT   Gram Stain NO WBC SEEN  RARE GRAM POSITIVE COCCI  RARE GRAM NEGATIVE RODS   Culture ABUNDANT SERRATIA MARCESCENS  CULTURE REINCUBATED FOR BETTER GROWTH  Performed at [facility], [place] [address] [place], [place] [address]   Report Status PENDING     Assessment/Plan:  66 y.o. female is s/p:    Washout of left groin post surgical wound with placement of negative pressure dressing.  1 Day Post-Op And 1.  Extensive left common femoral, external iliac, profundofemoral and SFA endarterectomy with ligation of proximal left SFA and vein patch angioplasty of left common femoral artery 2.  Harvest of left greater saphenous vein in the mid thigh 3.  Left common femoral to above-knee popliteal artery bypass with 6 mm ringed PTFE  8 Days Post Op   -continues to have palpable left DP pulse -wound vac with good seal -wound cx growing abundant serratia marcescens-incubated for better growth.  Continue broad spectrum abx until final culture given PTFE.  Pt remains afebrile and no leukocytosis -DVT prophylaxis:  sq heparin -continue mobilizing   Leontine Locket, PA-C [facility] [phone] 12/14/2021 8:31 AM   I have independently interviewed and examined patient and agree with PA assessment and plan above. Continue antibiotics as above.   Carlethia Mesquita C. Donzetta Matters, MD [facility] [place] Office: [phone] Pager: [phone]

## 2021-12-14 NOTE — Progress Notes (Signed)
OT Cancellation Note  Patient Details Name: [name] Houston MRN: [id number] DOB: July 18, 1955   Cancelled Treatment:    Reason Eval/Treat Not Completed: Patient declined, no reason specified Patient declined to participate at this time with visitor in room. Patient reported she would not be available until after PT session at 3:30. OT to continue to follow and check back as schedule will allow.  Jackelyn Poling OTR/L, [place] Acute Rehabilitation Department Office# [phone] Pager# [phone]  12/14/2021, 12:55 PM

## 2021-12-14 NOTE — Progress Notes (Signed)
Physical Therapy Treatment Patient Details Name: [name] Houston MRN: [id number] DOB: 01-20-1956 Today's Date: 12/14/2021   History of Present Illness Pt is 66 yo female who presented on 8/8 with slow healing wound L calf and underwent L common femoral endarterectomy with L FPBG. PMH: DMII, GERD, HLD, HTN, kidney disease, smoker.    PT Comments    Pt received supine and agreeable to session with slow but steady progress this session, however pt continues to be limited by increased pain s/p wound vac placement yesterday. Pt able to self-mobilize LLE to and off EOB without physical assist and come to standing from elevated EOB and BSC over commode with min guard for safety. Pt continues to need min guard for ambulation in room with cues needed for RW proximity and light assist to mange RW especially with turning. Plan to continue to progress gait for increased activity tolerance and LLE strength in next session. Pt continues to benefit from skilled PT services to progress toward functional mobility goals.    Recommendations for follow up therapy are one component of a multi-disciplinary discharge planning process, led by the attending physician.  Recommendations may be updated based on patient status, additional functional criteria and insurance authorization.  Follow Up Recommendations  Home health PT     Assistance Recommended at Discharge Intermittent Supervision/Assistance  Patient can return home with the following A little help with walking and/or transfers;A little help with bathing/dressing/bathroom;Assistance with cooking/housework;Assist for transportation;Help with stairs or ramp for entrance   Equipment Recommendations  Rolling walker (2 wheels)    Recommendations for Other Services       Precautions / Restrictions Precautions Precautions: Fall Restrictions Weight Bearing Restrictions: No     Mobility  Bed Mobility Overal bed mobility: Needs Assistance Bed  Mobility: Supine to Sit     Supine to sit: HOB elevated, Min guard     General bed mobility comments: min guard, pt able to self mobilize LLE to and off EOB without assist    Transfers Overall transfer level: Needs assistance Equipment used: Rolling walker (2 wheels) Transfers: Sit to/from Stand Sit to Stand: Min guard           General transfer comment: close min guard for power up from EOB, pt with decreased weight shift on Lt LE secondary to pain. EOB elevated.    Ambulation/Gait Ambulation/Gait assistance: Min guard Gait Distance (Feet): 30 Feet Assistive device: Rolling walker (2 wheels) Gait Pattern/deviations: Step-to pattern, Decreased stance time - left, Decreased stride length, Antalgic, Decreased weight shift to left Gait velocity: decr     General Gait Details: cautious gait speed and pt with reduced weight shift to Lt due to antalgia and wound vac placed yesterday .min guard and cues intermittently for proximity to RW, assist to turn walker safely.   Stairs             Wheelchair Mobility    Modified Rankin (Stroke Patients Only)       Balance Overall balance assessment: Needs assistance Sitting-balance support: Feet supported Sitting balance-Leahy Scale: Fair     Standing balance support: Bilateral upper extremity supported Standing balance-Leahy Scale: Poor Standing balance comment: heavy reliance on BUE support on RW                            Cognition Arousal/Alertness: Awake/alert Behavior During Therapy: WFL for tasks assessed/performed Overall Cognitive Status: Within Functional Limits for tasks assessed  General Comments: pleasant and eager to progress despite pain.        Exercises      General Comments General comments (skin integrity, edema, etc.): pt up in chair at sink at end of session for bathing      Pertinent Vitals/Pain Pain Assessment Pain Assessment:  0-10 Pain Score: 9  Pain Location: LLE Pain Descriptors / Indicators: Grimacing, Guarding, Sore, Sharp Pain Intervention(s): Premedicated before session, Monitored during session, Limited activity within patient's tolerance    Home Living                          Prior Function            PT Goals (current goals can now be found in the care plan section) Acute Rehab PT Goals Patient Stated Goal: return home PT Goal Formulation: With patient Time For Goal Achievement: 12/20/21    Frequency    Min 3X/week      PT Plan Current plan remains appropriate    Co-evaluation              AM-PAC PT "6 Clicks" Mobility   Outcome Measure  Help needed turning from your back to your side while in a flat bed without using bedrails?: A Little Help needed moving from lying on your back to sitting on the side of a flat bed without using bedrails?: A Little Help needed moving to and from a bed to a chair (including a wheelchair)?: A Little Help needed standing up from a chair using your arms (e.g., wheelchair or bedside chair)?: A Little Help needed to walk in hospital room?: A Little Help needed climbing 3-5 steps with a railing? : A Lot 6 Click Score: 17    End of Session   Activity Tolerance: Patient tolerated treatment well;Patient limited by pain Patient left: in chair (in chair at sink for bathing, RN outside door) Nurse Communication: Mobility status PT Visit Diagnosis: Pain;Difficulty in walking, not elsewhere classified (R26.2) Pain - Right/Left: Left Pain - part of body: Leg     Time: 3817-7116 PT Time Calculation (min) (ACUTE ONLY): 23 min  Charges:  $Gait Training: 8-22 mins $Therapeutic Activity: 8-22 mins                     Malin Sambrano R. PTA Acute Rehabilitation Services Office: [place] 12/14/2021, 4:22 PM

## 2021-12-14 NOTE — Anesthesia Postprocedure Evaluation (Signed)
Anesthesia Post Note  Patient: [name] Houston  Procedure(s) Performed: Karl Ito (Left)     Patient location during evaluation: PACU Anesthesia Type: General Level of consciousness: awake and alert Pain management: pain level controlled Vital Signs Assessment: post-procedure vital signs reviewed and stable Respiratory status: spontaneous breathing, nonlabored ventilation, respiratory function stable and patient connected to nasal cannula oxygen Cardiovascular status: blood pressure returned to baseline and stable Postop Assessment: no apparent nausea or vomiting Anesthetic complications: no   No notable events documented.  Last Vitals:  Vitals:   12/13/21 2344 12/14/21 0611  BP: 124/61 (!) 126/56  Pulse: 80 67  Resp: 20 16  Temp: 36.5 C 36.7 C  SpO2: 100% 97%    Last Pain:  Vitals:   12/14/21 0632  TempSrc:   PainSc: 9    Pain Goal: Patients Stated Pain Goal: 3 (12/13/21 1650)                 [name] L Danyia Borunda

## 2021-12-15 LAB — GLUCOSE, CAPILLARY
Glucose-Capillary: 163 mg/dL — ABNORMAL HIGH (ref 70–99)
Glucose-Capillary: 211 mg/dL — ABNORMAL HIGH (ref 70–99)
Glucose-Capillary: 182 mg/dL — ABNORMAL HIGH (ref 70–99)
Glucose-Capillary: 214 mg/dL — ABNORMAL HIGH (ref 70–99)

## 2021-12-15 MED ORDER — SODIUM CHLORIDE 0.9 % IV SOLN
2.0000 g | Freq: Two times a day (BID) | INTRAVENOUS | Status: DC
  Administered 2021-12-15 – 2021-12-21 (×12): 2 g via INTRAVENOUS
  Filled 2021-12-15 (×12): qty 12.5

## 2021-12-15 NOTE — Progress Notes (Signed)
Mobility Specialist Progress Note:   12/15/21 1415  Mobility  Activity Ambulated with assistance in hallway  Level of Assistance Contact guard assist, steadying assist  Assistive Device Front wheel walker  Distance Ambulated (ft) 65 ft  Activity Response Tolerated well  $Mobility charge 1 Mobility   Pt in bed and agreeable. C/o of LLE pain. Pt in bed with all needs met and call bell in reach.   [name] Houston Mobility Specialist-Acute Rehab Secure Chat only

## 2021-12-15 NOTE — TOC Progression Note (Signed)
Transition of Care (TOC) - Progression Note  Marvetta Gibbons RN, BSN Transitions of Care Unit 4E- RN Case Manager See Treatment Team for direct phone #    Patient Details  Name: Nyella Martinique Codispoti MRN: [id number] Date of Birth: 02/26/1956  Transition of Care [facility]) CM/SW Contact  Dahlia Client, Romeo Rabon, RN Phone Number: 12/15/2021, 2:43 PM  Clinical Narrative:    [employer] home wound VAC order form signed- and has been faxed to [place] for insurance approval. Approval pending at this time- TOC to follow for home wound VAC needs- will need to add HHRN to orders if pt will d/c home with wound [facility]   Expected Discharge Plan: [place] Barriers to Discharge: Continued Medical Work up  Expected Discharge Plan and Services Expected Discharge Plan: [place]   Discharge Planning Services: CM Consult Post Acute Care Choice: Durable Medical Equipment, Home Health Living arrangements for the past 2 months: Single Family Home                 DME Arranged: 3-N-1, Walker rolling DME Agency: [employer] Date DME Agency Contacted: 12/09/21 Time DME Agency Contacted: 1053 Representative spoke with at DME Agency: Jodell Cipro HH Arranged: PT, OT [place] Agency: [place] Date [place]: 12/09/21 Time [place]: 1053 Representative spoke with at [place]: [place] Interventions    Readmission Risk Interventions     No data to display

## 2021-12-15 NOTE — Progress Notes (Signed)
  Progress Note    12/15/2021 7:08 AM 2 Days Post-Op  Subjective: Having some pain in left leg wound  Afebrile HR 50's-70's NSR 725'D-664'Q systolic 03% RA  Vitals:   12/14/21 2352 12/15/21 0418  BP: (!) 125/55 114/71  Pulse: 69 64  Resp: 17 20  Temp: 97.8 F (36.6 C) 98.3 F (36.8 C)  SpO2: 100% 98%    Physical Exam: Awake alert oriented Left lower extremity well-perfused Left groin wound VAC to suction  CBC    Component Value Date/Time   WBC 8.1 12/13/2021 0825   RBC 3.38 (L) 12/13/2021 0825   HGB 9.2 (L) 12/13/2021 0825   HCT 28.4 (L) 12/13/2021 0825   PLT 402 (H) 12/13/2021 0825   MCV 84.0 12/13/2021 0825   MCH 27.2 12/13/2021 0825   MCHC 32.4 12/13/2021 0825   RDW 15.2 12/13/2021 0825   LYMPHSABS 1.5 07/11/2007 0525   MONOABS 0.8 07/11/2007 0525   EOSABS 0.4 07/11/2007 0525   BASOSABS 0.0 07/11/2007 0525    BMET    Component Value Date/Time   NA 136 12/13/2021 0825   K 3.3 (L) 12/13/2021 0825   CL 99 12/13/2021 0825   CO2 27 12/13/2021 0825   GLUCOSE 149 (H) 12/13/2021 0825   BUN 22 12/13/2021 0825   CREATININE 1.33 (H) 12/13/2021 0825   CALCIUM 9.0 12/13/2021 0825   GFRNONAA 45 (L) 12/13/2021 0825   GFRAA  07/10/2007 0515    >60        The eGFR has been calculated using the MDRD equation. This calculation has not been validated in all clinical    INR    Component Value Date/Time   INR 1.1 12/02/2021 1332     Intake/Output Summary (Last 24 hours) at 12/15/2021 0708 Last data filed at 12/15/2021 0500 Gross per 24 hour  Intake 452.71 ml  Output 650 ml  Net -197.29 ml    Component 3 d ago  Specimen Description GROIN   Special Requests  SUPERFICIAL, LEFT   Gram Stain NO WBC SEEN  RARE GRAM POSITIVE COCCI  RARE GRAM NEGATIVE RODS   Culture ABUNDANT SERRATIA MARCESCENS  MODERATE ENTEROBACTER AEROGENES  MODERATE PSEUDOMONAS AERUGINOSA  SUSCEPTIBILITIES TO FOLLOW  Performed at [facility], [place] [address] [place],  [place] [address]   Report Status PENDING      Assessment/Plan:  66 y.o. female is s/p:    Washout of left groin post surgical wound with placement of negative pressure dressing.  2 Days Post-Op And 1.  Extensive left common femoral, external iliac, profundofemoral and SFA endarterectomy with ligation of proximal left SFA and vein patch angioplasty of left common femoral artery 2.  Harvest of left greater saphenous vein in the mid thigh 3.  Left common femoral to above-knee popliteal artery bypass with 6 mm ringed PTFE  9 Days Post Op   -Plan for wound VAC change tomorrow at bedside  -wound cx now with abundant Serratia marcescens, moderate Enterobacter aerogenes, and moderate pseudomonas aeruginosa.  Continue broad spectrum abx until final culture given PTFE.  Remains afebrile.   -DVT prophylaxis:  sq heparin -PT recommending HHPT-this has been arranged.   -will need wound vac for home.     Leontine Locket, PA-C [facility] [phone] 12/15/2021 7:08 AM  Erlene Quan C. Donzetta Matters, MD [facility] [place] Office: [phone] Pager: [phone]

## 2021-12-15 NOTE — Progress Notes (Signed)
Mobility Specialist Progress Note    12/15/21 1145  Mobility  Activity Refused mobility   Pt stated she is in pain and just got up. Will f/u as schedule permits.   Hildred Alamin Mobility Specialist

## 2021-12-15 NOTE — Progress Notes (Signed)
Occupational Therapy Treatment Patient Details Name: [name] Houston MRN: [id number] DOB: 06-07-1955 Today's Date: 12/15/2021   History of present illness Pt is 66 yo female who presented on 8/8 with slow healing wound L calf and underwent L common femoral endarterectomy with L FPBG. PMH: DMII, GERD, HLD, HTN, kidney disease, smoker.   OT comments  Patient stating she is more sore since additional washout surgery.  Continues to participate, and should regain her prior level fairly quickly.  Leg pain is the primary deficit.  OT can continue efforts with [facility] OT recommended post acute.     Recommendations for follow up therapy are one component of a multi-disciplinary discharge planning process, led by the attending physician.  Recommendations may be updated based on patient status, additional functional criteria and insurance authorization.    Follow Up Recommendations  Home health OT    Assistance Recommended at Discharge Frequent or constant Supervision/Assistance  Patient can return home with the following  A little help with walking and/or transfers;A lot of help with bathing/dressing/bathroom;Assist for transportation;Help with stairs or ramp for entrance   Equipment Recommendations  BSC/3in1    Recommendations for Other Services      Precautions / Restrictions Precautions Precautions: Fall Restrictions Weight Bearing Restrictions: No       Mobility Bed Mobility                    Transfers Overall transfer level: Needs assistance Equipment used: Rolling walker (2 wheels) Transfers: Sit to/from Stand Sit to Stand: Supervision, From elevated surface                 Balance Overall balance assessment: Needs assistance Sitting-balance support: Feet supported Sitting balance-Leahy Scale: Good     Standing balance support: Reliant on assistive device for balance Standing balance-Leahy Scale: Fair                             ADL either  performed or assessed with clinical judgement   ADL       Grooming: Standing;Oral care;Wash/dry face;Supervision/safety;Wash/dry hands           Upper Body Dressing : Set up;Sitting   Lower Body Dressing: Moderate assistance;Sit to/from stand   Toilet Transfer: Supervision/safety;Ambulation;Rolling walker (2 wheels)                  Extremity/Trunk Assessment              Vision       Perception     Praxis      Cognition Arousal/Alertness: Awake/alert Behavior During Therapy: WFL for tasks assessed/performed Overall Cognitive Status: Within Functional Limits for tasks assessed                                          Exercises      Shoulder Instructions       General Comments  HR to 104 with mobility    Pertinent Vitals/ Pain       Pain Assessment Pain Assessment: Faces Faces Pain Scale: Hurts little more Pain Location: LLE Pain Descriptors / Indicators: Tightness, Tender, Sore Pain Intervention(s): Monitored during session  Frequency  Min 2X/week        Progress Toward Goals  OT Goals(current goals can now be found in the care plan section)  Progress towards OT goals: Progressing toward goals  Acute Rehab OT Goals OT Goal Formulation: With patient Time For Goal Achievement: 12/21/21 Potential to Achieve Goals: Good  Plan Discharge plan remains appropriate    Co-evaluation                 AM-PAC OT "6 Clicks" Daily Activity     Outcome Measure   Help from another person eating meals?: None Help from another person taking care of personal grooming?: None Help from another person toileting, which includes using toliet, bedpan, or urinal?: A Little Help from another person bathing (including washing, rinsing, drying)?: A Lot Help from another person to put on and taking off regular upper body clothing?: None Help from  another person to put on and taking off regular lower body clothing?: A Lot 6 Click Score: 19    End of Session Equipment Utilized During Treatment: Rolling walker (2 wheels)  OT Visit Diagnosis: Unsteadiness on feet (R26.81);Muscle weakness (generalized) (M62.81);Pain Pain - Right/Left: Left Pain - part of body: Leg   Activity Tolerance Patient tolerated treatment well   Patient Left in bed;with call bell/phone within reach   Nurse Communication Mobility status        Time: 8315-1761 OT Time Calculation (min): 19 min  Charges: OT General Charges $OT Visit: 1 Visit OT Treatments $Self Care/Home Management : 8-22 mins  12/15/2021  RP, OTR/L  Acute Rehabilitation Services  Office:  [phone]   Metta Clines 12/15/2021, 10:56 AM

## 2021-12-16 LAB — GLUCOSE, CAPILLARY
Glucose-Capillary: 169 mg/dL — ABNORMAL HIGH (ref 70–99)
Glucose-Capillary: 218 mg/dL — ABNORMAL HIGH (ref 70–99)
Glucose-Capillary: 224 mg/dL — ABNORMAL HIGH (ref 70–99)
Glucose-Capillary: 155 mg/dL — ABNORMAL HIGH (ref 70–99)

## 2021-12-16 LAB — BASIC METABOLIC PANEL
Anion gap: 10 (ref 5–15)
BUN: 26 mg/dL — ABNORMAL HIGH (ref 8–23)
CO2: 24 mmol/L (ref 22–32)
Calcium: 8.9 mg/dL (ref 8.9–10.3)
Chloride: 102 mmol/L (ref 98–111)
Creatinine, Ser: 1.44 mg/dL — ABNORMAL HIGH (ref 0.44–1.00)
GFR, Estimated: 41 mL/min — ABNORMAL LOW (ref >60)
Glucose, Bld: 169 mg/dL — ABNORMAL HIGH (ref 70–99)
Potassium: 3.8 mmol/L (ref 3.5–5.1)
Sodium: 136 mmol/L (ref 135–145)

## 2021-12-16 LAB — AEROBIC CULTURE W GRAM STAIN (SUPERFICIAL SPECIMEN): Gram Stain: NONE SEEN

## 2021-12-16 NOTE — Progress Notes (Addendum)
[facility] [place]  Subjective  - No changes or new complaints   Objective (!) 115/49 77 98 F (36.7 C) (Oral) 18 99%  Intake/Output Summary (Last 24 hours) at 12/16/2021 1125 Last data filed at 12/16/2021 0335 Gross per 24 hour  Intake 100 ml  Output 950 ml  Net -850 ml    Left groin vac change 8 x 3 x 4 deep cm Left LE perfused m/sensation intact, palpable left DP Lungs non labored breathing   Assessment/Planning: POD # 69  66 y.o. female is s/p:    Washout of left groin post surgical wound with placement of negative pressure dressing.  2 Days Post-Op And 1.  Extensive left common femoral, external iliac, profundofemoral and SFA endarterectomy with ligation of proximal left SFA and vein patch angioplasty of left common femoral artery 2.  Harvest of left greater saphenous vein in the mid thigh 3.  Left common femoral to above-knee popliteal artery bypass with 6 mm ringed PTFE   Pending final cultures currently on Cefepime, Vanc D/C.  Will plan Cipro PO at discharge per Pharmacy.  Groin wound vac to suction.  Pending vac for home She will have next vac change Monday Medihoney to LE left calf wound   Roxy Horseman 12/16/2021 11:25 AM --  Laboratory Lab Results: No results for input(s): "WBC", "HGB", "HCT", "PLT" in the last 72 hours. BMET Recent Labs    12/16/21 0320  NA 136  K 3.8  CL 102  CO2 24  GLUCOSE 169*  BUN 26*  CREATININE 1.44*  CALCIUM 8.9    COAG Lab Results  Component Value Date   INR 1.1 12/02/2021   No results found for: "PTT"   I have interviewed and examined patient with PA and agree with assessment and plan above. Wound vac changed. Will tailor abx.  Jaquelyne Firkus C. Donzetta Matters, MD [facility] [place] Office: [phone] Pager: [phone]

## 2021-12-16 NOTE — Progress Notes (Addendum)
Spoke with pt regarding home wound VAC needs and proper nutrition for adequate wound healing.  Discussed importance of following a well balanced diet and trying to eat well. Including watching her carbs and sugars for blood sugar control as well as making sure she had good protein intake. Discussed eating fruits and vegetables as part of her diet to make sure she is getting proper nutrients.  Pt voiced understanding and expressed that she checks her blood sugars and tries watch her carbs/sugar intake at home. Pt asked appropriate questions for clarification and showed understanding of the need for proper nutrition in order for wound healing.

## 2021-12-16 NOTE — Progress Notes (Signed)
PT Cancellation Note  Patient Details Name: [name] Houston MRN: [id number] DOB: [birth year]   Cancelled Treatment:    Reason Eval/Treat Not Completed: Other (comment) pt politely declining mobility x2 attempts throughout day, AM due to pending wound vac change, PM secondary to pain. Will check back as schedule allows to continue with PT POC.  Audry Riles. PTA Acute Rehabilitation Services Office: [place] 12/16/2021, 3:47 PM

## 2021-12-16 NOTE — Progress Notes (Signed)
Mobility Specialist - Progress Note   12/16/21 1654  Mobility  Activity Refused mobility   Pt refused mobility stating she was still in pain from the wound vac. Will follow up if time permits.     Larey Seat

## 2021-12-16 NOTE — TOC Progression Note (Addendum)
Transition of Care (TOC) - Progression Note  Marvetta Gibbons RN, BSN Transitions of Care Unit 4E- RN Case Manager See Treatment Team for direct phone #    Patient Details  Name: [name] Houston MRN: [id number] Date of Birth: May 16, 1955  Transition of Care [facility]) CM/SW Contact  Dahlia Client, Romeo Rabon, RN Phone Number: 12/16/2021, 10:59 AM  Clinical Narrative:    Received msg from Olivia Mackie at [facility]- she just found out this AM from their intake folks that patient is out of network with them and has no out of net work benefits for home wound VAC- [employer] will not be able to get auth for home wound VAC.  CM made TC to [employer] benefits line and spoke w/ Tammy R (ref # [id number]) pt has an [place] per Tammy that has no OON benefits. [place] services are covered at zero dollar copay. Discussed w/ Tammy DME need for Home wound VAC- Tammy provided a list of DME agencies that are in-network- [employer], [employer], [facility]- asked about [employer] and [employer] which Tammy said were not in net-work. Of the agencies provided will try [employer] to see if they can get approval as the others focus mainly on Respiratory DME.   [place] w/ Zach at [place] for home wound VAC needs- new order form for [employer] will need to be signed- have placed on shadow chart- once faxed back to [employer] Thedore Mins will work on approval- per Thedore Mins this may take several days from past experience.   1100- Call made to M. Collins w/ vascular to inform of barrier at this time w/ home wound VAC- plan for wound VAC drsg change at bedside today-  need wound measurements documented, as well as new order forms signed for [employer] home VAC. TOC to f/u once forms signed and fax back to [employer] to start insurance authorization.   1300- order forms have been faxed to [employer]- measurements noted Home Wound VAC w/ [employer] pending approval   Expected Discharge Plan: [place] Barriers to Discharge: Continued Medical Work up  Expected Discharge  Plan and Services Expected Discharge Plan: [place]   Discharge Planning Services: CM Consult Post Acute Care Choice: Durable Medical Equipment, Home Health Living arrangements for the past 2 months: Single Family Home                 DME Arranged: 3-N-1, Walker rolling DME Agency: [employer] Date DME Agency Contacted: 12/09/21 Time DME Agency Contacted: [id number] Representative spoke with at DME Agency: Jodell Cipro HH Arranged: PT, OT Rodriguez [place] Agency: [place] Date [place]: 12/09/21 Time [place]: 1053 Representative spoke with at [place]: [place] ([place]) Interventions    Readmission Risk Interventions     No data to display

## 2021-12-17 LAB — GLUCOSE, CAPILLARY
Glucose-Capillary: 153 mg/dL — ABNORMAL HIGH (ref 70–99)
Glucose-Capillary: 200 mg/dL — ABNORMAL HIGH (ref 70–99)
Glucose-Capillary: 156 mg/dL — ABNORMAL HIGH (ref 70–99)
Glucose-Capillary: 248 mg/dL — ABNORMAL HIGH (ref 70–99)

## 2021-12-17 MED ORDER — MEDIHONEY WOUND/BURN DRESSING EX PSTE
1.0000 | PASTE | Freq: Every day | CUTANEOUS | Status: DC
  Administered 2021-12-18 – 2021-12-21 (×4): 1 via TOPICAL
  Filled 2021-12-17: qty 44

## 2021-12-17 NOTE — Progress Notes (Addendum)
[facility] [place]  Subjective  - left groin sore   Objective (!) 138/91 62 97.8 F (36.6 C) (Oral) 14 100%  Intake/Output Summary (Last 24 hours) at 12/17/2021 0858 Last data filed at 12/17/2021 0500 Gross per 24 hour  Intake 120 ml  Output 900 ml  Net -780 ml    Left groin wound vac to suction Palpable DP left foot Lungs non labored breathing  Assessment/Planning: POD# 74 66 y.o. female is s/p:    Washout of left groin post surgical wound with placement of negative pressure dressing.  2 Days Post-Op And 1.  Extensive left common femoral, external iliac, profundofemoral and SFA endarterectomy with ligation of proximal left SFA and vein patch angioplasty of left common femoral artery 2.  Harvest of left greater saphenous vein in the mid thigh 3.  Left common femoral to above-knee popliteal artery bypass with 6 mm ringed PTFE   Pending final cultures currently on Cefepime, Vanc D/C.  Will plan Cipro PO at discharge per Pharmacy.  Groin wound vac to suction.  Pending vac for home She will have next vac change Monday Medihoney to LE left calf wound  Roxy Horseman 12/17/2021 8:58 AM --  Laboratory Lab Results: No results for input(s): "WBC", "HGB", "HCT", "PLT" in the last 72 hours. BMET Recent Labs    12/16/21 0320  NA 136  K 3.8  CL 102  CO2 24  GLUCOSE 169*  BUN 26*  CREATININE 1.44*  CALCIUM 8.9    COAG Lab Results  Component Value Date   INR 1.1 12/02/2021   No results found for: "PTT"  VASCULAR STAFF ADDENDUM: I have independently interviewed and examined the patient. I agree with the above.   Yevonne Aline. Stanford Breed, MD [facility] Phone Number: [phone] 12/17/2021 4:18 PM

## 2021-12-17 NOTE — Progress Notes (Signed)
Mobility Specialist: Progress Note   12/17/21 1649  Mobility  Activity Ambulated with assistance in hallway  Level of Assistance Contact guard assist, steadying assist  Assistive Device Front wheel walker  Distance Ambulated (ft) 70 ft  Activity Response Tolerated well  $Mobility charge 1 Mobility   Pt received in the bed and requesting to use BSC, BM successful. Pt assisted with pericare and agreeable to mobility, no c/o throughout. Pt back to bed with call bell and phone at her side.   [facility] Borghild Thaker Mobility Specialist Mobility Specialist 4 [place]: [phone]

## 2021-12-17 NOTE — Progress Notes (Signed)
Physical Therapy Treatment Patient Details Name: [name] Houston MRN: [id number] DOB: Jan 16, 1956 Today's Date: 12/17/2021   History of Present Illness Pt is 66 yo female who presented on 8/8 with slow healing wound L calf and underwent L common femoral endarterectomy with L FPBG. PMH: DMII, GERD, HLD, HTN, kidney disease, smoker.    PT Comments    Pt received supine, eager and motivated for OOB mobility this session. Pt making continued progress towards goals with increased ambulation tolerance this session, despite pain secondary to wound vac change yesterday. Pt continues to require min assist for bed mobility to manage LLE into bed. Continued education re; benefits of continued ambulation and activity recommendations with pt verbalizing understanding and receptive. Pt continues to benefit from skilled PT services to progress toward functional mobility goals.    Recommendations for follow up therapy are one component of a multi-disciplinary discharge planning process, led by the attending physician.  Recommendations may be updated based on patient status, additional functional criteria and insurance authorization.  Follow Up Recommendations  Home health PT     Assistance Recommended at Discharge Intermittent Supervision/Assistance  Patient can return home with the following A little help with walking and/or transfers;A little help with bathing/dressing/bathroom;Assistance with cooking/housework;Assist for transportation;Help with stairs or ramp for entrance   Equipment Recommendations  Rolling walker (2 wheels)    Recommendations for Other Services       Precautions / Restrictions Precautions Precautions: Fall Restrictions Weight Bearing Restrictions: No     Mobility  Bed Mobility Overal bed mobility: Needs Assistance Bed Mobility: Supine to Sit, Sit to Supine     Supine to sit: HOB elevated, Min guard Sit to supine: Min assist, HOB elevated   General bed mobility  comments: min guard, pt able to self mobilize LLE to and off EOB without assist, min asssit to bring LE back into bed    Transfers Overall transfer level: Needs assistance Equipment used: Rolling walker (2 wheels) Transfers: Sit to/from Stand Sit to Stand: Supervision, From elevated surface           General transfer comment: no physical assist needed, EOB elevate dper pt request    Ambulation/Gait Ambulation/Gait assistance: Min guard Gait Distance (Feet): 80 Feet Assistive device: Rolling walker (2 wheels) Gait Pattern/deviations: Step-to pattern, Decreased stance time - left, Decreased stride length, Antalgic, Decreased weight shift to left Gait velocity: decr     General Gait Details: cautious gait speed and pt with reduced weight shift to Lt due to antalgia and wound vac. min guard and cues intermittently for proximity to RW.   Stairs             Wheelchair Mobility    Modified Rankin (Stroke Patients Only)       Balance Overall balance assessment: Needs assistance Sitting-balance support: Feet supported Sitting balance-Leahy Scale: Good     Standing balance support: Reliant on assistive device for balance Standing balance-Leahy Scale: Fair Standing balance comment: heavy reliance on BUE support on RW                            Cognition Arousal/Alertness: Awake/alert Behavior During Therapy: WFL for tasks assessed/performed Overall Cognitive Status: Within Functional Limits for tasks assessed                                 General Comments: pleasant and eager to progress  despite pain.        Exercises      General Comments        Pertinent Vitals/Pain Pain Assessment Pain Assessment: Faces Faces Pain Scale: Hurts little more Pain Location: LLE Pain Descriptors / Indicators: Tightness, Tender, Sore Pain Intervention(s): Monitored during session, Limited activity within patient's tolerance    Home Living                           Prior Function            PT Goals (current goals can now be found in the care plan section) Acute Rehab PT Goals Patient Stated Goal: return home PT Goal Formulation: With patient Time For Goal Achievement: 12/20/21    Frequency    Min 3X/week      PT Plan Current plan remains appropriate    Co-evaluation              AM-PAC PT "6 Clicks" Mobility   Outcome Measure  Help needed turning from your back to your side while in a flat bed without using bedrails?: A Little Help needed moving from lying on your back to sitting on the side of a flat bed without using bedrails?: A Little Help needed moving to and from a bed to a chair (including a wheelchair)?: A Little Help needed standing up from a chair using your arms (e.g., wheelchair or bedside chair)?: A Little Help needed to walk in hospital room?: A Little Help needed climbing 3-5 steps with a railing? : A Lot 6 Click Score: 17    End of Session Equipment Utilized During Treatment: Gait belt Activity Tolerance: Patient tolerated treatment well Patient left: in bed;with call bell/phone within reach Nurse Communication: Mobility status PT Visit Diagnosis: Pain;Difficulty in walking, not elsewhere classified (R26.2) Pain - Right/Left: Left Pain - part of body: Leg     Time: 9983-3825 PT Time Calculation (min) (ACUTE ONLY): 28 min  Charges:  $Gait Training: 23-37 mins                     Audry Riles. PTA Acute Rehabilitation Services Office: [place] 12/17/2021, 12:36 PM

## 2021-12-18 LAB — GLUCOSE, CAPILLARY
Glucose-Capillary: 171 mg/dL — ABNORMAL HIGH (ref 70–99)
Glucose-Capillary: 259 mg/dL — ABNORMAL HIGH (ref 70–99)
Glucose-Capillary: 210 mg/dL — ABNORMAL HIGH (ref 70–99)
Glucose-Capillary: 147 mg/dL — ABNORMAL HIGH (ref 70–99)

## 2021-12-18 NOTE — Progress Notes (Signed)
Pt up in chair.  IV requested due to c/o discomfort with LA PIV.  Attempted 1x.  Will attempt once pt back in bed.  No meds due at this time.  RN to enter consult once pt returns to bed.  Pt verbalizes understanding and agreement.

## 2021-12-18 NOTE — Progress Notes (Addendum)
[facility] [place]  Subjective  - Doing well   Objective 130/60 68 98.2 F (36.8 C) (Oral) 16 100%  Intake/Output Summary (Last 24 hours) at 12/18/2021 0724 Last data filed at 12/18/2021 0336 Gross per 24 hour  Intake 480 ml  Output 400 ml  Net 80 ml    Right groin wound vac to good suction.  No new drainage from yesterday in vac container. Left LE with palpable DP pulse, motor intact Lungs non labored breathing   Assessment/Planning: POD # 10 66 y.o. female is s/p:    Washout of left groin post surgical wound with placement of negative pressure dressing.  2 Days Post-Op And 1.  Extensive left common femoral, external iliac, profundofemoral and SFA endarterectomy with ligation of proximal left SFA and vein patch angioplasty of left common femoral artery 2.  Harvest of left greater saphenous vein in the mid thigh 3.  Left common femoral to above-knee popliteal artery bypass with 6 mm ringed PTFE   Component 6 d ago  Specimen Description GROIN   Special Requests  SUPERFICIAL, LEFT   Gram Stain NO WBC SEEN  RARE GRAM POSITIVE COCCI  RARE GRAM NEGATIVE RODS  Performed at [facility], [place] [address] [place], [place] [address]   Culture ABUNDANT SERRATIA MARCESCENS  MODERATE ENTEROBACTER AEROGENES  MODERATE PSEUDOMONAS AERUGINOSA   Report Status 12/16/2021 FINAL   Organism ID, Bacteria SERRATIA MARCESCENS   Organism ID, Bacteria ENTEROBACTER AEROGENES   Organism ID, Bacteria PSEUDOMONAS AERUGINOSA      currently on Cefepime, Vanc D/C.  Will plan Cipro PO at discharge per Pharmacy.  Groin wound vac to suction.  Pending vac for home She will have next vac change Monday Medihoney to LE left calf wound  Vac sponge ordered to bedside for vac change tomorrow.  Roxy Horseman 12/18/2021 7:24 AM --  Laboratory Lab Results: No results for input(s): "WBC", "HGB", "HCT", "PLT" in the last 72 hours. BMET Recent Labs     12/16/21 0320  NA 136  K 3.8  CL 102  CO2 24  GLUCOSE 169*  BUN 26*  CREATININE 1.44*  CALCIUM 8.9    COAG Lab Results  Component Value Date   INR 1.1 12/02/2021   No results found for: "PTT"  VASCULAR STAFF ADDENDUM: I have independently interviewed and examined the patient. I agree with the above.   Yevonne Aline. Stanford Breed, MD [facility] Phone Number: [phone] 12/18/2021 2:34 PM

## 2021-12-19 LAB — GLUCOSE, CAPILLARY
Glucose-Capillary: 160 mg/dL — ABNORMAL HIGH (ref 70–99)
Glucose-Capillary: 240 mg/dL — ABNORMAL HIGH (ref 70–99)
Glucose-Capillary: 123 mg/dL — ABNORMAL HIGH (ref 70–99)
Glucose-Capillary: 211 mg/dL — ABNORMAL HIGH (ref 70–99)

## 2021-12-19 NOTE — Consult Note (Signed)
[place] Nurse wound follow up Patient receiving care in [facility] 4E07 POD #13 last vac dressing change Friday 8/18 by Dr. Donzetta Matters.  Wound type: surgical Measurement: 8 cm x 2.5 cm x 3.5 cm Wound bed: beefy red and friable Drainage (amount, consistency, odor) Serosanguinous in canister Periwound: intact Dressing procedure/placement/frequency: Removed one piece of black foam dressing. Used adhesive remover wipes to assist with drape removal. Replaced one piece of black foam, drape applied and immediate suction obtained at 125 mmHg  WOC will FU with MWF dressing changes until patient is approved for SNF placement   1 medium black foam dressing kit on nightstand in the room. Premedicate prior to dressing change. Very painful for the patient.   Cathlean Marseilles Tamala Julian, MSN, RN, [place], Lysle Pearl, [facility] Wound Treatment Associate Pager [phone]

## 2021-12-19 NOTE — Progress Notes (Addendum)
[facility]ists of   Subjective  - No new complaints    Objective (!) 134/41 68 98.4 F (36.9 C) (Oral) 12 100%  Intake/Output Summary (Last 24 hours) at 12/19/2021 7169 Last data filed at 12/19/2021 0800 Gross per 24 hour  Intake 0 ml  Output 952 ml  Net -952 ml    Left groin with vac to good seal Left LE palpable DP pulse Post calf dressing clean and dry Lungs non labored breathing  Assessment/Planning: POD # 80 66 y.o. female is s/p:   12/13/21  Washout of left groin post surgical wound with placement of negative pressure dressing.   12/06/21 1.  Extensive left common femoral, external iliac, profundofemoral and SFA endarterectomy with ligation of proximal left SFA and vein patch angioplasty of left common femoral artery 2.  Harvest of left greater saphenous vein in the mid thigh 3.  Left common femoral to above-knee popliteal artery bypass with 6 mm ringed PTFE      Component 6 d ago  Specimen Description GROIN   Special Requests  SUPERFICIAL, LEFT   Gram Stain NO WBC SEEN  RARE GRAM POSITIVE COCCI  RARE GRAM NEGATIVE RODS  Performed at [facility], [place] [address], [place], [place] [address]   Culture ABUNDANT SERRATIA MARCESCENS  MODERATE ENTEROBACTER AEROGENES  MODERATE PSEUDOMONAS AERUGINOSA   Report Status 12/16/2021 FINAL   Organism ID, Bacteria SERRATIA MARCESCENS   Organism ID, Bacteria ENTEROBACTER AEROGENES   Organism ID, Bacteria PSEUDOMONAS AERUGINOSA       currently on Cefepime, Vanc D/C.  Will plan Cipro PO at discharge per Pharmacy.  Groin wound vac to suction.  Pending vac for home or SNF with insurance issues.  Medihoney to LE left calf wound  Patient now -planning on SNF placement.  She states there will be no one home for care during the day.  TOC alerted by orders today. Vac change later today.  Roxy Horseman 12/19/2021 9:42 AM --  Laboratory Lab Results: No results for input(s): "WBC", "HGB",  "HCT", "PLT" in the last 72 hours. BMET No results for input(s): "NA", "K", "CL", "CO2", "GLUCOSE", "BUN", "CREATININE", "CALCIUM" in the last 72 hours.  COAG Lab Results  Component Value Date   INR 1.1 12/02/2021   No results found for: "PTT"

## 2021-12-19 NOTE — Progress Notes (Signed)
OT Cancellation Note  Patient Details Name: [name] Houston MRN: [id number] DOB: 06/02/1955   Cancelled Treatment:    Reason Eval/Treat Not Completed: Patient declined, no reason specified;Other (comment) Pt just got back to bed, planning to eat breakfast and awaiting wound vac change. Will follow up for OT session as schedule permits.   Layla Maw 12/19/2021, 8:33 AM

## 2021-12-19 NOTE — Progress Notes (Signed)
PT Cancellation Note  Patient Details Name: [name] Houston MRN: [id number] DOB: January 08, 1956   Cancelled Treatment:    Reason Eval/Treat Not Completed: Other (comment), pt politely declining mobility, stating pain from wound vac change. Will re-attempt tomorrow to continue with PT POC.    Betsey Holiday Helmi Hechavarria 12/19/2021, 2:41 PM

## 2021-12-19 NOTE — Progress Notes (Signed)
OT Cancellation Note  Patient Details Name: Starlena Martinique Morro MRN: [id number] DOB: 1956/02/25   Cancelled Treatment:    Reason Eval/Treat Not Completed: Patient declined, no reason specified;Other (comment) Have attempted OT session 2 additional times today with pt still declining d/t awaiting wound vac change. Pt also unsure if she will participate after wound vac change today. Will follow up for OT tomorrow.  Layla Maw 12/19/2021, 1:40 PM

## 2021-12-20 LAB — GLUCOSE, CAPILLARY
Glucose-Capillary: 152 mg/dL — ABNORMAL HIGH (ref 70–99)
Glucose-Capillary: 208 mg/dL — ABNORMAL HIGH (ref 70–99)
Glucose-Capillary: 201 mg/dL — ABNORMAL HIGH (ref 70–99)
Glucose-Capillary: 164 mg/dL — ABNORMAL HIGH (ref 70–99)

## 2021-12-20 NOTE — Progress Notes (Signed)
Physical Therapy Treatment Patient Details Name: [name] Houston MRN: [id number] DOB: 12/05/1955 Today's Date: 12/20/2021   History of Present Illness Pt is 66 yo female who presented on 8/8 with slow healing wound L calf and underwent L common femoral endarterectomy with L FPBG. PMH: DMII, GERD, HLD, HTN, kidney disease, smoker.    PT Comments    Pt received supine and agreeable to session with good progress towards goals this sessio. Pt demonstrating increased gait tolerance with min guard for safety with pt able to progress to a more fluid step-through pattern, pt needing light cues at start for more narrow BOS with pt able to correct and maintain. Pt able to complete all bed mobility and transfers at supervision level and without physical assist. Plan to review stair training next session. Current plan remains appropriate to address deficits and maximize functional independence and safety, with pt endorsing adequate assist at home from friends and spouse. Pt continues to benefit from skilled PT services to progress toward functional mobility goals.    Recommendations for follow up therapy are one component of a multi-disciplinary discharge planning process, led by the attending physician.  Recommendations may be updated based on patient status, additional functional criteria and insurance authorization.  Follow Up Recommendations  Home health PT     Assistance Recommended at Discharge Intermittent Supervision/Assistance  Patient can return home with the following A little help with walking and/or transfers;A little help with bathing/dressing/bathroom;Assistance with cooking/housework;Assist for transportation;Help with stairs or ramp for entrance   Equipment Recommendations  Rolling walker (2 wheels)    Recommendations for Other Services       Precautions / Restrictions Precautions Precautions: Fall Restrictions Weight Bearing Restrictions: No     Mobility  Bed  Mobility Overal bed mobility: Needs Assistance Bed Mobility: Supine to Sit, Sit to Supine     Supine to sit: HOB elevated, Min guard Sit to supine: Min assist, HOB elevated   General bed mobility comments: min guard, pt able to self mobilize LLE to and off EOB without assist, min asssit to bring LE back into bed    Transfers Overall transfer level: Needs assistance Equipment used: Rolling walker (2 wheels) Transfers: Sit to/from Stand Sit to Stand: Supervision, From elevated surface           General transfer comment: no physical assist needed from EOB and BSC, EOB eslightly elevated pt request    Ambulation/Gait Ambulation/Gait assistance: Min guard Gait Distance (Feet): 125 Feet Assistive device: Rolling walker (2 wheels) Gait Pattern/deviations: Step-to pattern, Decreased stance time - left, Decreased stride length, Antalgic, Decreased weight shift to left, Step-through pattern Gait velocity: decr     General Gait Details: improved gait speed with more fluid mechanics, light cues at start for more narrow BOS, pt progressing to step-through pattern   Stairs Stairs: Yes Stairs assistance: Min assist Stair Management: Step to pattern, Forwards, No rails Number of Stairs: 1 General stair comments: practicing sequence for stepping up into home through door/threshold, pt able to verbally recall sequence for ascent/descent of stairs   Wheelchair Mobility    Modified Rankin (Stroke Patients Only)       Balance Overall balance assessment: Needs assistance Sitting-balance support: Feet supported Sitting balance-Leahy Scale: Good     Standing balance support: Reliant on assistive device for balance Standing balance-Leahy Scale: Fair Standing balance comment: reliance on BUE support on RW  Cognition Arousal/Alertness: Awake/alert Behavior During Therapy: WFL for tasks assessed/performed Overall Cognitive Status: Within  Functional Limits for tasks assessed                                 General Comments: pleasant and eager to progress despite pain.        Exercises      General Comments General comments (skin integrity, edema, etc.): VSS on RA, increased time spent discussing HHPT with pt in agreement and endorsing friends and spouse avaliable to help at home      Pertinent Vitals/Pain Pain Assessment Pain Assessment: 0-10 Pain Score: 7  Pain Location: LLE Pain Descriptors / Indicators: Tightness, Tender, Sore Pain Intervention(s): Monitored during session, Limited activity within patient's tolerance    Home Living                          Prior Function            PT Goals (current goals can now be found in the care plan section) Acute Rehab PT Goals Patient Stated Goal: return home PT Goal Formulation: With patient Time For Goal Achievement: 12/20/21    Frequency    Min 3X/week      PT Plan Current plan remains appropriate    Co-evaluation              AM-PAC PT "6 Clicks" Mobility   Outcome Measure  Help needed turning from your back to your side while in a flat bed without using bedrails?: A Little Help needed moving from lying on your back to sitting on the side of a flat bed without using bedrails?: A Little Help needed moving to and from a bed to a chair (including a wheelchair)?: A Little Help needed standing up from a chair using your arms (e.g., wheelchair or bedside chair)?: A Little Help needed to walk in hospital room?: A Little Help needed climbing 3-5 steps with a railing? : A Little 6 Click Score: 18    End of Session Equipment Utilized During Treatment: Gait belt Activity Tolerance: Patient tolerated treatment well Patient left: with call bell/phone within reach;in chair Nurse Communication: Mobility status PT Visit Diagnosis: Pain;Difficulty in walking, not elsewhere classified (R26.2) Pain - Right/Left: Left Pain -  part of body: Leg     Time: 1010-1040 PT Time Calculation (min) (ACUTE ONLY): 30 min  Charges:  $Gait Training: 8-22 mins $Therapeutic Activity: 8-22 mins                    Jocelyne Reinertsen R. PTA Acute Rehabilitation Services Office: [place] 12/20/2021, 10:52 AM

## 2021-12-20 NOTE — Progress Notes (Addendum)
[facility] [place]  Subjective  - Still having pain in the left groin   Objective 127/75 62 98.3 F (36.8 C) (Oral) 17 99%  Intake/Output Summary (Last 24 hours) at 12/20/2021 0802 Last data filed at 12/19/2021 2100 Gross per 24 hour  Intake 600 ml  Output 0 ml  Net 600 ml    Vac to good suction left groin Vac OP 0 cc last 24 hours since change Palpable DP pulse left LE  Assessment/Planning: POD # 48 66 y.o. female is s/p:   12/13/21  [place] of left groin post surgical wound with placement of negative pressure dressing.    12/06/21 1.  Extensive left common femoral, external iliac, profundofemoral and SFA endarterectomy with ligation of proximal left SFA and vein patch angioplasty of left common femoral artery 2.  Harvest of left greater saphenous vein in the mid thigh 3.  Left common femoral to above-knee popliteal artery bypass with 6 mm ringed PTFE   Well perfused with palpable DP left LE     Component 6 d ago  Specimen Description GROIN   Special Requests  SUPERFICIAL, LEFT   Gram Stain NO WBC SEEN  RARE GRAM POSITIVE COCCI  RARE GRAM NEGATIVE RODS  Performed at [facility], [place] [address] [place], [place] [address]   Culture ABUNDANT SERRATIA MARCESCENS  MODERATE ENTEROBACTER AEROGENES  MODERATE PSEUDOMONAS AERUGINOSA   Report Status 12/16/2021 FINAL   Organism ID, Bacteria SERRATIA MARCESCENS   Organism ID, Bacteria ENTEROBACTER AEROGENES   Organism ID, Bacteria PSEUDOMONAS AERUGINOSA         currently on Cefepime, Vanc D/C.  Will plan Cipro PO at discharge per Pharmacy.  Groin wound vac to suction.  Pending vac for home due to insurance issues Cont mobility with PT/OT Plan for [facility] PT/OT and RN once vac approved.     Roxy Horseman 12/20/2021 8:02 AM --  Laboratory Lab Results: No results for input(s): "WBC", "HGB", "HCT", "PLT" in the last 72 hours. BMET No results for input(s): "NA", "K", "CL", "CO2",  "GLUCOSE", "BUN", "CREATININE", "CALCIUM" in the last 72 hours.  COAG Lab Results  Component Value Date   INR 1.1 12/02/2021   No results found for: "PTT"   I have independently interviewed and examined patient and agree with PA assessment and plan above.   Jarome Trull C. Donzetta Matters, MD [facility] [place] Office: [phone] Pager: [phone]

## 2021-12-20 NOTE — TOC Progression Note (Addendum)
Transition of Care (TOC) - Progression Note  Marvetta Gibbons RN, BSN Transitions of Care Unit 4E- RN Case Manager See Treatment Team for direct phone #    Patient Details  Name: [name] Houston MRN: [id number] Date of Birth: 10/10/1955  Transition of Care [facility]) CM/SW Contact  Dahlia Client, Romeo Rabon, RN Phone Number: 12/20/2021, 2:14 PM  Clinical Narrative:    [employer] Home wound VAC auth still pending. CM will check again in AM to see if VAC has been approved.  Noted referral regarding possible SNF? Due to pt not having assistance at home.  CM spoke with pt at bedside to discuss transition plans again. Pt voiced that her spouse would be returning to work and she would be alone for part of the day. Husband works 3-10pm- otherwise husband will be at home w/ pt. Pt voiced she would prefer to return home but was worried she needed someone with her when husband is not there if she needs someone. States she has other family members that can check in on her during the time she is alone. Reviewed current therapy recommendations and discussed with pt that insurance would not approve a rehab stay with current recommendations- pt voiced understanding. CM has spoken with PT/OT and await them to see pt and address concerns to make sure current plan remains the appropriate plan.   [facility] following for [facility] needs if plan remains to return home.   CM to f/u in am after therapy sees pt.    Expected Discharge Plan: [place] Barriers to Discharge: Continued Medical Work up  Expected Discharge Plan and Services Expected Discharge Plan: [place]   Discharge Planning Services: CM Consult Post Acute Care Choice: Durable Medical Equipment, Home Health Living arrangements for the past 2 months: Single Family Home                 DME Arranged: 3-N-1, Walker rolling DME Agency: [employer] Date DME Agency Contacted: 12/09/21 Time DME Agency Contacted:  1053 Representative spoke with at DME Agency: Jodell C[facility] HH Arranged: PT, OT [place] Agency: [place] Date [place]: 12/09/21 Time [place]: 1053 Representative spoke with at [place]: [place] ([place]) Interventions    Readmission Risk Interventions     No data to display

## 2021-12-20 NOTE — TOC Progression Note (Addendum)
Transition of Care (TOC) - Progression Note  Marvetta Gibbons RN, BSN Transitions of Care Unit 4E- RN Case Manager See Treatment Team for direct phone #    Patient Details  Name: [name] Houston MRN: [id number] Date of Birth: March 04, 1956  Transition of Care [facility]) CM/SW Contact  Dahlia Client, Romeo Rabon, RN Phone Number: 12/20/2021, 4:30 PM  Clinical Narrative:    Received notice from [place]- home wound VAC has been approved- can deliver today pending speaking w/ patient regarding copay.  Msg sent to Lattie Haw w/ [facility] to see when they can do start of care for home vac drsg needs this week. - Awaiting confirmation.   1500- [facility] confirmed that they can do start of care for wound VAC drsg tomorrow if needed- home wound VAC pending delivery.  Msg sent to [facility]. [place] as well as page to see if they want to d/c today vs tomorrow- awaiting return call.   1615- have not heard back from vascular regarding transition home plans, spoke with pt at bedside- she states she would prefer to do drsg change here tomorrow and then d/c home - will alert [facility] that [facility] need will be for Friday- and we will plan to do VAC change here tomorrow and then d/c.  1645- spoke with [facility] on call- regarding transition plan- per [facility] agree w/ keeping overnight and doing VAC drsg here tomorrow prior to d/c.  Pt updated at bedside  [facility] to deliver home [facility] to bedside   Expected Discharge Plan: [place] Barriers to Discharge: Continued Medical Work up  Expected Discharge Plan and Services Expected Discharge Plan: [place]   Discharge Planning Services: CM Consult Post Acute Care Choice: Durable Medical Equipment, [place] arrangements for the past 2 months: Single Family Home                 DME Arranged: 3-N-1, Walker rolling DME Agency: [employer] Date DME Agency Contacted: 12/09/21 Time DME Agency Contacted: 1053 Representative spoke with at DME  Agency: [facility]ell [facility] HH Arranged: PT, OT HH Agency: [place] Date [place]: 12/09/21 Time [place]: 1053 Representative spoke with at [place]: [place] ([place]) Interventions    Readmission Risk Interventions     No data to display

## 2021-12-21 ENCOUNTER — Other Ambulatory Visit (HOSPITAL_COMMUNITY): Payer: Self-pay

## 2021-12-21 LAB — GLUCOSE, CAPILLARY
Glucose-Capillary: 158 mg/dL — ABNORMAL HIGH (ref 70–99)
Glucose-Capillary: 180 mg/dL — ABNORMAL HIGH (ref 70–99)

## 2021-12-21 MED ORDER — CIPROFLOXACIN HCL 500 MG PO TABS
500.0000 mg | ORAL_TABLET | Freq: Two times a day (BID) | ORAL | 0 refills | Status: DC
  Filled 2021-12-21: qty 22, 11d supply, fill #0

## 2021-12-21 MED ORDER — OXYCODONE-ACETAMINOPHEN 5-325 MG PO TABS
1.0000 | ORAL_TABLET | Freq: Four times a day (QID) | ORAL | 0 refills | Status: DC | PRN
Start: 2021-12-21 — End: 2021-12-27
  Filled 2021-12-21: qty 30, 8d supply, fill #0

## 2021-12-21 MED ORDER — CIPROFLOXACIN HCL 500 MG PO TABS
500.0000 mg | ORAL_TABLET | Freq: Two times a day (BID) | ORAL | Status: DC
  Administered 2021-12-21: 500 mg via ORAL
  Filled 2021-12-21: qty 1

## 2021-12-21 NOTE — Anesthesia Postprocedure Evaluation (Signed)
Anesthesia Post Note  Patient: Mahkayla Martinique Lynes  Procedure(s) Performed: LEFT COMMON FEMORAL ENDARTERECTOMY (Left) LEFT COMMON FEMORAL-BELOW KNEE POPLITEAL ARTERY BYPASS (Left)     Patient location during evaluation: PACU Anesthesia Type: General Level of consciousness: awake and alert Pain management: pain level controlled Vital Signs Assessment: post-procedure vital signs reviewed and stable Respiratory status: spontaneous breathing, nonlabored ventilation, respiratory function stable and patient connected to nasal cannula oxygen Cardiovascular status: blood pressure returned to baseline and stable Postop Assessment: no apparent nausea or vomiting Anesthetic complications: no   No notable events documented.  Last Vitals:  Vitals:   12/20/21 2357 12/21/21 0413  BP: (!) 148/64 (!) 133/56  Pulse: 74 74  Resp: 17 18  Temp: 36.8 C 36.6 C  SpO2: 100% 99%    Last Pain:  Vitals:   12/21/21 0639  TempSrc:   PainSc: 2    Pain Goal: Patients Stated Pain Goal: 2 (12/16/21 1145)                 [place]

## 2021-12-21 NOTE — Progress Notes (Signed)
Physical Therapy Treatment Patient Details Name: [name] Houston MRN: [id number] DOB: 01-Aug-1955 Today's Date: 12/21/2021   History of Present Illness Pt is 66 yo female who presented on 8/8 with slow healing wound L calf and underwent L common femoral endarterectomy with L FPBG. PMH: DMII, GERD, HLD, HTN, kidney disease, smoker.    PT Comments    Received pt sitting upright in bed requesting time to eat breakfast. Upon therapist's return pt agreeable to PT treatment and reported increased pain in LLE from having wound vac changed. Pt transferred semi-reclined<>sitting EOB with HOB elevated and use of bedrails with supervision and increased time and transferred on/off bedside commode with RW and supervision. Pt able to void but with no BM - able to perform hygiene management standing without assist. Pt then ambulated 28f in hallway with RW and supervision with total A to manage wound vac. Pt verbalized confidence in returning home today and equipment delivered to room. Acute PT to cont to follow.     Recommendations for follow up therapy are one component of a multi-disciplinary discharge planning process, led by the attending physician.  Recommendations may be updated based on patient status, additional functional criteria and insurance authorization.  Follow Up Recommendations  Home health PT     Assistance Recommended at Discharge Intermittent Supervision/Assistance  Patient can return home with the following A little help with walking and/or transfers;A little help with bathing/dressing/bathroom;Assistance with cooking/housework;Assist for transportation;Help with stairs or ramp for entrance   Equipment Recommendations  Rolling walker (2 wheels)    Recommendations for Other Services       Precautions / Restrictions Precautions Precautions: Fall Precaution Comments: wound vac Restrictions Weight Bearing Restrictions: No     Mobility  Bed Mobility Overal bed mobility:  Needs Assistance Bed Mobility: Supine to Sit     Supine to sit: HOB elevated, Supervision     General bed mobility comments: pt required increased time but did not require any physical assist to advance LEs off bed Patient Response: Cooperative  Transfers   Equipment used: Rolling walker (2 wheels) Transfers: Sit to/from Stand, Bed to chair/wheelchair/BSC Sit to Stand: Supervision, From elevated surface Stand pivot transfers: Supervision         General transfer comment: pt transferred bed<>bedside commode with RW and supervision - elevated EOB per pt request    Ambulation/Gait Ambulation/Gait assistance: Supervision Gait Distance (Feet): 80 Feet Assistive device: Rolling walker (2 wheels) Gait Pattern/deviations: Step-to pattern, Decreased step length - right, Decreased step length - left, Decreased stance time - left, Decreased stride length, Decreased weight shift to left, Antalgic, Trunk flexed, Narrow base of support Gait velocity: decreased Gait velocity interpretation: <1.31 ft/sec, indicative of household ambulator   General Gait Details: pt with very slow cadence and with difficulty achieving step through gait pattern due to pain   Stairs             Wheelchair Mobility    Modified Rankin (Stroke Patients Only)       Balance Overall balance assessment: Needs assistance Sitting-balance support: Feet supported, No upper extremity supported Sitting balance-Leahy Scale: Good     Standing balance support: Bilateral upper extremity supported, Reliant on assistive device for balance (RW) Standing balance-Leahy Scale: Fair Standing balance comment: able to maintain static and dynamic standing balance with BUE support on RW and supervision  Cognition Arousal/Alertness: Awake/alert Behavior During Therapy: WFL for tasks assessed/performed Overall Cognitive Status: Within Functional Limits for tasks assessed                                  General Comments: pleasant and eager to progress despite pain.        Exercises      General Comments General comments (skin integrity, edema, etc.): Pt having increased pain this morning from having wound vac changed. Pt's family arrived during session and RW and bedside commode delivered to room in preparation for discharge.      Pertinent Vitals/Pain Pain Assessment Pain Assessment: 0-10 Pain Score: 9  Pain Location: LLE Pain Descriptors / Indicators: Tightness, Sore, Discomfort, Grimacing, Guarding Pain Intervention(s): Limited activity within patient's tolerance, Monitored during session, Premedicated before session, Repositioned    Home Living                          Prior Function            PT Goals (current goals can now be found in the care plan section) Acute Rehab PT Goals Patient Stated Goal: return home PT Goal Formulation: With patient Time For Goal Achievement: 12/20/21 Potential to Achieve Goals: Good Progress towards PT goals: Progressing toward goals    Frequency    Min 3X/week      PT Plan Current plan remains appropriate    Co-evaluation              AM-PAC PT "6 Clicks" Mobility   Outcome Measure  Help needed turning from your back to your side while in a flat bed without using bedrails?: A Little Help needed moving from lying on your back to sitting on the side of a flat bed without using bedrails?: A Little Help needed moving to and from a bed to a chair (including a wheelchair)?: A Little Help needed standing up from a chair using your arms (e.g., wheelchair or bedside chair)?: A Little Help needed to walk in hospital room?: A Little Help needed climbing 3-5 steps with a railing? : A Little 6 Click Score: 18    End of Session   Activity Tolerance: Patient tolerated treatment well Patient left: in chair;with call bell/phone within reach;with family/visitor present Nurse  Communication: Mobility status PT Visit Diagnosis: Pain;Difficulty in walking, not elsewhere classified (R26.2);Muscle weakness (generalized) (M62.81) Pain - Right/Left: Left Pain - part of body: Leg     Time: 0930-1008 PT Time Calculation (min) (ACUTE ONLY): 38 min  Charges:  $Gait Training: 8-22 mins $Therapeutic Activity: 23-37 mins                     Becky Sax PT, DPT  Blenda Nicely 12/21/2021, 10:15 AM

## 2021-12-21 NOTE — TOC Transition Note (Signed)
Transition of Care (TOC) - CM/SW Discharge Note Marvetta Gibbons RN, BSN Transitions of Care Unit 4E- RN Case Manager See Treatment Team for direct phone #    Patient Details  Name: [name] Houston MRN: [id number] Date of Birth: 04/10/1956  Transition of Care [facility]) CM/SW Contact:  Dawayne Patricia, RN Phone Number: 12/21/2021, 10:34 AM   Clinical Narrative:    CM followed up w/ [employer] this am regarding home wound VAC delivery- per Thedore Mins with [employer] home VAC is on it's way now to be delivered to room and should be at the bedside shortly.  Bedside RN will need to convert VAC over to home [facility] prior to discharge.  [facility] following for [facility] needs (RN/PT/OT)  and plan is for [facility] visit on Friday 8/25 for wound VAC drsg change. CM notified Lattie Haw of d/c home today for start of care needs.   DME- RW and 3n1 have already been delivered to room previously-   Per pt family to transport home.    Final next level of care: [place] Barriers to Discharge: Barriers Resolved, Equipment Delay   Patient Goals and CMS Choice Patient states their goals for this hospitalization and ongoing recovery are:: return home CMS Medicare.gov Compare Post Acute Care list provided to:: Patient Choice offered to / list presented to : Patient  Discharge Placement               Home w/ [facility]        Discharge Plan and Services   Discharge Planning Services: CM Consult Post Acute Care Choice: Durable Medical Equipment, Home Health          DME Arranged: Vac DME Agency: [employer] Date DME Agency Contacted: 12/16/21 Time DME Agency Contacted: [id number] Representative spoke with at DME Agency: Thedore Mins HH Arranged: RN, PT, OT [facility] Agency: [place] Date [place]: 12/20/21 Time [place]: 1053 Representative spoke with at [place]: [place] ([place]) Interventions     Readmission Risk Interventions    12/21/2021   10:34 AM   Readmission Risk Prevention Plan  Post Dischage Appt Complete  Medication Screening Complete  Transportation Screening Complete

## 2021-12-21 NOTE — Progress Notes (Signed)
12/21/2021 12:17 PM Discharge AVS meds taken today and those due this evening reviewed.  Follow-up appointments and when to call md reviewed.  D/C IV and TELE.  Questions and concerns addressed.   D/C home per orders.  [place]

## 2021-12-21 NOTE — Progress Notes (Signed)
[facility] [place]  Subjective  - no new complaints   Objective (!) 133/56 74 97.8 F (36.6 C) (Oral) 18 99%  Intake/Output Summary (Last 24 hours) at 12/21/2021 0700 Last data filed at 12/21/2021 0609 Gross per 24 hour  Intake 540 ml  Output 1375 ml  Net -835 ml      Appears to be healing well wound vac replaced Palpable DP pulse    Assessment/Planning: POD # [id number] 66 y.o. female is s/p:   12/13/21  Washout of left groin post surgical wound with placement of negative pressure dressing.    12/06/21 1.  Extensive left common femoral, external iliac, profundofemoral and SFA endarterectomy with ligation of proximal left SFA and vein patch angioplasty of left common femoral artery 2.  Harvest of left greater saphenous vein in the mid thigh 3.  Left common femoral to above-knee popliteal artery bypass with 6 mm ringed PTFE   Well perfused with palpable DP left LE     Component 6 d ago  Specimen Description GROIN   Special Requests  SUPERFICIAL, LEFT   Gram Stain NO WBC SEEN  RARE GRAM POSITIVE COCCI  RARE GRAM NEGATIVE RODS  Performed at [facility], [place] [address], [place],  [address]   Culture ABUNDANT SERRATIA MARCESCENS  MODERATE ENTEROBACTER AEROGENES  MODERATE PSEUDOMONAS AERUGINOSA   Report Status 12/16/2021 FINAL   Organism ID, Bacteria SERRATIA MARCESCENS   Organism ID, Bacteria ENTEROBACTER AEROGENES   Organism ID, Bacteria PSEUDOMONAS AERUGINOSA         currently on Cefepime, Vanc for 10 days D/C.  Will plan Cipro 500 mg BID for 11 more days PO at discharge per Pharmacy recommendation.  Groin wound vac to suction.   Plan to change vac today prior to discharge home Plan for [facility] PT/OT and RN once vac approved. F/U in 2 weeks for incision check    Roxy Horseman 12/21/2021 7:00 AM --  Laboratory Lab Results: No results for input(s): "WBC", "HGB", "HCT", "PLT" in the last 72 hours. BMET No results for  input(s): "NA", "K", "CL", "CO2", "GLUCOSE", "BUN", "CREATININE", "CALCIUM" in the last 72 hours.  COAG Lab Results  Component Value Date   INR 1.1 12/02/2021   No results found for: "PTT"

## 2021-12-21 NOTE — Consult Note (Signed)
[facility] Nurse wound follow up Patient receiving care in [facility] POD #15 Susette Racer. [place], PA-C present for dressing removal. Patient to be discharged today, pending arrival of home vac machine. Photo taken today by Bailey Mech.  Wound type: surgical Wound bed: beefy red and friable Drainage (amount, consistency, odor) Serosanguinous in canister Periwound: intact Dressing procedure/placement/frequency: Removed one piece of black foam dressing. Used adhesive remover spray to assist with drape removal. Replaced one piece of black foam, lined border of wound with ring barrier for better seal and to help prevent leakage. Drape applied and immediate suction obtained at 125 mmHg   WOC will FU with MWF dressing changes. Will see Friday if still inpatient.    Premedicated prior to dressing change.    Cathlean Marseilles Tamala Julian, MSN, RN, [place], Lysle Pearl, [facility] Wound Treatment Associate Pager [phone]

## 2021-12-22 NOTE — Discharge Summary (Signed)
[facility] Discharge Summary   Patient ID:  [name] Houston MRN: [id number] DOB/AGE: [id number] 64 y.o.  Admit date: 12/06/2021 Discharge date: 12/21/21 Date of Surgery: 12/13/2021 Surgeon: Surgeon(s): Waynetta Sandy, MD  Admission Diagnosis: Critical lower limb ischemia [facility]) [I70.229]  Discharge Diagnoses:  Critical lower limb ischemia [facility]) [I70.229]  Secondary Diagnoses: Past Medical History:  Diagnosis Date   Diabetes mellitus without complication ([place])    GERD (gastroesophageal reflux disease)    Hiatal hernia 05/01/2013   DG Esophagus    Hyperlipidemia    Hypertension    Kidney disease 05/02/2011    Procedure(s): GROIN WASHOUT  Discharged Condition: good  HPI: [name] Houston is a 66 y.o. female without significant vascular history.  In the last month she has a wound on the left posterior calf for which she is seeing the wound care center placing [facility].  She underwent angiography which demonstrated extensive left common femoral disease with occlusion of her SFA reconstitution of above-knee popliteal artery with three-vessel runoff to the foot.  She is now indicated for endarterectomy with bypass.   Hospital Course:  [name] Houston is a 66 y.o. female is S/P  Procedure(s): 12/06/21 Procedure Performed: 1.  Extensive left common femoral, external iliac, profundofemoral and SFA endarterectomy with ligation of proximal left SFA and vein patch angioplasty of left common femoral artery 2.  Harvest of left greater saphenous vein in the mid thigh 3.  Left common femoral to above-knee popliteal artery bypass with 6 mm ringed PTFE  12/13/21 GROIN WASHOUT due to purulent drainage at groin incision. brisk multiphasic left DP/PT doppler signals Day 7 post op she developed increased left groin pain with drainage.  Returned to OR for irrigation and debridement of the left groin.  Findings: Breakdown of the wound was all  superficial all tissue appeared healthy did not require debridement.  The wound was thoroughly irrigated.  The graft appeared well incorporated I confirmed flow with Doppler I could not actually visualize the graft or the common femoral artery.  At completion we placed a white sponge over top of the bypass followed by a black sponge wound VAC.  -wound cx now with abundant Serratia marcescens, moderate Enterobacter aerogenes, and moderate pseudomonas aeruginosa.  Continue broad spectrum abx until final culture given PTFE.  Remains afebrile.  Left groin vac change 8 x 3 x 4 deep cm.  Pending final cultures currently on Cefepime, Vanc D/C.  Will plan Cipro PO at discharge per Pharmacy.  Discharged with [facility] for Vac care, as well as PT/OT.    F/U arranged for 2 weeks.   Significant Diagnostic Studies: CBC Lab Results  Component Value Date   WBC 8.1 12/13/2021   HGB 9.2 (L) 12/13/2021   HCT 28.4 (L) 12/13/2021   MCV 84.0 12/13/2021   PLT 402 (H) 12/13/2021    BMET    Component Value Date/Time   NA 136 12/16/2021 0320   K 3.8 12/16/2021 0320   CL 102 12/16/2021 0320   CO2 24 12/16/2021 0320   GLUCOSE 169 (H) 12/16/2021 0320   BUN 26 (H) 12/16/2021 0320   CREATININE 1.44 (H) 12/16/2021 0320   CALCIUM 8.9 12/16/2021 0320   GFRNONAA 41 (L) 12/16/2021 0320   GFRAA  07/10/2007 0515    >60        The eGFR has been calculated using the MDRD equation. This calculation has not been validated in all clinical   COAG Lab Results  Component Value Date  INR 1.1 12/02/2021     Disposition:  Discharge to :Home Discharge Instructions     Call MD for:  redness, tenderness, or signs of infection (pain, swelling, bleeding, redness, odor or green/yellow discharge around incision site)   Complete by: As directed    Call MD for:  severe or increased pain, loss or decreased feeling  in affected limb(s)   Complete by: As directed    Call MD for:  temperature >100.5   Complete by: As directed     Discharge instructions   Complete by: As directed    Keep vac clean and dry you may sponge bath as needed   Resume previous diet   Complete by: As directed       Allergies as of 12/21/2021       Reactions   Morphine Nausea And Vomiting        Medication List     TAKE these medications    acetaminophen 500 MG tablet Commonly known as: TYLENOL Take 1,000 mg by mouth every 6 (six) hours as needed for moderate pain.   amLODipine 10 MG tablet Commonly known as: NORVASC Take 10 mg by mouth daily.   aspirin 81 MG tablet Take 81 mg by mouth daily.   atorvastatin 40 MG tablet Commonly known as: LIPITOR Take 40 mg by mouth daily.   B-12-SL 1000 MCG Subl Generic drug: Cyanocobalamin Place 1,000 mcg under the tongue daily.   chlorthalidone 25 MG tablet Commonly known as: HYGROTON Take 25 mg by mouth daily.   cholecalciferol 25 MCG (1000 UNIT) tablet Commonly known as: VITAMIN D3 Take 1,000 Units by mouth daily.   ciprofloxacin 500 MG tablet Commonly known as: CIPRO Take 1 tablet (500 mg total) by mouth 2 (two) times daily.   famotidine 40 MG tablet Commonly known as: PEPCID Take 40 mg by mouth See admin instructions. Take 40 mg daily, may take a second 40 mg dose as needed for heartburn   Farxiga 10 MG Tabs tablet Generic drug: dapagliflozin propanediol Take 10 mg by mouth daily.   glipiZIDE 5 MG tablet Commonly known as: GLUCOTROL Take 5 mg by mouth 2 (two) times daily.   Magnesium 250 MG Tabs Take 250 mg by mouth 2 (two) times daily.   oxyCODONE-acetaminophen 5-325 MG tablet Commonly known as: PERCOCET/ROXICET Take 1 tablet by mouth every 6 (six) hours as needed for severe pain. What changed: Another medication with the same name was added. Make sure you understand how and when to take each.   oxyCODONE-acetaminophen 5-325 MG tablet Commonly known as: PERCOCET/ROXICET Take 1 tablet by mouth every 6 (six) hours as needed for moderate pain. What changed:  You were already taking a medication with the same name, and this prescription was added. Make sure you understand how and when to take each.   Ozempic (0.25 or 0.5 MG/DOSE) 2 MG/3ML Sopn Generic drug: Semaglutide(0.25 or 0.5MG /DOS) Take 0.5 mg by mouth every Thursday.   pioglitazone 15 MG tablet Commonly known as: ACTOS Take 15 mg by mouth daily.   potassium chloride SA 20 MEQ tablet Commonly known as: KLOR-CON M Take 20 mEq by mouth daily.       Verbal and written Discharge instructions given to the patient. Wound care per Discharge AVS  Follow-up [facility]. Follow up.   Why: HHPT/OT arranged- they will contact you to schedule home visits post discharge Contact information: [place] [address]  [facility] Follow up.   Why: ([facility])- Rolling walker and 3n1 arranged- to be delivered to room prior to discharge Contact information: [place] [address] [phone]         Waynetta Sandy, MD Follow up in 2 week(s).   Specialties: Vascular Surgery, Cardiology Why: Office will call you to arrange your appt (sent) Contact information: [place] [address] [phone]                 Signed: Roxy Horseman 12/22/2021, 11:50 AM - For VQI Registry use --- Instructions: Press F2 to tab through selections.  Delete question if not applicable.   Post-op:  Wound infection: Yes  Graft infection: No  Transfusion: No  If yes, 0 units given New Arrhythmia: No Ipsilateral amputation: [ x] no, $Remo'[ ]'zMfCA$  Minor, $Remov'[ ]'JooSzP$  BKA, $Rem'[ ]'nTDq$  AKA Discharge patency: [ x] Primary, $RemoveBef'[ ]'KhAquBxyjn$  Primary assisted, $RemoveBeforeDEI'[ ]'adGteeFBEAvMqmyT$  Secondary, $RemoveBef'[ ]'xaGZHSUFQc$  Occluded Patency judged by: [x ] Dopper only, $RemoveBefo'[ ]'jnBPjenrfxv$  Palpable graft pulse, $RemoveBefo'[ ]'jLRbRYxCbcX$  Palpable distal pulse, $RemoveBefor'[ ]'FoskzwBngfoF$  ABI inc. > 0.15, $Remov'[ ]'zyKazG$  Duplex   Complications: MI: [x ] No, $Rem'[ ]'XtxB$  Troponin only, $RemoveBefore'[ ]'YBKWjKHbmTZoU$  EKG or Clinical CHF: No Resp failure: [ x] none, [  ] Pneumonia, $RemoveBefo'[ ]'IwWgfhvcJYw$  Ventilator Chg in renal function: [x ] none, $Remov'[ ]'mFyHzB$  Inc. Cr > 0.5, $Remo'[ ]'izmrd$  Temp. Dialysis, $RemoveBeforeD'[ ]'bYCltRnShsmNeu$  Permanent dialysis Stroke: [x ] None, $Remov'[ ]'hJDaou$  Minor, $Remov'[ ]'qOeGyz$  Major Return to OR: Yes  Reason for return to OR: $Remo'[ ]'CCRSI$  Bleeding, [x ] Infection, $RemoveBefo'[ ]'XyobEJMadmQ$  Thrombosis, $RemoveBefo'[ ]'MyXhQrXuhfy$  Revision  Discharge medications: Statin use:  Yes ASA use:  Yes Plavix use:  No  for medical reason not indicated Beta blocker use: No  for medical reason not indicated Coumadin use: No  for medical reason not indicated

## 2021-12-23 ENCOUNTER — Telehealth: Payer: Self-pay | Admitting: Physician Assistant

## 2021-12-23 ENCOUNTER — Telehealth: Payer: Self-pay

## 2021-12-23 NOTE — Telephone Encounter (Signed)
Appt has been scheduled.

## 2021-12-23 NOTE — Telephone Encounter (Signed)
Recevied call from [place], [place] at [facility] advising that her start of care was suppose to be today but the nurse is unable to go out d/t a personal emergency. Lattie Haw asked if pt could come into the office for a wound vac change today and they would start care on Monday. After speaking with Angie, RN here in the office she was willing to change wound vac today in office. Lattie Haw called back to report that pt has refused to come into the office for the vac change. Lattie Haw stated they will start care on tomorrow.

## 2021-12-23 NOTE — Telephone Encounter (Signed)
-----   Message from Ulyses Amor, [place] sent at 12/21/2021  7:46 AM EDT ----- F/u in 2 weeks on a Weds. For vac change and incision s/p bypass and left groin wound please remind patient to bring vac supplies

## 2021-12-26 ENCOUNTER — Telehealth: Payer: Self-pay

## 2021-12-26 NOTE — Telephone Encounter (Signed)
Marita Kansas RN with [phone] [facility] called stating that the pt was experiencing significant pain with the wound vac changes to her L groin. She was asking if it could be changed to a wet-to-dry dressing or iodoform, or hydrofera blue.  Reviewed pt's chart, returned call for clarification, two identifiers used. Marita Kansas stated that the wound vac had not drained much from the past few days and the wound bed was granulating well.  Spoke with Gwenette Greet, [place] who insisted that the wound vac be continued d/t to the pt's wound and surgical hx. Called Kristi to [employer] and have her take as many measures as possible to ensure a less painful vac change. Confirmed understanding.

## 2021-12-26 NOTE — Telephone Encounter (Signed)
Pt called stating that she needed a prescription refill.  Reviewed pt's chart, returned call for clarification, two identifiers used. Pt requested pain med refill. Pt has 5 pills remaining, but is having increased pain with wound vac changes and PT. Instructed pt to supplement with OTC meds and her request would be sent to a provider. Pt's pharmacy is [facility]. Confirmed understanding.

## 2021-12-27 ENCOUNTER — Other Ambulatory Visit: Payer: Self-pay | Admitting: Physician Assistant

## 2021-12-27 ENCOUNTER — Telehealth: Payer: Self-pay

## 2021-12-27 DIAGNOSIS — I10 Essential (primary) hypertension: Secondary | ICD-10-CM | POA: Diagnosis not present

## 2021-12-27 DIAGNOSIS — I70262 Atherosclerosis of native arteries of extremities with gangrene, left leg: Secondary | ICD-10-CM

## 2021-12-27 DIAGNOSIS — T8450XA Infection and inflammatory reaction due to unspecified internal joint prosthesis, initial encounter: Secondary | ICD-10-CM | POA: Diagnosis not present

## 2021-12-27 DIAGNOSIS — D119 Benign neoplasm of major salivary gland, unspecified: Secondary | ICD-10-CM | POA: Diagnosis not present

## 2021-12-27 DIAGNOSIS — I70229 Atherosclerosis of native arteries of extremities with rest pain, unspecified extremity: Secondary | ICD-10-CM | POA: Diagnosis not present

## 2021-12-27 MED ORDER — OXYCODONE-ACETAMINOPHEN 5-325 MG PO TABS: 1.0000 | ORAL_TABLET | Freq: Four times a day (QID) | ORAL | 0 refills | Status: DC | PRN

## 2021-12-27 NOTE — Telephone Encounter (Signed)
Pt called stating that she hadn't received her medication refill.  Spoke with Gwenette Greet, [place] and script was sent to pharmacy.  Reviewed pt's chart, returned call, informed pt that med had been refilled and for her to call the pharmacy to arrange pickup. Confirmed understanding.

## 2021-12-28 DIAGNOSIS — E785 Hyperlipidemia, unspecified: Secondary | ICD-10-CM | POA: Diagnosis not present

## 2021-12-28 DIAGNOSIS — N183 Chronic kidney disease, stage 3 unspecified: Secondary | ICD-10-CM | POA: Diagnosis not present

## 2021-12-28 DIAGNOSIS — E1169 Type 2 diabetes mellitus with other specified complication: Secondary | ICD-10-CM | POA: Diagnosis not present

## 2022-01-03 ENCOUNTER — Telehealth: Payer: Self-pay

## 2022-01-03 NOTE — Telephone Encounter (Signed)
Lenward Chancellor RN with [facility] called stating that the pt has significant pain with wound vac changes. The pt is coming into the office tomorrow for a wound vac change, but she took it off at the pt's request. The pt stated it hurt too badly to be removed in the office. Angel placed a wet-to-dry dressing on it, stated that there was not much drainage, and felt that it may be appropriate to leave the vac off. Instructed her to ensure that the pt brought all of their wound vac supplies in case it needed to be placed again. Pt has bag packed to bring. Confirmed understanding.

## 2022-01-04 ENCOUNTER — Ambulatory Visit (INDEPENDENT_AMBULATORY_CARE_PROVIDER_SITE_OTHER): Payer: HMO | Admitting: Physician Assistant

## 2022-01-04 DIAGNOSIS — I739 Peripheral vascular disease, unspecified: Secondary | ICD-10-CM

## 2022-01-04 MED ORDER — OXYCODONE-ACETAMINOPHEN 5-325 MG PO TABS: 1.0000 | ORAL_TABLET | Freq: Four times a day (QID) | ORAL | 0 refills | Status: DC | PRN

## 2022-01-04 NOTE — Progress Notes (Signed)
POST OPERATIVE OFFICE NOTE    CC:  F/u for surgery  HPI: [name] Houston is a 66 year old female who is s/p left groin washout with placement of wound VAC on 12/13/2021 by Dr. Donzetta Matters.  The patient recently underwent left common femoral, external iliac, profundofemoral and SFA endarterectomy with ligation of proximal left SFA and vein patch angioplasty of left common femoral artery.  Also left common femoral to above-knee popliteal artery bypass with PTFE, on 12/06/2021.  Postop the patient developed dehiscence of the left groin wound and infection that required the washout.  Since then the patient has had a home health nurse doing left groin wound VAC changes 3 times weekly, and she was on a oral course of ciprofloxacin.  Pt returns today for follow up.  Pt states she has had a lot of pain in her left groin incision, especially with wound VAC changes.  Her home health nurse visited yesterday and took her VAC off.  She did not put the VAC back on and is requesting if we can transition to wet-to-dry dressings.  The patient denies any fever or chills at home.  She denies any puslike discharge coming from her wound.  She denies any pain in her feet.  She also shares that she has a wound on her left lateral calf that is being managed by a wound clinic.  She is asking for more pain medication since she only has 3 pills left and usually has to take a pill every time the home health nurse changes her dressing.  Allergies  Allergen Reactions   Morphine Nausea And Vomiting    Current Outpatient Medications  Medication Sig Dispense Refill   acetaminophen (TYLENOL) 500 MG tablet Take 1,000 mg by mouth every 6 (six) hours as needed for moderate pain.     amLODipine (NORVASC) 10 MG tablet Take 10 mg by mouth daily.     aspirin 81 MG tablet Take 81 mg by mouth daily.     atorvastatin (LIPITOR) 40 MG tablet Take 40 mg by mouth daily.     chlorthalidone (HYGROTON) 25 MG tablet Take 25 mg by mouth daily.      cholecalciferol (VITAMIN D3) 25 MCG (1000 UNIT) tablet Take 1,000 Units by mouth daily.     ciprofloxacin (CIPRO) 500 MG tablet Take 1 tablet (500 mg total) by mouth 2 (two) times daily. 22 tablet 0   Cyanocobalamin (B-12-SL) 1000 MCG SUBL Place 1,000 mcg under the tongue daily.     dapagliflozin propanediol (FARXIGA) 10 MG TABS tablet Take 10 mg by mouth daily.     famotidine (PEPCID) 40 MG tablet Take 40 mg by mouth See admin instructions. Take 40 mg daily, may take a second 40 mg dose as needed for heartburn     glipiZIDE (GLUCOTROL) 5 MG tablet Take 5 mg by mouth 2 (two) times daily.     Magnesium 250 MG TABS Take 250 mg by mouth 2 (two) times daily.     oxyCODONE-acetaminophen (PERCOCET/ROXICET) 5-325 MG tablet Take 1 tablet by mouth every 6 (six) hours as needed for severe pain.     oxyCODONE-acetaminophen (PERCOCET/ROXICET) 5-325 MG tablet Take 1 tablet by mouth every 6 (six) hours as needed for moderate pain. 30 tablet 0   pioglitazone (ACTOS) 15 MG tablet Take 15 mg by mouth daily.     potassium chloride SA (KLOR-CON M) 20 MEQ tablet Take 20 mEq by mouth daily.     Semaglutide,0.25 or 0.'5MG'$ /DOS, (OZEMPIC, 0.25 OR 0.5 MG/DOSE,) 2  MG/3ML SOPN Take 0.5 mg by mouth every Thursday.     No current facility-administered medications for this visit.     ROS:  See HPI  Physical Exam:  Incision: Left groin wound healing okay, with granulation tissue no puslike discharge.  Extremities: Left foot warm and well-perfused.  Left lateral calf wound          Assessment/Plan:  This is a 66 y.o. female who is s/p: Left groin washout after incisional dehiscence, status post left femoral to above-knee popliteal artery bypass on 12/06/2021   -Left groin wound healing okay with granulation tissue.  No puslike discharge.  No fever, chills, swelling, or erythema.  Tissue is approximating well -Patient is in too much pain to tolerate home VAC changes 3 times a week.  Home health nurse has requested  that we transition to wet-to-dry dressings.  She states that she will clean the left groin with wound cleanser, pat dry, then apply calcium alginate with silver to the wound bed and cover with a dry dressing 3 times a week.  We can pursue this plan for the time being, and we have encouraged the patient to start to clean her groin area with soap daily to reduce risk of infection.  She has not been cleaning it since surgery. -I have redressed the wound with a wet-to-dry gauze dressing and skin tape. -I will prescribe 15 more Percocet for the patient for pain control.  She currently only has 3 left and states that she has to take at least 1 pill daily, especially when the home health nurse does her dressing changes. -The patient will follow-up with [place] in 2 weeks for a wound check.  She is aware that she needs to follow-up sooner if she develops puslike discharge, foul-smelling discharge, erythema of the area.   [name] Serene, PA-C [facility] [phone]   Clinic MD:  Donzetta Matters

## 2022-01-10 ENCOUNTER — Telehealth: Payer: Self-pay

## 2022-01-10 NOTE — Telephone Encounter (Signed)
Spoke with patient and advised that I have not been able to get in contact with her [place]. I advised that Dr. Donzetta Matters is ok with the [facility] RN using Calcium Alginate with Silver on the wound. Patient is requesting pain medications. She stated that the wound itself hurts when she gets her dressing changed. I advised I will talk to Dr. Donzetta Matters about the medication refill once he is in the office tomorrow. Pt voiced her understanding.

## 2022-01-13 ENCOUNTER — Telehealth: Payer: Self-pay

## 2022-01-13 ENCOUNTER — Other Ambulatory Visit: Payer: Self-pay | Admitting: Physician Assistant

## 2022-01-13 MED ORDER — OXYCODONE-ACETAMINOPHEN 5-325 MG PO TABS: 1.0000 | ORAL_TABLET | Freq: Four times a day (QID) | ORAL | 0 refills | Status: AC | PRN

## 2022-01-13 NOTE — Telephone Encounter (Signed)
Pt called requesting a refill on her pain medication.  Reviewed pt's chart, returned call for clarification, two identifiers used. Pt stated that she was still having significant pain with dressing changes, but she is using less of it and supplementing with Tylenol.  Spoke with [place], [place] who sent in a refill.  Called pt and informed her of refill sent. Instructed her to only take it when absolutely necessary because he had only sent in a small amount to get her through until she comes back in to the office. Confirmed understanding.

## 2022-01-17 ENCOUNTER — Telehealth: Payer: Self-pay

## 2022-01-17 NOTE — Progress Notes (Unsigned)
POST OPERATIVE OFFICE NOTE    CC:  F/u for surgery  HPI:  This is a 66 y.o. female who is s/p left iliofemoral endarterectomy with vein patch angioplasty and common femoral to above-the-knee popliteal bypass with PTFE by Dr. Donzetta Matters on 12/06/2021 due to ulceration of the left calf.  She then required washout of the left groin with wound VAC placement due to breakdown of the incision on 12/13/2021.  She has since had the wound VAC discontinued.  She continues to have home health evaluate and care for the left calf and left groin.  She denies rest pain or further tissue loss of the left leg.  She is taking aspirin and statin daily.  Allergies  Allergen Reactions   Morphine Nausea And Vomiting    Current Outpatient Medications  Medication Sig Dispense Refill   acetaminophen (TYLENOL) 500 MG tablet Take 1,000 mg by mouth every 6 (six) hours as needed for moderate pain.     amLODipine (NORVASC) 10 MG tablet Take 10 mg by mouth daily.     aspirin 81 MG tablet Take 81 mg by mouth daily.     atorvastatin (LIPITOR) 40 MG tablet Take 40 mg by mouth daily.     chlorthalidone (HYGROTON) 25 MG tablet Take 25 mg by mouth daily.     cholecalciferol (VITAMIN D3) 25 MCG (1000 UNIT) tablet Take 1,000 Units by mouth daily.     ciprofloxacin (CIPRO) 500 MG tablet Take 1 tablet (500 mg total) by mouth 2 (two) times daily. 22 tablet 0   Cyanocobalamin (B-12-SL) 1000 MCG SUBL Place 1,000 mcg under the tongue daily.     dapagliflozin propanediol (FARXIGA) 10 MG TABS tablet Take 10 mg by mouth daily.     famotidine (PEPCID) 40 MG tablet Take 40 mg by mouth See admin instructions. Take 40 mg daily, may take a second 40 mg dose as needed for heartburn     glipiZIDE (GLUCOTROL) 5 MG tablet Take 5 mg by mouth 2 (two) times daily.     Magnesium 250 MG TABS Take 250 mg by mouth 2 (two) times daily.     oxyCODONE-acetaminophen (PERCOCET/ROXICET) 5-325 MG tablet Take 1 tablet by mouth every 6 (six) hours as needed for severe  pain. 10 tablet 0   pioglitazone (ACTOS) 15 MG tablet Take 15 mg by mouth daily.     potassium chloride SA (KLOR-CON M) 20 MEQ tablet Take 20 mEq by mouth daily.     Semaglutide,0.25 or 0.'5MG'$ /DOS, (OZEMPIC, 0.25 OR 0.5 MG/DOSE,) 2 MG/3ML SOPN Take 0.5 mg by mouth every Thursday.     No current facility-administered medications for this visit.     ROS:  See HPI  Physical Exam:  Vitals:   01/18/22 1446  BP: 134/72  Pulse: 71  Temp: 98 F (36.7 C)  TempSrc: Temporal  SpO2: 100%  Weight: 197 lb (89.4 kg)  Height: '5\' 4"'$  (1.626 m)    Incision:  see below Extremities:  brisk L DP and PT signal by doppler       Assessment/Plan:  This is a 66 y.o. female who is s/p: Left iliofemoral endarterectomy with femoral to above-the-knee popliteal bypass with PTFE with subsequent groin washout and VAC placement  -Over the past 2 weeks his left groin has shown significant improvement.  It is nearly healed.  She will continue wet-to-dry dressing changes on a daily basis.  The left calf wound has continued to show only slow improvements.  Dr. Donzetta Matters also involved in the  evaluation during today's visit.  We will refer her to [name] Houston to get his opinion on wound care for left calf.  Her left foot is well-perfused with brisk DP and PT Doppler signals.  She will return in 1 month to recheck left groin and calf wounds as well as for bypass duplex and ABI.  She will continue her aspirin and statin daily.   [name] Ligas, PA-C [facility] [phone]  Clinic MD:  Donzetta Matters

## 2022-01-18 ENCOUNTER — Encounter: Payer: Self-pay | Admitting: Vascular Surgery

## 2022-01-18 ENCOUNTER — Ambulatory Visit (INDEPENDENT_AMBULATORY_CARE_PROVIDER_SITE_OTHER): Payer: HMO | Admitting: Physician Assistant

## 2022-01-18 VITALS — BP 134/72 | HR 71 | Temp 98.0°F | Ht 64.0 in | Wt 197.0 lb

## 2022-01-18 DIAGNOSIS — I739 Peripheral vascular disease, unspecified: Secondary | ICD-10-CM

## 2022-01-18 DIAGNOSIS — I70262 Atherosclerosis of native arteries of extremities with gangrene, left leg: Secondary | ICD-10-CM

## 2022-01-19 ENCOUNTER — Other Ambulatory Visit: Payer: Self-pay

## 2022-01-19 DIAGNOSIS — I70262 Atherosclerosis of native arteries of extremities with gangrene, left leg: Secondary | ICD-10-CM

## 2022-01-19 DIAGNOSIS — I739 Peripheral vascular disease, unspecified: Secondary | ICD-10-CM

## 2022-01-23 ENCOUNTER — Other Ambulatory Visit: Payer: Self-pay | Admitting: *Deleted

## 2022-01-23 DIAGNOSIS — I70262 Atherosclerosis of native arteries of extremities with gangrene, left leg: Secondary | ICD-10-CM

## 2022-01-23 DIAGNOSIS — I739 Peripheral vascular disease, unspecified: Secondary | ICD-10-CM

## 2022-01-31 ENCOUNTER — Encounter (HOSPITAL_BASED_OUTPATIENT_CLINIC_OR_DEPARTMENT_OTHER): Payer: HMO | Attending: Internal Medicine | Admitting: Internal Medicine

## 2022-01-31 DIAGNOSIS — E1122 Type 2 diabetes mellitus with diabetic chronic kidney disease: Secondary | ICD-10-CM | POA: Insufficient documentation

## 2022-01-31 DIAGNOSIS — N183 Chronic kidney disease, stage 3 unspecified: Secondary | ICD-10-CM | POA: Insufficient documentation

## 2022-01-31 DIAGNOSIS — L97828 Non-pressure chronic ulcer of other part of left lower leg with other specified severity: Secondary | ICD-10-CM | POA: Insufficient documentation

## 2022-01-31 DIAGNOSIS — E11622 Type 2 diabetes mellitus with other skin ulcer: Secondary | ICD-10-CM | POA: Insufficient documentation

## 2022-01-31 DIAGNOSIS — I70248 Atherosclerosis of native arteries of left leg with ulceration of other part of lower left leg: Secondary | ICD-10-CM | POA: Diagnosis not present

## 2022-01-31 NOTE — Progress Notes (Signed)
[name] Houston, [name] Houston ([id number]) Visit Report for 01/31/2022 Abuse Risk Screen Details Patient Name: Date of Service: [name] Houston, [name] Houston 01/31/2022 9:15 A M Medical Record Number: [id number] Patient Account Number: [id number] Date of Birth/Sex: Treating RN: [birth year] (66 y.o. [name] Houston, [name] Houston Primary Care [name] Houston: [name] Houston Other Clinician: Referring [name] Houston: Treating [name] Houston/Extender: [name] Houston Weeks in Treatment: 0 Abuse Risk Screen Items Answer ABUSE RISK SCREEN: Has anyone close to you tried to hurt or harm you recentlyo No Do you feel uncomfortable with anyone in your familyo No Has anyone forced you do things that you didnt want to doo No Electronic Signature(s) Signed: 01/31/2022 4:30:19 PM By: Rhae Hammock RN Entered By: Rhae Hammock on 01/31/2022 09:35:26 -------------------------------------------------------------------------------- Activities of Daily Living Details Patient Name: Date of Service: [name] Houston 01/31/2022 9:15 A M Medical Record Number: [id number] Patient Account Number: [id number] Date of Birth/Sex: Treating RN: December 17, 1955 (66 y.o. [name] Houston, [name] Houston Primary Care Bricia Taher: [name] Houston Other Clinician: Referring Kem Hensen: Treating Shalonda Sachse/Extender: [name] Houston Weeks in Treatment: 0 Activities of Daily Living Items Answer Activities of Daily Living (Please select one for each item) Drive Automobile Completely Able T Medications ake Completely Able Use T elephone Completely Able Care for Appearance Completely Able Use T oilet Completely Able Bath / Shower Completely Able Dress Self Completely Able Feed Self Completely Able Walk Completely Able Get In / Out Bed Completely Able Housework Completely Able Prepare Meals Completely [place] for Self Completely Able Electronic Signature(s) Signed: 01/31/2022 4:30:19 PM By: Rhae Hammock  RN Entered By: Rhae Hammock on 01/31/2022 09:36:15 -------------------------------------------------------------------------------- Education Screening Details Patient Name: Date of Service: [name] Cavalier RO N J. 01/31/2022 9:15 A M Medical Record Number: [id number] Patient Account Number: [id number] Date of Birth/Sex: Treating RN: May 23, 1955 (66 y.o. [name] Houston, [name] Houston Primary Care [name] Houston: [name] Houston Other Clinician: Referring [name] Houston: Treating Tashi Band/Extender: [name] Houston in Treatment: 0 Primary Learner Assessed: Patient Learning Preferences/Education Level/Primary Language Learning Preference: Explanation, Demonstration, Communication Board, Printed Material Highest Education Level: College or Above Preferred Language: English Cognitive Barrier Language Barrier: No Translator Needed: No Memory Deficit: No Emotional Barrier: No Cultural/Religious Beliefs Affecting Medical Care: No Physical Barrier Impaired Vision: No Impaired Hearing: No Decreased Hand dexterity: No Knowledge/Comprehension Knowledge Level: High Comprehension Level: High Ability to understand written instructions: High Ability to understand verbal instructions: High Motivation Anxiety Level: Calm Cooperation: Cooperative Education Importance: Denies Need Interest in Health Problems: Asks Questions Perception: Coherent Willingness to Engage in Self-Management High Activities: Readiness to Engage in Self-Management High Activities: Electronic Signature(s) Signed: 01/31/2022 4:30:19 PM By: Rhae Hammock RN Entered By: Rhae Hammock on 01/31/2022 09:36:37 -------------------------------------------------------------------------------- Fall Risk Assessment Details Patient Name: Date of Service: [name] Cavalier RO N J. 01/31/2022 9:15 A M Medical Record Number: [id number] Patient Account Number: [id number] Date of Birth/Sex: Treating RN: 1955/06/21 (66 y.o.  [name] Houston, [name] Houston Primary Care Lorianne Malbrough: [name] Houston Other Clinician: Referring [name] Houston: Treating [name] Houston/Extender: [name] Houston Weeks in Treatment: 0 Fall Risk Assessment Items Have you had 2 or more falls in the last 12 monthso 0 No Have you had any fall that resulted in injury in the last 12 monthso 0 No FALLS RISK SCREEN History of falling - immediate or within 3 months 0 No Secondary diagnosis (Do you have 2 or more medical diagnoseso) 0 No Ambulatory aid None/bed rest/wheelchair/nurse 0 No Crutches/cane/walker 0 No Furniture 0 No Intravenous therapy Access/Saline/Heparin Lock 0 No Gait/Transferring Normal/ bed  rest/ wheelchair 0 No Weak (short steps with or without shuffle, stooped but able to lift head while walking, may seek 0 No support from furniture) Impaired (short steps with shuffle, may have difficulty arising from chair, head down, impaired 0 No balance) Mental Status Oriented to own ability 0 No Electronic Signature(s) Signed: 01/31/2022 4:30:19 PM By: Rhae Hammock RN Entered By: Rhae Hammock on 01/31/2022 09:36:43 -------------------------------------------------------------------------------- Foot Assessment Details Patient Name: Date of Service: [name] Cavalier RO N J. 01/31/2022 9:15 A M Medical Record Number: [id number] Patient Account Number: [id number] Date of Birth/Sex: Treating RN: 17-Oct-1955 (66 y.o. [name] Houston, [name] Houston Primary Care [name] Houston: [name] Houston Other Clinician: Referring [name] Houston: Treating [name] Houston Guedes/Extender: [name] Houston Weeks in Treatment: 0 Foot Assessment Items Site Locations + = Sensation present, - = Sensation absent, C = Callus, U = Ulcer R = Redness, W = Warmth, M = Maceration, PU = Pre-ulcerative lesion F = Fissure, S = Swelling, D = Dryness Assessment Right: Left: Other Deformity: No No Prior Foot Ulcer: No No Prior Amputation: No No Charcot Joint: No No Ambulatory  Status: Ambulatory With Help Assistance Device: Walker Gait: Steady Electronic Signature(s) Signed: 01/31/2022 4:30:19 PM By: Rhae Hammock RN Entered By: Rhae Hammock on 01/31/2022 09:37:16 -------------------------------------------------------------------------------- Nutrition Risk Screening Details Patient Name: Date of Service: Levy Sjogren J. 01/31/2022 9:15 A M Medical Record Number: [id number] Patient Account Number: [id number] Date of Birth/Sex: Treating RN: 1955/07/30 (65 y.o. [name] Houston, [name] Houston Primary Care Faelyn Sigler: [name] Houston Other Clinician: Referring Lalisa Kiehn: Treating Princesa Willig/Extender: [name] Houston Weeks in Treatment: 0 Height (in): 64 Weight (lbs): 204 Body Mass Index (BMI): 35 Nutrition Risk Screening Items Score Screening NUTRITION RISK SCREEN: I have an illness or condition that made me change the kind and/or amount of food I eat 0 No I eat fewer than two meals per day 0 No I eat few fruits and vegetables, or milk products 0 No I have three or more drinks of beer, liquor or wine almost every day 0 No I have tooth or mouth problems that make it hard for me to eat 0 No I don't always have enough money to buy the food I need 0 No I eat alone most of the time 0 No I take three or more different prescribed or over-the-counter drugs a day 0 No Without wanting to, I have lost or gained 10 pounds in the last six months 0 No I am not always physically able to shop, cook and/or feed myself 0 No Nutrition Protocols Good Risk Protocol 0 No interventions needed Moderate Risk Protocol High Risk Proctocol Risk Level: Good Risk Score: 0 Electronic Signature(s) Signed: 01/31/2022 4:30:19 PM By: Rhae Hammock RN Entered By: Rhae Hammock on 01/31/2022 09:36:49

## 2022-01-31 NOTE — Progress Notes (Signed)
[name] Houston, [name] Houston ([id number]) Visit Report for 01/31/2022 Allergy List Details Patient Name: Date of Service: [name] Houston, [name] Houston 01/31/2022 9:15 A M Medical Record Number: [id number] Patient Account Number: [id number] Date of Birth/Sex: Treating RN: 03-Apr-1956 (66 y.o. [name], [name] Primary Care Orel Cooler: Harlan Stains Other Clinician: Referring Jalessa Peyser: Treating Davelyn Gwinn/Extender: [name] Weeks in Treatment: 0 Allergies Active Allergies No Known Allergies Allergy Notes Electronic Signature(s) Signed: 01/31/2022 4:30:19 PM By: Rhae Hammock RN Entered By: Rhae Hammock on 01/31/2022 09:33:10 -------------------------------------------------------------------------------- Arrival Information Details Patient Name: Date of Service: [name] Cavalier RO N J. 01/31/2022 9:15 A M Medical Record Number: [id number] Patient Account Number: [id number] Date of Birth/Sex: Treating RN: 1956-04-30 (66 y.o. [name], [name] Primary Care Samyuktha Brau: Harlan Stains Other Clinician: Referring Zelena Bushong: Treating Parvin Stetzer/Extender: Jake Church in Treatment: 0 Visit Information Patient Arrived: [name] Arrival Time: 09:32 Accompanied By: husband Transfer Assistance: Manual Patient Identification Verified: Yes Secondary Verification Process Completed: Yes Patient Requires Transmission-Based Precautions: No Patient Has Alerts: No History Since Last Visit Added or deleted any medications: No Any new allergies or adverse reactions: No Had a fall or experienced change in activities of daily living that may affect risk of falls: No Signs or symptoms of abuse/neglect since last visito No Hospitalized since last visit: No Implantable device outside of the clinic excluding cellular tissue based products placed in the center since last visit: No Has Dressing in Place as Prescribed: Yes Electronic Signature(s) Signed: 01/31/2022 4:30:19 PM  By: Rhae Hammock RN Entered By: Rhae Hammock on 01/31/2022 09:32:56 -------------------------------------------------------------------------------- Clinic Level of Care Assessment Details Patient Name: Date of Service: [name] Houston, [name] Houston 01/31/2022 9:15 A M Medical Record Number: [id number] Patient Account Number: [id number] Date of Birth/Sex: Treating RN: [birth year] (66 y.o. [name], [name] Primary Care Billye Pickerel: Harlan Stains Other Clinician: Referring Gerry Blanchfield: Treating Elleni Mozingo/Extender: Jake Church in Treatment: 0 Clinic Level of Care Assessment Items TOOL 3 Quantity Score X- 1 0 Use when EandM and Procedure is performed on FOLLOW-UP visit ASSESSMENTS - Nursing Assessment / Reassessment X- 1 10 Reassessment of Co-morbidities (includes updates in patient status) X- 1 5 Reassessment of Adherence to Treatment Plan ASSESSMENTS - Wound and Skin Assessment / Reassessment '[]'$  - Points for Wound Assessment can only be taken for a new wound of unknown or different etiology and a procedure is 0 NOT performed to that wound X- 1 5 Simple Wound Assessment / Reassessment - one wound '[]'$  - 0 Complex Wound Assessment / Reassessment - multiple wounds '[]'$  - 0 Dermatologic / Skin Assessment (not related to wound area) ASSESSMENTS - Focused Assessment X- 1 5 Circumferential Edema Measurements - multi extremities '[]'$  - 0 Nutritional Assessment / Counseling / Intervention '[]'$  - 0 Lower Extremity Assessment (monofilament, tuning fork, pulses) '[]'$  - 0 Peripheral Arterial Disease Assessment (using hand held doppler) ASSESSMENTS - Ostomy and/or Continence Assessment and Care '[]'$  - 0 Incontinence Assessment and Management '[]'$  - 0 Ostomy Care Assessment and Management (repouching, etc.) PROCESS - Coordination of Care '[]'$  - Points for Discharge Coordination can only be taken for a new wound of unknown or different etiology and a procedure 0 is NOT performed  to that wound X- 1 15 Simple Patient / Family Education for ongoing care '[]'$  - 0 Complex (extensive) Patient / Family Education for ongoing care X- 1 10 Staff obtains Programmer, systems, Records, T Results / Process Orders est '[]'$  - 0 Staff telephones HHA, Nursing Homes / Clarify orders / etc '[]'$  -  0 Routine Transfer to another Facility (non-emergent condition) '[]'$  - 0 Routine Hospital Admission (non-emergent condition) X- 1 15 New Admissions / Biomedical engineer / Ordering NPWT Apligraf, etc. , '[]'$  - 0 Emergency Hospital Admission (emergent condition) X- 1 10 Simple Discharge Coordination '[]'$  - 0 Complex (extensive) Discharge Coordination PROCESS - Special Needs '[]'$  - 0 Pediatric / Minor Patient Management '[]'$  - 0 Isolation Patient Management '[]'$  - 0 Hearing / Language / Visual special needs '[]'$  - 0 Assessment of Community assistance (transportation, D/C planning, etc.) '[]'$  - 0 Additional assistance / Altered mentation '[]'$  - 0 Support Surface(s) Assessment (bed, cushion, seat, etc.) INTERVENTIONS - Wound Cleansing / Measurement '[]'$  - Points for Wound Cleaning / Measurement, Wound Dressing, Specimen Collection and Specimen taken to lab can only 0 be taken for a new wound of unknown or different etiology and a procedure is NOT performed to that wound X- 1 5 Simple Wound Cleansing - one wound '[]'$  - 0 Complex Wound Cleansing - multiple wounds X- 1 5 Wound Imaging (photographs - any number of wounds) '[]'$  - 0 Wound Tracing (instead of photographs) X- 1 5 Simple Wound Measurement - one wound '[]'$  - 0 Complex Wound Measurement - multiple wounds INTERVENTIONS - Wound Dressings '[]'$  - 0 Small Wound Dressing one or multiple wounds X- 1 15 Medium Wound Dressing one or multiple wounds '[]'$  - 0 Large Wound Dressing one or multiple wounds INTERVENTIONS - Miscellaneous '[]'$  - 0 External ear exam '[]'$  - 0 Specimen Collection (cultures, biopsies, blood, body fluids, etc.) '[]'$  - 0 Specimen(s) /  Culture(s) sent or taken to Lab for analysis '[]'$  - 0 Patient Transfer (multiple staff / Civil Service fast streamer / Similar devices) '[]'$  - 0 Simple Staple / Suture removal (25 or less) '[]'$  - 0 Complex Staple / Suture removal (26 or more) '[]'$  - 0 Hypo / Hyperglycemic Management (close monitor of Blood Glucose) '[]'$  - 0 Ankle / Brachial Index (ABI) - do not check if billed separately X- 1 5 Vital Signs Has the patient been seen at the hospital within the last three years: Yes Total Score: 110 Level Of Care: New/Established - Level 3 Electronic Signature(s) Signed: 01/31/2022 4:30:19 PM By: Rhae Hammock RN Entered By: Rhae Hammock on 01/31/2022 10:34:48 -------------------------------------------------------------------------------- Encounter Discharge Information Details Patient Name: Date of Service: [name] Cavalier RO N J. 01/31/2022 9:15 A M Medical Record Number: [id number] Patient Account Number: [id number] Date of Birth/Sex: Treating RN: Jul 23, 1955 (66 y.o. [name], [name] Primary Care Farouk Vivero: Harlan Stains Other Clinician: Referring Breindy Meadow: Treating Nicolasa Milbrath/Extender: [name] Weeks in Treatment: 0 Encounter Discharge Information Items Post Procedure Vitals Discharge Condition: Stable Temperature (F): 98.7 Ambulatory Status: Walker Pulse (bpm): 74 Discharge Destination: Home Respiratory Rate (breaths/min): 17 Transportation: Private Auto Blood Pressure (mmHg): 148/84 Accompanied By: husband Schedule Follow-up Appointment: Yes Clinical Summary of Care: Patient Declined Electronic Signature(s) Signed: 01/31/2022 4:30:19 PM By: Rhae Hammock RN Entered By: Rhae Hammock on 01/31/2022 10:38:06 -------------------------------------------------------------------------------- Lower Extremity Assessment Details Patient Name: Date of Service: [name] Cavalier RO N J. 01/31/2022 9:15 A M Medical Record Number: [id number] Patient Account Number:  [id number] Date of Birth/Sex: Treating RN: 05/29/1955 (66 y.o. [name], [name] Primary Care Saffron Busey: Harlan Stains Other Clinician: Referring Sofia Jaquith: Treating Raelin Pixler/Extender: [name] Weeks in Treatment: 0 Edema Assessment Assessed: [Left: Yes] [Right: No] Edema: [Left: Ye] [Right: s] Calf Left: Right: Point of Measurement: 29 cm From Medial Instep 41 cm Ankle Left: Right: Point of Measurement: 7 cm From Medial Instep 23  cm Knee To Floor Left: Right: From Medial Instep 41 cm Vascular Assessment Pulses: Dorsalis Pedis Palpable: [Left:Yes] Posterior Tibial Palpable: [Left:Yes] Electronic Signature(s) Signed: 01/31/2022 4:30:19 PM By: Rhae Hammock RN Entered By: Rhae Hammock on 01/31/2022 09:44:05 -------------------------------------------------------------------------------- Multi Wound Chart Details Patient Name: Date of Service: [name] Cavalier RO N J. 01/31/2022 9:15 A M Medical Record Number: [id number] Patient Account Number: [id number] Date of Birth/Sex: Treating RN: January 03, 1956 (66 y.o. [name], [name] Primary Care Aramis Weil: Harlan Stains Other Clinician: Referring Zacchaeus Halm: Treating Claramae Rigdon/Extender: [name] Weeks in Treatment: 0 Vital Signs Height(in): 64 Capillary Blood Glucose(mg/dl): 103 Weight(lbs): 197 Pulse(bpm): 57 Body Mass Index(BMI): 33.8 Blood Pressure(mmHg): 158/83 Temperature(F): 98.2 Respiratory Rate(breaths/min): 17 Photos: [N/A:N/A] Left, Posterior Lower Leg N/A N/A Wound Location: Gradually Appeared N/A N/A Wounding Event: Arterial Insufficiency Ulcer N/A N/A Primary Etiology: Hypertension, Peripheral Arterial N/A N/A Comorbid History: Disease, Peripheral Venous Disease, Type II Diabetes, Osteoarthritis, Osteomyelitis 11/21/2021 N/A N/A Date Acquired: 0 N/A N/A Weeks of Treatment: Open N/A N/A Wound Status: No N/A N/A Wound Recurrence: 7.5x6x0.2 N/A  N/A Measurements L x W x D (cm) 35.343 N/A N/A A (cm) : rea 7.069 N/A N/A Volume (cm) : Unclassifiable N/A N/A Classification: Large N/A N/A Exudate A mount: Serosanguineous N/A N/A Exudate Type: red, brown N/A N/A Exudate Color: Distinct, outline attached N/A N/A Wound Margin: None Present (0%) N/A N/A Granulation A mount: Large (67-100%) N/A N/A Necrotic A mount: Eschar, Adherent Slough N/A N/A Necrotic Tissue: Fascia: No N/A N/A Exposed Structures: Fat Layer (Subcutaneous Tissue): No Tendon: No Muscle: No Joint: No Bone: No None N/A N/A Epithelialization: Debridement - Excisional N/A N/A Debridement: Pre-procedure Verification/Time Out 10:25 N/A N/A Taken: Lidocaine N/A N/A Pain Control: Necrotic/Eschar, Subcutaneous, N/A N/A Tissue Debrided: Slough Skin/Subcutaneous Tissue N/A N/A Level: 45 N/A N/A Debridement A (sq cm): rea Blade, Forceps, Scissors N/A N/A Instrument: Minimum N/A N/A Bleeding: Pressure N/A N/A Hemostasis Achieved: 0 N/A N/A Procedural Pain: 0 N/A N/A Post Procedural Pain: Debridement Treatment Response: Procedure was tolerated well N/A N/A Post Debridement Measurements L x 7.5x6x0.2 N/A N/A W x D (cm) 7.069 N/A N/A Post Debridement Volume: (cm) Excoriation: No N/A N/A Periwound Skin Texture: Induration: No Callus: No Crepitus: No Rash: No Scarring: No Maceration: No N/A N/A Periwound Skin Moisture: Dry/Scaly: No Atrophie Blanche: No N/A N/A Periwound Skin Color: Cyanosis: No Ecchymosis: No Erythema: No Hemosiderin Staining: No Mottled: No Pallor: No Rubor: No No Abnormality N/A N/A Temperature: Yes N/A N/A Tenderness on Palpation: Debridement N/A N/A Procedures Performed: Treatment Notes Wound #2 (Lower Leg) Wound Laterality: Left, Posterior Cleanser Soap and Water Discharge Instruction: May shower and wash wound with dial antibacterial soap and water prior to dressing change. Wound Cleanser Discharge  Instruction: Cleanse the wound with wound cleanser prior to applying a clean dressing using gauze sponges, not tissue or cotton balls. Peri-Wound Care Topical Primary Dressing Dakin's Solution 0.25%, 16 (oz) Discharge Instruction: Moisten gauze with Dakin's solution Secondary Dressing ABD Pad, 5x9 Discharge Instruction: Apply over primary dressing as directed. Woven Gauze Sponge, Non-Sterile 4x4 in Discharge Instruction: Apply over primary dressing as directed. Secured With [employer], 4.5x3.1 (in/yd) Discharge Instruction: Secure with Kerlix as directed. 64M Medipore H Soft Cloth Surgical T ape, 4 x 10 (in/yd) Discharge Instruction: Secure with tape as directed. Stretch Net Size 5, 10 (yds) Compression Wrap Compression Stockings Add-Ons Electronic Signature(s) Signed: 01/31/2022 4:30:19 PM By: Rhae Hammock RN Signed: 01/31/2022 4:41:45 PM By: Kalman Shan DO Entered By: Kalman Shan  on 01/31/2022 11:20:07 -------------------------------------------------------------------------------- [place] Details Patient Name: Date of Service: [name] Houston, [name] Houston 01/31/2022 9:15 A M Medical Record Number: [id number] Patient Account Number: [id number] Date of Birth/Sex: Treating RN: March 11, 1956 (66 y.o. [name], [name] Primary Care Lexii Walsh: Harlan Stains Other Clinician: Referring Alayshia Marini: Treating Mukhtar Shams/Extender: Jake Church in Treatment: 0 Active Inactive Orientation to the Wound Care Program Nursing Diagnoses: Knowledge deficit related to the wound healing center program Goals: Patient/caregiver will verbalize understanding of the [facility] Program Date Initiated: 01/31/2022 Target Resolution Date: 03/04/2022 Goal Status: Active Interventions: Provide education on orientation to the wound center Notes: Wound/Skin Impairment Nursing Diagnoses: Impaired tissue integrity Knowledge deficit  related to ulceration/compromised skin integrity Goals: Patient will have a decrease in wound volume by X% from date: (specify in notes) Date Initiated: 01/31/2022 Target Resolution Date: 03/04/2022 Goal Status: Active Patient/caregiver will verbalize understanding of skin care regimen Date Initiated: 01/31/2022 Target Resolution Date: 03/04/2022 Goal Status: Active Ulcer/skin breakdown will have a volume reduction of 30% by week 4 Date Initiated: 01/31/2022 Target Resolution Date: 03/03/2022 Goal Status: Active Interventions: Assess patient/caregiver ability to obtain necessary supplies Assess patient/caregiver ability to perform ulcer/skin care regimen upon admission and as needed Assess ulceration(s) every visit Notes: Electronic Signature(s) Signed: 01/31/2022 4:30:19 PM By: Rhae Hammock RN Entered By: Rhae Hammock on 01/31/2022 09:52:38 -------------------------------------------------------------------------------- Pain Assessment Details Patient Name: Date of Service: [name] Cavalier RO N J. 01/31/2022 9:15 A M Medical Record Number: [id number] Patient Account Number: [id number] Date of Birth/Sex: Treating RN: 07/20/1955 (66 y.o. [name], [name] Primary Care Mayukha Symmonds: Harlan Stains Other Clinician: Referring Trissa Molina: Treating Ruthia Person/Extender: [name] Weeks in Treatment: 0 Active Problems Location of Pain Severity and Description of Pain Patient Has Paino No Site Locations Pain Management and Medication Current Pain Management: Electronic Signature(s) Signed: 01/31/2022 4:30:19 PM By: Rhae Hammock RN Signed: 01/31/2022 4:30:19 PM By: Rhae Hammock RN Entered By: Rhae Hammock on 01/31/2022 09:37:26 -------------------------------------------------------------------------------- Patient/Caregiver Education Details Patient Name: Date of Service: [name] Houston 10/3/2023andnbsp9:15 [place] Record Number:  [id number] Patient Account Number: [id number] Date of Birth/Gender: Treating RN: 1956-02-01 (66 y.o. [name], [name] Primary Care Physician: Harlan Stains Other Clinician: Referring Physician: Treating Physician/Extender: Jake Church in Treatment: 0 Education Assessment Education Provided To: Patient Education Topics Provided Welcome T [facility] [place]: o Methods: Explain/Verbal Responses: Reinforcements needed, State content correctly Electronic Signature(s) Signed: 01/31/2022 4:30:19 PM By: Rhae Hammock RN Entered By: Rhae Hammock on 01/31/2022 09:52:48 -------------------------------------------------------------------------------- Wound Assessment Details Patient Name: Date of Service: [name] Sjogren J. 01/31/2022 9:15 A M Medical Record Number: [id number] Patient Account Number: [id number] Date of Birth/Sex: Treating RN: 10/19/1955 (66 y.o. [name], [name] Primary Care Kitt Minardi: Harlan Stains Other Clinician: Referring Zymere Patlan: Treating Dwayne Begay/Extender: [name] Weeks in Treatment: 0 Wound Status Wound Number: 2 Primary Arterial Insufficiency Ulcer Etiology: Wound Location: Left, Posterior Lower Leg Wound Open Wounding Event: Gradually Appeared Status: Date Acquired: 11/21/2021 Comorbid Hypertension, Peripheral Arterial Disease, Peripheral Venous Weeks Of Treatment: 0 History: Disease, Type II Diabetes, Osteoarthritis, Osteomyelitis Clustered Wound: No Photos Wound Measurements Length: (cm) 7.5 Width: (cm) 6 Depth: (cm) 0.2 Area: (cm) 35.343 Volume: (cm) 7.069 % Reduction in Area: % Reduction in Volume: Epithelialization: None Tunneling: No Undermining: No Wound Description Classification: Unclassifiable Wound Margin: Distinct, outline attached Exudate Amount: Large Exudate Type: Serosanguineous Exudate Color: red, brown Foul Odor After Cleansing: No Slough/Fibrino  Yes Wound Bed Granulation  Amount: None Present (0%) Exposed Structure Necrotic Amount: Large (67-100%) Fascia Exposed: No Necrotic Quality: Eschar, Adherent Slough Fat Layer (Subcutaneous Tissue) Exposed: No Tendon Exposed: No Muscle Exposed: No Joint Exposed: No Bone Exposed: No Periwound Skin Texture Texture Color No Abnormalities Noted: No No Abnormalities Noted: No Callus: No Atrophie Blanche: No Crepitus: No Cyanosis: No Excoriation: No Ecchymosis: No Induration: No Erythema: No Rash: No Hemosiderin Staining: No Scarring: No Mottled: No Pallor: No Moisture Rubor: No No Abnormalities Noted: No Dry / Scaly: No Temperature / Pain Maceration: No Temperature: No Abnormality Tenderness on Palpation: Yes Treatment Notes Wound #2 (Lower Leg) Wound Laterality: Left, Posterior Cleanser Soap and Water Discharge Instruction: May shower and wash wound with dial antibacterial soap and water prior to dressing change. Wound Cleanser Discharge Instruction: Cleanse the wound with wound cleanser prior to applying a clean dressing using gauze sponges, not tissue or cotton balls. Peri-Wound Care Topical Primary Dressing Dakin's Solution 0.25%, 16 (oz) Discharge Instruction: Moisten gauze with Dakin's solution Secondary Dressing ABD Pad, 5x9 Discharge Instruction: Apply over primary dressing as directed. Woven Gauze Sponge, Non-Sterile 4x4 in Discharge Instruction: Apply over primary dressing as directed. Secured With [employer], 4.5x3.1 (in/yd) Discharge Instruction: Secure with Kerlix as directed. 22M Medipore H Soft Cloth Surgical T ape, 4 x 10 (in/yd) Discharge Instruction: Secure with tape as directed. Stretch Net Size 5, 10 (yds) Compression Wrap Compression Stockings Add-Ons Electronic Signature(s) Signed: 01/31/2022 4:30:19 PM By: Rhae Hammock RN Entered By: Rhae Hammock on 01/31/2022  09:48:36 -------------------------------------------------------------------------------- Vitals Details Patient Name: Date of Service: [name] Cavalier RO N J. 01/31/2022 9:15 A M Medical Record Number: [id number] Patient Account Number: [id number] Date of Birth/Sex: Treating RN: 1955-10-24 (66 y.o. [name], [name] Primary Care Alaira Level: Harlan Stains Other Clinician: Referring Adea Geisel: Treating Lesle Faron/Extender: [name] Weeks in Treatment: 0 Vital Signs Time Taken: 09:37 Temperature (F): 98.2 Height (in): 64 Pulse (bpm): 65 Source: Stated Respiratory Rate (breaths/min): 17 Weight (lbs): 197 Blood Pressure (mmHg): 158/83 Source: Stated Capillary Blood Glucose (mg/dl): 103 Body Mass Index (BMI): 33.8 Reference Range: 80 - 120 mg / dl Electronic Signature(s) Signed: 01/31/2022 4:30:19 PM By: Rhae Hammock RN Entered By: Rhae Hammock on 01/31/2022 09:42:01

## 2022-01-31 NOTE — Progress Notes (Signed)
[name] Houston, [name] Houston ([id number]) Visit Report for 01/31/2022 Chief Complaint Document Details Patient Name: Date of Service: [name] Houston, [name] Houston 01/31/2022 9:15 A M Medical Record Number: [id number] Patient Account Number: [id number] Date of Birth/Sex: Treating RN: 01/03/1956 (66 y.o. [name] Houston, [name] Houston Primary Care Provider: Harlan Stains Other Clinician: Referring Provider: Treating Provider/Extender: [name] Weeks in Treatment: 0 Information Obtained from: Patient Chief Complaint 10/20/2021; Left posterior leg wound 01/31/2022; left posterior leg wound Electronic Signature(s) Signed: 01/31/2022 4:41:45 PM By: Kalman Shan DO Entered By: Kalman Shan on 01/31/2022 11:20:29 -------------------------------------------------------------------------------- Debridement Details Patient Name: Date of Service: [name] Cavalier RO N J. 01/31/2022 9:15 A M Medical Record Number: [id number] Patient Account Number: [id number] Date of Birth/Sex: Treating RN: 08/24/1955 (66 y.o. [name] Houston, [name] Houston Primary Care Provider: Harlan Stains Other Clinician: Referring Provider: Treating Provider/Extender: [name] Weeks in Treatment: 0 Debridement Performed for Assessment: Wound #2 Left,Posterior Lower Leg Performed By: Physician Kalman Shan, DO Debridement Type: Debridement Severity of Tissue Pre Debridement: Fat layer exposed Level of Consciousness (Pre-procedure): Awake and Alert Pre-procedure Verification/Time Out Yes - 10:25 Taken: Start Time: 10:25 Pain Control: Lidocaine T Area Debrided (L x W): otal 7.5 (cm) x 6 (cm) = 45 (cm) Tissue and other material debrided: Viable, Non-Viable, Eschar, Slough, Subcutaneous, Slough Level: Skin/Subcutaneous Tissue Debridement Description: Excisional Instrument: Blade, Forceps, Scissors Bleeding: Minimum Hemostasis Achieved: Pressure End Time: 10:25 Procedural Pain: 0 Post Procedural Pain:  0 Response to Treatment: Procedure was tolerated well Level of Consciousness (Post- Awake and Alert procedure): Post Debridement Measurements of Total Wound Length: (cm) 7.5 Width: (cm) 6 Depth: (cm) 0.2 Volume: (cm) 7.069 Character of Wound/Ulcer Post Debridement: Improved Severity of Tissue Post Debridement: Fat layer exposed Post Procedure Diagnosis Same as Pre-procedure Electronic Signature(s) Signed: 01/31/2022 4:30:19 PM By: Rhae Hammock RN Signed: 01/31/2022 4:41:45 PM By: Kalman Shan DO Entered By: Rhae Hammock on 01/31/2022 10:31:00 -------------------------------------------------------------------------------- HPI Details Patient Name: Date of Service: [name] Cavalier RO N J. 01/31/2022 9:15 A M Medical Record Number: [id number] Patient Account Number: [id number] Date of Birth/Sex: Treating RN: 09-Sep-1955 (66 y.o. [name] Houston Primary Care Provider: Harlan Stains Other Clinician: Referring Provider: Treating Provider/Extender: [name] Weeks in Treatment: 0 History of Present Illness HPI Description: Admission 06/26/2021 Ms. Elyanah Farino is a 65 year old female with a past medical history of uncontrolled type 2 diabetes on oral agents with last hemoglobin A1c of 9, peripheral arterial disease that presents to the clinic for a 3-week history of worsening wound to the posterior aspect of her left leg. She is not sure how it started. She has been using normal saline to the wound bed. She had arterial segmental pressures that showed a left ABI of 0.52 she is scheduled to see vein and vascular on 7/5. She is currently on doxycycline prescribed by her primary care doctor. She currently denies systemic signs of infection. 7/7; patient presents for follow-up. She saw Dr. Donzetta Matters yesterday, vein and vascular to discuss her arterial status. He is scheduling her for an arteriogram for possible intervention. She has had no issues with the  Medihoney. 7/24; patient presents for follow-up. She continues to use Medihoney. She is scheduled to have a stress test on 7/28 in order to be cleared for vascular surgery. Plan is for left common femoral endarterectomy and left common femoral to below the knee popliteal artery bypass. Readmission 01/31/2022 Patient had a left iliofemoral endarterectomy with vein patch angioplasty and common femoral to above-the-knee popliteal bypass with  PTFE by Dr. Donzetta Matters on 12/06/2021. She had follow-up with vein and vascular on 01/18/2022 and it was noted that her left foot is well-perfused with brisk DP and PT Doppler signals. She has follow-up again with vein and vascular at the end of this month for bypass duplex and ABIs. She is currently been using Medihoney to the wound bed. She reports improvement to her chronic pain. She currently denies signs of infection. Electronic Signature(s) Signed: 01/31/2022 4:41:45 PM By: Kalman Shan DO Entered By: Kalman Shan on 01/31/2022 11:21:57 -------------------------------------------------------------------------------- Physical Exam Details Patient Name: Date of Service: [name] Sjogren J. 01/31/2022 9:15 A M Medical Record Number: [id number] Patient Account Number: [id number] Date of Birth/Sex: Treating RN: 1955-10-13 (66 y.o. [name] Houston Primary Care Provider: Harlan Stains Other Clinician: Referring Provider: Treating Provider/Extender: [name] Weeks in Treatment: 0 Constitutional respirations regular, non-labored and within target range for patient.. Cardiovascular 2+ dorsalis pedis/posterior tibialis pulses. Psychiatric pleasant and cooperative. Notes Left lower extremity: T the posterior aspect there is an open wound with eschar throughout. o Electronic Signature(s) Signed: 01/31/2022 4:41:45 PM By: Kalman Shan DO Entered By: Kalman Shan on 01/31/2022  11:22:21 -------------------------------------------------------------------------------- Physician Orders Details Patient Name: Date of Service: [name] Cavalier RO N J. 01/31/2022 9:15 A M Medical Record Number: [id number] Patient Account Number: [id number] Date of Birth/Sex: Treating RN: Oct 12, 1955 (66 y.o. [name] Houston, [name] Houston Primary Care Provider: Harlan Stains Other Clinician: Referring Provider: Treating Provider/Extender: Jake Church in Treatment: 0 Verbal / Phone Orders: No Diagnosis Coding ICD-10 Coding Code Description [phone] Atherosclerosis of native arteries of left leg with ulceration of other part of lower leg L97.828 Non-pressure chronic ulcer of other part of left lower leg with other specified severity E11.622 Type 2 diabetes mellitus with other skin ulcer Follow-up Appointments ppointment in 1 week. - w/ Dr. Heber [place] and Elias Else # 9 Return A Other: - F/u w/ Vascular in a month Anesthetic (In clinic) Topical Lidocaine 5% applied to wound bed Bathing/ Shower/ Hygiene May shower and wash wound with soap and water. - can use antibacterial soap Edema Control - Lymphedema / SCD / Other Elevate legs to the level of the heart or above for 30 minutes daily and/or when sitting, a frequency of: - throughout the day Avoid standing for long periods of time. Additional Orders / Instructions Follow Nutritious Diet - Monitor/Control Blood Sugars Home Health New wound care orders this week; continue Home Health for wound care. May utilize formulary equivalent dressing for wound treatment orders unless otherwise specified. [facility] home [facility] to change 2-3 x a week Wound Treatment Wound #2 - Lower Leg Wound Laterality: Left, Posterior Cleanser: Soap and Water (Home Health) 2 x Per YCX/44 Days Discharge Instructions: May shower and wash wound with dial antibacterial soap and water prior to dressing change. Cleanser: Wound Cleanser (Home Health) 2 x  Per Day/15 Days Discharge Instructions: Cleanse the wound with wound cleanser prior to applying a clean dressing using gauze sponges, not tissue or cotton balls. Prim Dressing: Dakin's Solution 0.25%, 16 (oz) (Home Health) 2 x Per Day/15 Days ary Discharge Instructions: Moisten gauze with Dakin's solution Secondary Dressing: ABD Pad, 5x9 (Home Health) 2 x Per YJE/56 Days Discharge Instructions: Apply over primary dressing as directed. Secondary Dressing: Woven Gauze Sponge, Non-Sterile 4x4 in (Home Health) 2 x Per Day/15 Days Discharge Instructions: Apply over primary dressing as directed. Secured With: [employer], 4.5x3.1 (in/yd) (Home Health) 2 x Per DJS/97 Days Discharge Instructions:  Secure with Kerlix as directed. Secured With: 18M Medipore H Soft Cloth Surgical T ape, 4 x 10 (in/yd) (Home Health) 2 x Per Day/15 Days Discharge Instructions: Secure with tape as directed. Secured With: Charity fundraiser Size 5, 10 (yds) (Home Health) 2 x Per Day/15 Days Patient Medications llergies: No Known Allergies A Notifications Medication Indication Start End PRN pain/debridments 01/31/2022 lidocaine DOSE topical 5 % gel - gel topical 01/31/2022 Dakin's Solution DOSE 1 - miscellaneous 0.125 % solution - moisten gauze for wet to dry dressings Electronic Signature(s) Signed: 02/01/2022 9:14:43 AM By: Kalman Shan DO Signed: 02/02/2022 3:46:02 PM By: Rhae Hammock RN Previous Signature: 01/31/2022 11:25:42 AM Version By: Kalman Shan DO Entered By: Rhae Hammock on 02/01/2022 08:39:51 -------------------------------------------------------------------------------- Problem List Details Patient Name: Date of Service: [name] Cavalier RO N J. 01/31/2022 9:15 A M Medical Record Number: [id number] Patient Account Number: [id number] Date of Birth/Sex: Treating RN: 1955-05-04 (66 y.o. [name] Houston, [name] Houston Primary Care Provider: Harlan Stains Other Clinician: Referring  Provider: Treating Provider/Extender: [name] Weeks in Treatment: 0 Active Problems ICD-10 Encounter Code Description Active Date MDM Diagnosis I70.248 Atherosclerosis of native arteries of left leg with ulceration of other part of 01/31/2022 No Yes lower leg L97.828 Non-pressure chronic ulcer of other part of left lower leg with other specified 01/31/2022 No Yes severity E11.622 Type 2 diabetes mellitus with other skin ulcer 01/31/2022 No Yes Inactive Problems Resolved Problems Electronic Signature(s) Signed: 01/31/2022 4:41:45 PM By: Kalman Shan DO Entered By: Kalman Shan on 01/31/2022 11:20:00 -------------------------------------------------------------------------------- Progress Note Details Patient Name: Date of Service: [name] Cavalier RO N J. 01/31/2022 9:15 A M Medical Record Number: [id number] Patient Account Number: [id number] Date of Birth/Sex: Treating RN: Apr 09, 1956 (66 y.o. [name] Houston, [name] Houston Primary Care Provider: Harlan Stains Other Clinician: Referring Provider: Treating Provider/Extender: [name] Weeks in Treatment: 0 Subjective Chief Complaint Information obtained from Patient 10/20/2021; Left posterior leg wound 01/31/2022; left posterior leg wound History of Present Illness (HPI) Admission 06/26/2021 Ms. Nashya Garlington is a 66 year old female with a past medical history of uncontrolled type 2 diabetes on oral agents with last hemoglobin A1c of 9, peripheral arterial disease that presents to the clinic for a 3-week history of worsening wound to the posterior aspect of her left leg. She is not sure how it started. She has been using normal saline to the wound bed. She had arterial segmental pressures that showed a left ABI of 0.52 she is scheduled to see vein and vascular on 7/5. She is currently on doxycycline prescribed by her primary care doctor. She currently denies systemic signs of infection. 7/7;  patient presents for follow-up. She saw Dr. Donzetta Matters yesterday, vein and vascular to discuss her arterial status. He is scheduling her for an arteriogram for possible intervention. She has had no issues with the Medihoney. 7/24; patient presents for follow-up. She continues to use Medihoney. She is scheduled to have a stress test on 7/28 in order to be cleared for vascular surgery. Plan is for left common femoral endarterectomy and left common femoral to below the knee popliteal artery bypass. Readmission 01/31/2022 Patient had a left iliofemoral endarterectomy with vein patch angioplasty and common femoral to above-the-knee popliteal bypass with PTFE by Dr. Donzetta Matters on 12/06/2021. She had follow-up with vein and vascular on 01/18/2022 and it was noted that her left foot is well-perfused with brisk DP and PT Doppler signals. She has follow-up again with vein and vascular at the end of this month for bypass duplex  and ABIs. She is currently been using Medihoney to the wound bed. She reports improvement to her chronic pain. She currently denies signs of infection. Patient History Information obtained from Patient, Caregiver. Allergies No Known Allergies Family History Cancer - Father, Diabetes - Mother,Maternal Grandparents,Father, Hypertension - Mother, No family history of Heart Disease, Hereditary Spherocytosis, Kidney Disease, Lung Disease, Seizures, Stroke, Thyroid Problems, Tuberculosis. Social History Former smoker, Marital Status - Married, Alcohol Use - Rarely, Drug Use - No History, Caffeine Use - Daily. Medical History Eyes Denies history of Cataracts, Glaucoma, Optic Neuritis Cardiovascular Patient has history of Hypertension, Peripheral Arterial Disease, Peripheral Venous Disease Denies history of Angina, Arrhythmia, Coronary Artery Disease, Deep Vein Thrombosis, Hypotension, Myocardial Infarction, Phlebitis, Vasculitis Gastrointestinal Denies history of Cirrhosis , Colitis, Crohnoos,  Hepatitis A, Hepatitis B, Hepatitis C Endocrine Patient has history of Type II Diabetes - A1C-9.1 Denies history of Type I Diabetes Immunological Denies history of Lupus Erythematosus, Raynaudoos, Scleroderma Musculoskeletal Patient has history of Osteoarthritis, Osteomyelitis - Hx ankle Denies history of Gout, Rheumatoid Arthritis Neurologic Denies history of Neuropathy, Quadriplegia, Paraplegia, Seizure Disorder Hospitalization/Surgery History - left knee. Medical A Surgical History Notes nd Genitourinary CKD-3 Review of Systems (ROS) Eyes Denies complaints or symptoms of Dry Eyes, Vision Changes, Glasses / Contacts. Psychiatric Denies complaints or symptoms of Claustrophobia. Objective Constitutional respirations regular, non-labored and within target range for patient.. Vitals Time Taken: 9:37 AM, Height: 64 in, Source: Stated, Weight: 197 lbs, Source: Stated, BMI: 33.8, Temperature: 98.2 F, Pulse: 65 bpm, Respiratory Rate: 17 breaths/min, Blood Pressure: 158/83 mmHg, Capillary Blood Glucose: 103 mg/dl. Cardiovascular 2+ dorsalis pedis/posterior tibialis pulses. Psychiatric pleasant and cooperative. General Notes: Left lower extremity: T the posterior aspect there is an open wound with eschar throughout. o Integumentary (Hair, Skin) Wound #2 status is Open. Original cause of wound was Gradually Appeared. The date acquired was: 11/21/2021. The wound is located on the Left,Posterior Lower Leg. The wound measures 7.5cm length x 6cm width x 0.2cm depth; 35.343cm^2 area and 7.069cm^3 volume. There is no tunneling or undermining noted. There is a large amount of serosanguineous drainage noted. The wound margin is distinct with the outline attached to the wound base. There is no granulation within the wound bed. There is a large (67-100%) amount of necrotic tissue within the wound bed including Eschar and Adherent Slough. The periwound skin appearance did not exhibit: Callus,  Crepitus, Excoriation, Induration, Rash, Scarring, Dry/Scaly, Maceration, Atrophie Blanche, Cyanosis, Ecchymosis, Hemosiderin Staining, Mottled, Pallor, Rubor, Erythema. Periwound temperature was noted as No Abnormality. The periwound has tenderness on palpation. Assessment Active Problems ICD-10 Atherosclerosis of native arteries of left leg with ulceration of other part of lower leg Non-pressure chronic ulcer of other part of left lower leg with other specified severity Type 2 diabetes mellitus with other skin ulcer Patient presents status post Left iliofemoral endarterectomy with femoral to above-the-knee popliteal bypass with PTFE. She had follow-up with vein and vascular 9/20 that reports adequate blood flow for healing. I debrided nonviable tissue. I recommended Dakin's wet-to-dry dressings. Follow-up in 1 week. Procedures Wound #2 Pre-procedure diagnosis of Wound #2 is an Arterial Insufficiency Ulcer located on the Left,Posterior Lower Leg .Severity of Tissue Pre Debridement is: Fat layer exposed. There was a Excisional Skin/Subcutaneous Tissue Debridement with a total area of 45 sq cm performed by Kalman Shan, DO. With the following instrument(s): Blade, Forceps, and Scissors to remove Viable and Non-Viable tissue/material. Material removed includes Eschar, Subcutaneous Tissue, and Slough after achieving pain control using Lidocaine. No  specimens were taken. A time out was conducted at 10:25, prior to the start of the procedure. A Minimum amount of bleeding was controlled with Pressure. The procedure was tolerated well with a pain level of 0 throughout and a pain level of 0 following the procedure. Post Debridement Measurements: 7.5cm length x 6cm width x 0.2cm depth; 7.069cm^3 volume. Character of Wound/Ulcer Post Debridement is improved. Severity of Tissue Post Debridement is: Fat layer exposed. Post procedure Diagnosis Wound #2: Same as Pre-Procedure Plan Follow-up  Appointments: Return Appointment in 1 week. - w/ Dr. Heber [place] and Allayne Butcher Rm # 9 Other: - F/u w/ Vascular in a month Anesthetic: (In clinic) Topical Lidocaine 5% applied to wound bed Bathing/ Shower/ Hygiene: May shower and wash wound with soap and water. - can use antibacterial soap Edema Control - Lymphedema / SCD / Other: Elevate legs to the level of the heart or above for 30 minutes daily and/or when sitting, a frequency of: - throughout the day Avoid standing for long periods of time. Additional Orders / Instructions: Follow Nutritious Diet - Monitor/Control Blood Sugars Home Health: New wound care orders this week; continue Home Health for wound care. May utilize formulary equivalent dressing for wound treatment orders unless otherwise specified. - [facility] to change 2-3 x a week for left leg. The following medication(s) was prescribed: lidocaine topical 5 % gel gel topical for PRN pain/debridments was prescribed at facility WOUND #2: - Lower Leg Wound Laterality: Left, Posterior Cleanser: Soap and Water ([place]) 2 x Per Day/15 Days Discharge Instructions: May shower and wash wound with dial antibacterial soap and water prior to dressing change. Cleanser: Wound Cleanser (Home Health) 2 x Per Day/15 Days Discharge Instructions: Cleanse the wound with wound cleanser prior to applying a clean dressing using gauze sponges, not tissue or cotton balls. Prim Dressing: Dakin's Solution 0.25%, 16 (oz) (Home Health) 2 x Per Day/15 Days ary Discharge Instructions: Moisten gauze with Dakin's solution Secondary Dressing: ABD Pad, 5x9 (Home Health) 2 x Per ZOX/09 Days Discharge Instructions: Apply over primary dressing as directed. Secondary Dressing: Woven Gauze Sponge, Non-Sterile 4x4 in (Home Health) 2 x Per Day/15 Days Discharge Instructions: Apply over primary dressing as directed. Secured With: [employer], 4.5x3.1 (in/yd) (Home Health) 2 x Per Day/15 Days Discharge  Instructions: Secure with Kerlix as directed. Secured With: 69M Medipore H Soft Cloth Surgical T ape, 4 x 10 (in/yd) (Home Health) 2 x Per Day/15 Days Discharge Instructions: Secure with tape as directed. Secured With: Stretch Net Size 5, 10 (yds) (Home Health) 2 x Per UEA/54 Days 1. In office sharp debridement 2. Dakin's wet-to-dry dressings 3. Follow-up in 1 week Electronic Signature(s) Signed: 01/31/2022 4:41:45 PM By: Kalman Shan DO Entered By: Kalman Shan on 01/31/2022 11:23:58 -------------------------------------------------------------------------------- HxROS Details Patient Name: Date of Service: [name] Cavalier RO N J. 01/31/2022 9:15 A M Medical Record Number: [id number] Patient Account Number: [id number] Date of Birth/Sex: Treating RN: 1955-06-04 (66 y.o. [name] Houston, [name] Houston Primary Care Provider: Harlan Stains Other Clinician: Referring Provider: Treating Provider/Extender: [name] Weeks in Treatment: 0 Information Obtained From Patient Caregiver Eyes Complaints and Symptoms: Negative for: Dry Eyes; Vision Changes; Glasses / Contacts Medical History: Negative for: Cataracts; Glaucoma; Optic Neuritis Psychiatric Complaints and Symptoms: Negative for: Claustrophobia Cardiovascular Medical History: Positive for: Hypertension; Peripheral Arterial Disease; Peripheral Venous Disease Negative for: Angina; Arrhythmia; Coronary Artery Disease; Deep Vein Thrombosis; Hypotension; Myocardial Infarction; Phlebitis; Vasculitis Gastrointestinal Medical History: Negative for: Cirrhosis ; Colitis; Crohns; Hepatitis  A; Hepatitis B; Hepatitis C Endocrine Medical History: Positive for: Type II Diabetes - A1C-9.1 Negative for: Type I Diabetes Time with diabetes: 12 years Treated with: Oral agents Blood sugar tested every day: No Genitourinary Medical History: Past Medical History Notes: CKD-3 Immunological Medical History: Negative for:  Lupus Erythematosus; Raynauds; Scleroderma Musculoskeletal Medical History: Positive for: Osteoarthritis; Osteomyelitis - Hx ankle Negative for: Gout; Rheumatoid Arthritis Neurologic Medical History: Negative for: Neuropathy; Quadriplegia; Paraplegia; Seizure Disorder Oncologic Immunizations Pneumococcal Vaccine: Received Pneumococcal Vaccination: No Implantable Devices None Hospitalization / Surgery History Type of Hospitalization/Surgery left knee Family and Social History Cancer: Yes - Father; Diabetes: Yes - Mother,Maternal Grandparents,Father; Heart Disease: No; Hereditary Spherocytosis: No; Hypertension: Yes - Mother; Kidney Disease: No; Lung Disease: No; Seizures: No; Stroke: No; Thyroid Problems: No; Tuberculosis: No; Former smoker; Marital Status - Married; Alcohol Use: Rarely; Drug Use: No History; Caffeine Use: Daily; Financial Concerns: No; Food, Clothing or Shelter Needs: No; Support System Lacking: No; Transportation Concerns: No Electronic Signature(s) Signed: 01/31/2022 4:30:19 PM By: Rhae Hammock RN Signed: 01/31/2022 4:41:45 PM By: Kalman Shan DO Entered By: Rhae Hammock on 01/31/2022 09:35:20 -------------------------------------------------------------------------------- SuperBill Details Patient Name: Date of Service: [name] Kays. 01/31/2022 Medical Record Number: [id number] Patient Account Number: [id number] Date of Birth/Sex: Treating RN: 1955-12-20 (66 y.o. [name] Houston, [name] Houston Primary Care Provider: Harlan Stains Other Clinician: Referring Provider: Treating Provider/Extender: [name] Weeks in Treatment: 0 Diagnosis Coding ICD-10 Codes Code Description [phone] Atherosclerosis of native arteries of left leg with ulceration of other part of lower leg L97.828 Non-pressure chronic ulcer of other part of left lower leg with other specified severity E11.622 Type 2 diabetes mellitus with other skin  ulcer Facility Procedures CPT4 Code: [id number] Description: 27517 - WOUND CARE VISIT-LEV 3 EST PT Modifier: Quantity: 1 CPT4 Code: [id number] Description: 96759 - DEB SUBQ TISSUE 20 SQ CM/< ICD-10 Diagnosis Description L97.828 Non-pressure chronic ulcer of other part of left lower leg with other specified se Modifier: verity Quantity: 1 CPT4 Code: [id number] Description: 99357 - DEB SUBQ TISS EA ADDL 20CM ICD-10 Diagnosis Description L97.828 Non-pressure chronic ulcer of other part of left lower leg with other specified se Modifier: verity Quantity: 2 Physician Procedures : CPT4 Code Description Modifier [id number] 03009 - WC PHYS LEVEL 3 - EST PT ICD-10 Diagnosis Description I70.248 Atherosclerosis of native arteries of left leg with ulceration of other part of lower leg L97.828 Non-pressure chronic ulcer of other part of  left lower leg with other specified severity E11.622 Type 2 diabetes mellitus with other skin ulcer Quantity: 1 : [id number] 11042 - WC PHYS SUBQ TISS 20 SQ CM ICD-10 Diagnosis Description L97.828 Non-pressure chronic ulcer of other part of left lower leg with other specified severity Quantity: 1 : [id number] - WC PHYS SUBQ TISS EA ADDL 20 CM ICD-10 Diagnosis Description L97.828 Non-pressure chronic ulcer of other part of left lower leg with other specified severity Quantity: 2 Electronic Signature(s) Signed: 01/31/2022 4:41:45 PM By: Kalman Shan DO Entered By: Kalman Shan on 01/31/2022 11:24:15

## 2022-02-07 ENCOUNTER — Encounter (HOSPITAL_BASED_OUTPATIENT_CLINIC_OR_DEPARTMENT_OTHER): Payer: HMO | Admitting: Internal Medicine

## 2022-02-07 DIAGNOSIS — L97828 Non-pressure chronic ulcer of other part of left lower leg with other specified severity: Secondary | ICD-10-CM | POA: Diagnosis not present

## 2022-02-07 DIAGNOSIS — I70248 Atherosclerosis of native arteries of left leg with ulceration of other part of lower left leg: Secondary | ICD-10-CM | POA: Diagnosis not present

## 2022-02-07 NOTE — Progress Notes (Signed)
QUIARA, KILLIAN ([id number]) [id number] Page 1 of 9 Visit Report for 02/07/2022 Chief Complaint Document Details Patient Name: Date of Service: [name] Houston, FLEEGER 02/07/2022 11:30 A M Medical Record Number: [id number] Patient Account Number: [id number] Date of Birth/Sex: Treating RN: 01/05/1956 (66 y.o. F) Primary Care Provider: Harlan Stains Other Clinician: Referring Provider: Treating Provider/Extender: [name] Weeks in Treatment: 1 Information Obtained from: Patient Chief Complaint 10/20/2021; Left posterior leg wound 01/31/2022; left posterior leg wound Electronic Signature(s) Signed: 02/07/2022 4:32:14 PM By: Kalman Shan DO Entered By: Kalman Shan on 02/07/2022 12:16:21 -------------------------------------------------------------------------------- Debridement Details Patient Name: Date of Service: [name] Houston RO N J. 02/07/2022 11:30 A M Medical Record Number: [id number] Patient Account Number: [id number] Date of Birth/Sex: Treating RN: October 21, 1955 (66 y.o. Donalda Ewings Primary Care Provider: Harlan Stains Other Clinician: Referring Provider: Treating Provider/Extender: [name] Weeks in Treatment: 1 Debridement Performed for Assessment: Wound #2 Left,Posterior Lower Leg Performed By: Physician Kalman Shan, DO Debridement Type: Debridement Severity of Tissue Pre Debridement: Fat layer exposed Level of Consciousness (Pre-procedure): Awake and Alert Pre-procedure Verification/Time Out Yes - 11:55 Taken: Start Time: 11:55 Pain Control: Lidocaine 5% topical ointment T Area Debrided (L x W): otal 8 (cm) x 6.5 (cm) = 52 (cm) Tissue and other material debrided: Viable, Non-Viable, Eschar, Slough, Subcutaneous, Slough Level: Skin/Subcutaneous Tissue Debridement Description: Excisional Instrument: Blade, Curette, Forceps Bleeding: Minimum Hemostasis Achieved: Pressure End  Time: 12:11 Procedural Pain: 0 Post Procedural Pain: 3 Response to Treatment: Procedure was tolerated well Level of Consciousness (Post- Awake and Alert procedure): Post Debridement Measurements of Total Wound Length: (cm) 8 Width: (cm) 6.5 Depth: (cm) 0.2 Volume: (cm) 8.168 Character of Wound/Ulcer Post Debridement: Requires Further Debridement EDRA, RICCARDI ([id number]) [id number] Page 2 of 9 Severity of Tissue Post Debridement: Fat layer exposed Post Procedure Diagnosis Same as Pre-procedure Electronic Signature(s) Signed: 02/07/2022 3:12:14 PM By: Sharyn Creamer RN, BSN Signed: 02/07/2022 4:32:14 PM By: Kalman Shan DO Entered By: Sharyn Creamer on 02/07/2022 15:10:36 -------------------------------------------------------------------------------- HPI Details Patient Name: Date of Service: [name] Houston RO N J. 02/07/2022 11:30 A M Medical Record Number: [id number] Patient Account Number: [id number] Date of Birth/Sex: Treating RN: August 26, 1955 (66 y.o. F) Primary Care Provider: Harlan Stains Other Clinician: Referring Provider: Treating Provider/Extender: Jake Church in Treatment: 1 History of Present Illness HPI Description: Admission 06/26/2021 [name] Houston is a 66 year old female with a past medical history of uncontrolled type 2 diabetes on oral agents with last hemoglobin A1c of 9, peripheral arterial disease that presents to the clinic for a 3-week history of worsening wound to the posterior aspect of her left leg. She is not sure how it started. She has been using normal saline to the wound bed. She had arterial segmental pressures that showed a left ABI of 0.52 she is scheduled to see vein and vascular on 7/5. She is currently on doxycycline prescribed by her primary care doctor. She currently denies systemic signs of infection. 7/7; patient presents for follow-up. She saw Dr. Donzetta Matters yesterday, vein and  vascular to discuss her arterial status. He is scheduling her for an arteriogram for possible intervention. She has had no issues with the Medihoney. 7/24; patient presents for follow-up. She continues to use Medihoney. She is scheduled to have a stress test on 7/28 in order to be cleared for vascular surgery. Plan is for left common femoral endarterectomy and left common femoral to below the knee popliteal artery bypass. Readmission 01/31/2022 Patient had  a left iliofemoral endarterectomy with vein patch angioplasty and common femoral to above-the-knee popliteal bypass with PTFE by Dr. Donzetta Matters on 12/06/2021. She had follow-up with vein and vascular on 01/18/2022 and it was noted that her left foot is well-perfused with brisk DP and PT Doppler signals. She has follow-up again with vein and vascular at the end of this month for bypass duplex and ABIs. She is currently been using Medihoney to the wound bed. She reports improvement to her chronic pain. She currently denies signs of infection. 10/10; patient presents for follow-up. She has been she has been using Dakin's wet-to-dry dressings however she was having pain on dressing changes. We switched to [facility] in the week. She is done better with this. She has no issues or complaints today. Electronic Signature(s) Signed: 02/07/2022 4:32:14 PM By: Kalman Shan DO Entered By: Kalman Shan on 02/07/2022 12:17:01 -------------------------------------------------------------------------------- Physical Exam Details Patient Name: Date of Service: [name] Houston J. 02/07/2022 11:30 A M Medical Record Number: [id number] Patient Account Number: [id number] Date of Birth/Sex: Treating RN: 05-Jul-1955 (66 y.o. F) Primary Care Provider: Harlan Stains Other Clinician: Referring Provider: Treating Provider/Extender: [name] Weeks in Treatment: 1 Constitutional respirations regular, non-labored and within target range for  patient.. Cardiovascular KELIN, NIXON ([id number]) [id number] Page 3 of 9 2+ dorsalis pedis/posterior tibialis pulses. Psychiatric pleasant and cooperative. Notes Left lower extremity: T the posterior aspect there is an open wound with eschar, nonviable tissue and granulation tissue. o Electronic Signature(s) Signed: 02/07/2022 4:32:14 PM By: Kalman Shan DO Entered By: Kalman Shan on 02/07/2022 12:17:30 -------------------------------------------------------------------------------- Physician Orders Details Patient Name: Date of Service: [name] Houston RO N J. 02/07/2022 11:30 A M Medical Record Number: [id number] Patient Account Number: [id number] Date of Birth/Sex: Treating RN: 07-06-1955 (66 y.o. [name] Primary Care Provider: Harlan Stains Other Clinician: Referring Provider: Treating Provider/Extender: Jake Church in Treatment: 1 Verbal / Phone Orders: No Diagnosis Coding Follow-up Appointments ppointment in 1 week. - w/ Dr. Heber Mandan and Allayne Butcher Rm # 9 Tuesday or Thursday Return A Other: - F/u w/ Vascular in a month 02/22/2022 will send santyl into pharmacy to see if insurance copies it. If it does not use coupon provided to you. If unable to get use hydrofera blue. Anesthetic (In clinic) Topical Lidocaine 5% applied to wound bed Bathing/ Shower/ Hygiene May shower and wash wound with soap and water. - can use antibacterial soap Edema Control - Lymphedema / SCD / Other Elevate legs to the level of the heart or above for 30 minutes daily and/or when sitting, a frequency of: - throughout the day Avoid standing for long periods of time. Additional Orders / Instructions Follow Nutritious Diet - Monitor/Control Blood Sugars Home Health New wound care orders this week; continue Home Health for wound care. May utilize formulary equivalent dressing for wound treatment orders unless otherwise specified. [facility] to change 3 x a week Dressing changes to be completed by [place] on Monday / Wednesday / Friday except when patient has scheduled visit [facility]. Other Home Health Orders/Instructions: - [facility] home health Wound Treatment Wound #2 - Lower Leg Wound Laterality: Left, Posterior Cleanser: Soap and Water (Home Health) 1 x Per Day/30 Days Discharge Instructions: May shower and wash wound with dial antibacterial soap and water prior to dressing change. Cleanser: Wound Cleanser (Home Health) 1 x Per Day/30 Days Discharge Instructions: Cleanse the wound with wound cleanser prior to applying a clean  dressing using gauze sponges, not tissue or cotton balls. Prim Dressing: Hydrofera Blue Ready Foam, 4x5 in (Home Health) 1 x Per Day/30 Days ary Discharge Instructions: Apply to wound bed as instructed Prim Dressing: Santyl Ointment ([place]) 1 x Per Day/30 Days ary Discharge Instructions: Apply nickel thick amount to wound bed apply directly to wound bed once you purchase ointment. If too expensive continue to use hydrofera blue. Secondary Dressing: ABD Pad, 5x9 (Home Health) 1 x Per Day/30 Days Discharge Instructions: Apply over primary dressing as directed. Secondary Dressing: Woven Gauze Sponge, Non-Sterile 4x4 in (Home Health) 1 x Per Day/30 Days Discharge Instructions: Apply over primary dressing as directed. PEARLEAN, SABINA ([id number]) [id number] Page 4 of 9 Secured With: [employer], 4.5x3.1 (in/yd) (Home Health) 1 x Per Day/30 Days Discharge Instructions: Secure with Kerlix as directed. Secured With: 33M Medipore H Soft Cloth Surgical T ape, 4 x 10 (in/yd) (Home Health) 1 x Per Day/30 Days Discharge Instructions: Secure with tape as directed. Secured With: Charity fundraiser Size 5, 10 (yds) (Home Health) 1 x Per Day/30 Days Patient Medications llergies: No Known Allergies A Notifications Medication Indication Start  End 02/07/2022 Santyl DOSE 1 - topical 250 unit/gram ointment - apply daily to the wound bed Electronic Signature(s) Signed: 02/07/2022 4:32:14 PM By: Kalman Shan DO Previous Signature: 02/07/2022 12:19:44 PM Version By: Kalman Shan DO Entered By: Kalman Shan on 02/07/2022 12:19:55 -------------------------------------------------------------------------------- Problem List Details Patient Name: Date of Service: [name] Houston RO N J. 02/07/2022 11:30 A M Medical Record Number: [id number] Patient Account Number: [id number] Date of Birth/Sex: Treating RN: 12/16/1955 (65 y.o. F) Primary Care Provider: Harlan Stains Other Clinician: Referring Provider: Treating Provider/Extender: Jake Church in Treatment: 1 Active Problems ICD-10 Encounter Code Description Active Date MDM Diagnosis I70.248 Atherosclerosis of native arteries of left leg with ulceration of other part of 01/31/2022 No Yes lower leg L97.828 Non-pressure chronic ulcer of other part of left lower leg with other specified 01/31/2022 No Yes severity E11.622 Type 2 diabetes mellitus with other skin ulcer 01/31/2022 No Yes Inactive Problems Resolved Problems Electronic Signature(s) Signed: 02/07/2022 4:32:14 PM By: Kalman Shan DO Entered By: Kalman Shan on 02/07/2022 12:16:02 [name] ([id number]) [id number] Page 5 of 9 -------------------------------------------------------------------------------- Progress Note Details Patient Name: Date of Service: MATTISEN, [name] Houston 02/07/2022 11:30 A M Medical Record Number: [id number] Patient Account Number: [id number] Date of Birth/Sex: Treating RN: [birth year] (66 y.o. F) Primary Care Provider: Harlan Stains Other Clinician: Referring Provider: Treating Provider/Extender: Jake Church in Treatment: 1 Subjective Chief Complaint Information obtained from  Patient 10/20/2021; Left posterior leg wound 01/31/2022; left posterior leg wound History of Present Illness (HPI) Admission 06/26/2021 Ms. Lashya Passe is a 67 year old female with a past medical history of uncontrolled type 2 diabetes on oral agents with last hemoglobin A1c of 9, peripheral arterial disease that presents to the clinic for a 3-week history of worsening wound to the posterior aspect of her left leg. She is not sure how it started. She has been using normal saline to the wound bed. She had arterial segmental pressures that showed a left ABI of 0.52 she is scheduled to see vein and vascular on 7/5. She is currently on doxycycline prescribed by her primary care doctor. She currently denies systemic signs of infection. 7/7; patient presents for follow-up. She saw Dr. Donzetta Matters yesterday, vein and vascular to discuss her arterial status. He is scheduling her for an arteriogram for possible intervention. She  has had no issues with the Medihoney. 7/24; patient presents for follow-up. She continues to use Medihoney. She is scheduled to have a stress test on 7/28 in order to be cleared for vascular surgery. Plan is for left common femoral endarterectomy and left common femoral to below the knee popliteal artery bypass. Readmission 01/31/2022 Patient had a left iliofemoral endarterectomy with vein patch angioplasty and common femoral to above-the-knee popliteal bypass with PTFE by Dr. Donzetta Matters on 12/06/2021. She had follow-up with vein and vascular on 01/18/2022 and it was noted that her left foot is well-perfused with brisk DP and PT Doppler signals. She has follow-up again with vein and vascular at the end of this month for bypass duplex and ABIs. She is currently been using Medihoney to the wound bed. She reports improvement to her chronic pain. She currently denies signs of infection. 10/10; patient presents for follow-up. She has been she has been using Dakin's wet-to-dry dressings however she was  having pain on dressing changes. We switched to [facility] in the week. She is done better with this. She has no issues or complaints today. Patient History Information obtained from Patient, Caregiver. Family History Cancer - Father, Diabetes - Mother,Maternal Grandparents,Father, Hypertension - Mother, No family history of Heart Disease, Hereditary Spherocytosis, Kidney Disease, Lung Disease, Seizures, Stroke, Thyroid Problems, Tuberculosis. Social History Former smoker, Marital Status - Married, Alcohol Use - Rarely, Drug Use - No History, Caffeine Use - Daily. Medical History Eyes Denies history of Cataracts, Glaucoma, Optic Neuritis Cardiovascular Patient has history of Hypertension, Peripheral Arterial Disease, Peripheral Venous Disease Denies history of Angina, Arrhythmia, Coronary Artery Disease, Deep Vein Thrombosis, Hypotension, Myocardial Infarction, Phlebitis, Vasculitis Gastrointestinal Denies history of Cirrhosis , Colitis, Crohnoos, Hepatitis A, Hepatitis B, Hepatitis C Endocrine Patient has history of Type II Diabetes - A1C-9.1 Denies history of Type I Diabetes Immunological Denies history of Lupus Erythematosus, Raynaudoos, Scleroderma Musculoskeletal Patient has history of Osteoarthritis, Osteomyelitis - Hx ankle Denies history of Gout, Rheumatoid Arthritis Neurologic Denies history of Neuropathy, Quadriplegia, Paraplegia, Seizure Disorder Hospitalization/Surgery History - left knee. Medical A Surgical History Notes nd Genitourinary CKD-3 [name] Houston, [name] Houston ([id number]) [id number] Page 6 of 9 Objective Constitutional respirations regular, non-labored and within target range for patient.. Vitals Time Taken: 11:45 AM, Height: 64 in, Weight: 197 lbs, BMI: 33.8, Temperature: 97.9 F, Pulse: 73 bpm, Respiratory Rate: 18 breaths/min, Blood Pressure: 158/91 mmHg. Cardiovascular 2+ dorsalis pedis/posterior tibialis  pulses. Psychiatric pleasant and cooperative. General Notes: Left lower extremity: T the posterior aspect there is an open wound with eschar, nonviable tissue and granulation tissue. o Integumentary (Hair, Skin) Wound #2 status is Open. Original cause of wound was Gradually Appeared. The date acquired was: 11/21/2021. The wound has been in treatment 1 weeks. The wound is located on the Left,Posterior Lower Leg. The wound measures 8cm length x 6.5cm width x 0.2cm depth; 40.841cm^2 area and 8.168cm^3 volume. There is Fat Layer (Subcutaneous Tissue) exposed. There is no tunneling or undermining noted. There is a medium amount of serosanguineous drainage noted. The wound margin is distinct with the outline attached to the wound base. There is large (67-100%) red, pink, hyper - granulation within the wound bed. There is a small (1-33%) amount of necrotic tissue within the wound bed including Eschar and Adherent Slough. The periwound skin appearance did not exhibit: Callus, Crepitus, Excoriation, Induration, Rash, Scarring, Dry/Scaly, Maceration, Atrophie Blanche, Cyanosis, Ecchymosis, Hemosiderin Staining, Mottled, Pallor, Rubor, Erythema. Periwound temperature was noted as No Abnormality. The periwound has tenderness on  palpation. Assessment Active Problems ICD-10 Atherosclerosis of native arteries of left leg with ulceration of other part of lower leg Non-pressure chronic ulcer of other part of left lower leg with other specified severity Type 2 diabetes mellitus with other skin ulcer Patient's wound has shown signs of improvement in size and appearance since last clinic visit. There is a lot of granulation tissue present today. I debrided nonviable tissue. I recommended continuing with Hydrofera Blue but will add Santyl as well. Follow-up in 1 week. Procedures Wound #2 Pre-procedure diagnosis of Wound #2 is an Arterial Insufficiency Ulcer located on the Left,Posterior Lower Leg .Severity of  Tissue Pre Debridement is: Fat layer exposed. There was a Excisional Skin/Subcutaneous Tissue Debridement with a total area of 52 sq cm performed by Kalman Shan, DO. With the following instrument(s): Blade, Curette, and Forceps to remove Viable and Non-Viable tissue/material. Material removed includes Eschar, Subcutaneous Tissue, and Slough after achieving pain control using Lidocaine 5% topical ointment. A time out was conducted at 11:55, prior to the start of the procedure. A Minimum amount of bleeding was controlled with Pressure. The procedure was tolerated well with a pain level of 0 throughout and a pain level of 3 following the procedure. Post Debridement Measurements: 8cm length x 6.5cm width x 0.2cm depth; 8.168cm^3 volume. Character of Wound/Ulcer Post Debridement requires further debridement. Severity of Tissue Post Debridement is: Fat layer exposed. Post procedure Diagnosis Wound #2: Same as Pre-Procedure Plan Follow-up Appointments: Return Appointment in 1 week. - w/ Dr. Heber [place] and Allayne Butcher Rm # 9 Tuesday or Thursday Other: - F/u w/ Vascular in a month 02/22/2022 will send santyl into pharmacy to see if insurance copies it. If it does not use coupon provided to you. If unable to get use hydrofera blue. Anesthetic: (In clinic) Topical Lidocaine 5% applied to wound bed Bathing/ Shower/ Hygiene: May shower and wash wound with soap and water. - can use antibacterial soap Edema Control - Lymphedema / SCD / Other: Elevate legs to the level of the heart or above for 30 minutes daily and/or when sitting, a frequency of: - throughout the day Avoid standing for long periods of time. Additional Orders / Instructions: Follow Nutritious Diet - Monitor/Control Blood Sugars Home Health: New wound care orders this week; continue Home Health for wound care. May utilize formulary equivalent dressing for wound treatment orders unless otherwise specified. [facility] to change 3 x a  week Dressing changes to be completed by [place] on Monday / Wednesday / Friday except when patient has scheduled visit at [facility]. Other Home Health Orders/Instructions: - [address] home health MATEYA, TORTI ([id number]) [id number] Page 7 of 9 The following medication(s) was prescribed: Santyl topical 250 unit/gram ointment 1 apply daily to the wound bed starting 02/07/2022 WOUND #2: - Lower Leg Wound Laterality: Left, Posterior Cleanser: Soap and Water ([place]) 1 x Per Day/30 Days Discharge Instructions: May shower and wash wound with dial antibacterial soap and water prior to dressing change. Cleanser: Wound Cleanser (Home Health) 1 x Per Day/30 Days Discharge Instructions: Cleanse the wound with wound cleanser prior to applying a clean dressing using gauze sponges, not tissue or cotton balls. Prim Dressing: Hydrofera Blue Ready Foam, 4x5 in (Home Health) 1 x Per Day/30 Days ary Discharge Instructions: Apply to wound bed as instructed Prim Dressing: Santyl Ointment ([place]) 1 x Per Day/30 Days ary Discharge Instructions: Apply nickel thick amount to wound bed apply directly to wound bed once you purchase  ointment. If too expensive continue to use hydrofera blue. Secondary Dressing: ABD Pad, 5x9 (Home Health) 1 x Per Day/30 Days Discharge Instructions: Apply over primary dressing as directed. Secondary Dressing: Woven Gauze Sponge, Non-Sterile 4x4 in (Home Health) 1 x Per Day/30 Days Discharge Instructions: Apply over primary dressing as directed. Secured With: [employer], 4.5x3.1 (in/yd) (Home Health) 1 x Per Day/30 Days Discharge Instructions: Secure with Kerlix as directed. Secured With: 59M Medipore H Soft Cloth Surgical T ape, 4 x 10 (in/yd) (Home Health) 1 x Per Day/30 Days Discharge Instructions: Secure with tape as directed. Secured With: Stretch Net Size 5, 10 (yds) (Home Health) 1 x Per Day/30 Days 1. In office sharp  debridement 2. Santyl and Hydrofera Blue 3. Follow-up in 1 week Electronic Signature(s) Signed: 02/07/2022 4:32:14 PM By: Kalman Shan DO Entered By: Kalman Shan on 02/07/2022 12:20:02 -------------------------------------------------------------------------------- HxROS Details Patient Name: Date of Service: [name] Houston RO N J. 02/07/2022 11:30 A M Medical Record Number: [id number] Patient Account Number: [id number] Date of Birth/Sex: Treating RN: Jun 24, 1955 (66 y.o. F) Primary Care Provider: Harlan Stains Other Clinician: Referring Provider: Treating Provider/Extender: Jake Church in Treatment: 1 Information Obtained From Patient Caregiver Eyes Medical History: Negative for: Cataracts; Glaucoma; Optic Neuritis Cardiovascular Medical History: Positive for: Hypertension; Peripheral Arterial Disease; Peripheral Venous Disease Negative for: Angina; Arrhythmia; Coronary Artery Disease; Deep Vein Thrombosis; Hypotension; Myocardial Infarction; Phlebitis; Vasculitis Gastrointestinal Medical History: Negative for: Cirrhosis ; Colitis; Crohns; Hepatitis A; Hepatitis B; Hepatitis C Endocrine Medical History: Positive for: Type II Diabetes - A1C-9.1 Negative for: Type I Diabetes Time with diabetes: 12 years Treated with: Oral agents Blood sugar tested every day: No Genitourinary Medical HistoryLAIA, [name] Houston ([id number]) [id number] Page 8 of 9 Past Medical History Notes: CKD-3 Immunological Medical History: Negative for: Lupus Erythematosus; Raynauds; Scleroderma Musculoskeletal Medical History: Positive for: Osteoarthritis; Osteomyelitis - Hx ankle Negative for: Gout; Rheumatoid Arthritis Neurologic Medical History: Negative for: Neuropathy; Quadriplegia; Paraplegia; Seizure Disorder Immunizations Pneumococcal Vaccine: Received Pneumococcal Vaccination: No Implantable Devices None Hospitalization /  Surgery History Type of Hospitalization/Surgery left knee Family and Social History Cancer: Yes - Father; Diabetes: Yes - Mother,Maternal Grandparents,Father; Heart Disease: No; Hereditary Spherocytosis: No; Hypertension: Yes - Mother; Kidney Disease: No; Lung Disease: No; Seizures: No; Stroke: No; Thyroid Problems: No; Tuberculosis: No; Former smoker; Marital Status - Married; Alcohol Use: Rarely; Drug Use: No History; Caffeine Use: Daily; Financial Concerns: No; Food, Clothing or Shelter Needs: No; Support System Lacking: No; Transportation Concerns: No Electronic Signature(s) Signed: 02/07/2022 4:32:14 PM By: Kalman Shan DO Entered By: Kalman Shan on 02/07/2022 12:17:06 -------------------------------------------------------------------------------- SuperBill Details Patient Name: Date of Service: [name] Houston. 02/07/2022 Medical Record Number: [id number] Patient Account Number: [id number] Date of Birth/Sex: Treating RN: [birth year] (66 y.o. Helene Shoe, Meta.Reding Primary Care Provider: Harlan Stains Other Clinician: Referring Provider: Treating Provider/Extender: [name] Weeks in Treatment: 1 Diagnosis Coding ICD-10 Codes Code Description [phone] Atherosclerosis of native arteries of left leg with ulceration of other part of lower leg L97.828 Non-pressure chronic ulcer of other part of left lower leg with other specified severity E11.622 Type 2 diabetes mellitus with other skin ulcer Facility Procedures : CPT4 Code: [id number] 1 Description: 6967 - DEB SUBQ TISSUE 20 SQ CM/< ICD-10 Diagnosis Description L97.828 Non-pressure chronic ulcer of other part of left lower leg with other specified Modifier: severity Quantity: 1 : Nixon, CPT4 Code: [id number] [place] ([id number] Description: 1045 - DEB SUBQ TISS EA ADDL 20CM 01)  [phone] Modifier: [id number] Quantity: 2 .pdf Page 9 of 9 : CPT4 Code: IC L Description: D-10  Diagnosis Description 97.828 Non-pressure chronic ulcer of other part of left lower leg with other specified sever Modifier: ity Quantity: Physician Procedures : CPT4 Code Description Modifier [id number] 11042 - WC PHYS SUBQ TISS 20 SQ CM ICD-10 Diagnosis Description L97.828 Non-pressure chronic ulcer of other part of left lower leg with other specified severity Quantity: 1 : [id number] 02284 - WC PHYS SUBQ TISS EA ADDL 20 CM ICD-10 Diagnosis Description L97.828 Non-pressure chronic ulcer of other part of left lower leg with other specified severity Quantity: 2 Electronic Signature(s) Signed: 02/07/2022 4:32:14 PM By: Kalman Shan DO Entered By: Kalman Shan on 02/07/2022 12:20:06

## 2022-02-14 ENCOUNTER — Encounter (HOSPITAL_BASED_OUTPATIENT_CLINIC_OR_DEPARTMENT_OTHER): Payer: HMO | Admitting: Internal Medicine

## 2022-02-14 DIAGNOSIS — L97828 Non-pressure chronic ulcer of other part of left lower leg with other specified severity: Secondary | ICD-10-CM

## 2022-02-14 DIAGNOSIS — I70248 Atherosclerosis of native arteries of left leg with ulceration of other part of lower left leg: Secondary | ICD-10-CM | POA: Diagnosis not present

## 2022-02-16 NOTE — Progress Notes (Signed)
[name] Houston ([id number]) [id number] Page 1 of 8 Visit Report for 02/14/2022 Arrival Information Details Patient Name: Date of Service: [name] Houston 02/14/2022 11:30 A M Medical Record Number: [id number] Patient Account Number: [id number] Date of Birth/Sex: Treating RN: Jan 07, 1956 (66 y.o. [name], [name] Primary Care Sheresa Cullop: Harlan Stains Other Clinician: Referring Maeleigh Buschman: Treating Basir Niven/Extender: Jake Church in Treatment: 2 Visit Information History Since Last Visit Added or deleted any medications: No Patient Arrived: [name] Any new allergies or adverse reactions: No Arrival Time: 11:37 Had a fall or experienced change in No Accompanied By: husband activities of daily living that may affect Transfer Assistance: None risk of falls: Patient Identification Verified: Yes Signs or symptoms of abuse/neglect since last visito No Secondary Verification Process Completed: Yes Hospitalized since last visit: No Patient Requires Transmission-Based Precautions: No Implantable device outside of the clinic excluding No Patient Has Alerts: No cellular tissue based products placed in the center since last visit: Has Dressing in Place as Prescribed: Yes Pain Present Now: No Electronic Signature(s) Signed: 02/14/2022 4:15:00 PM By: Erenest Blank Entered By: Erenest Blank on 02/14/2022 11:39:48 -------------------------------------------------------------------------------- Encounter Discharge Information Details Patient Name: Date of Service: [name] Cavalier RO N J. 02/14/2022 11:30 A M Medical Record Number: [id number] Patient Account Number: [id number] Date of Birth/Sex: Treating RN: 1956-01-23 (66 y.o. [name], [name] Primary Care Leisa Gault: Harlan Stains Other Clinician: Referring Dayn Barich: Treating Leah Thornberry/Extender: Jake Church in Treatment: 2 Encounter Discharge  Information Items Post Procedure Vitals Discharge Condition: Stable Temperature (F): 97.7 Ambulatory Status: Ambulatory Pulse (bpm): 76 Discharge Destination: Home Respiratory Rate (breaths/min): 18 Transportation: Private Auto Blood Pressure (mmHg): 147/90 Accompanied By: family Schedule Follow-up Appointment: Yes Clinical Summary of Care: Patient Declined Electronic Signature(s) Signed: 02/16/2022 4:05:37 PM By: Rhae Hammock RN Entered By: Rhae Hammock on 02/14/2022 11:58:35 [name] Houston ([id number]) [id number] Page 2 of 8 -------------------------------------------------------------------------------- Lower Extremity Assessment Details Patient Name: Date of Service: [name] Houston 02/14/2022 11:30 A M Medical Record Number: [id number] Patient Account Number: [id number] Date of Birth/Sex: Treating RN: [birth year] (66 y.o. [name], [name] Primary Care Jordyne Poehlman: Harlan Stains Other Clinician: Referring Maleyah Evans: Treating Dorin Stooksbury/Extender: [name] Weeks in Treatment: 2 Edema Assessment Assessed: [Left: No] [Right: No] Edema: [Left: Ye] [Right: s] Calf Left: Right: Point of Measurement: 29 cm From Medial Instep 41.2 cm Ankle Left: Right: Point of Measurement: 7 cm From Medial Instep 23.5 cm Electronic Signature(s) Signed: 02/14/2022 4:15:00 PM By: Erenest Blank Signed: 02/16/2022 4:05:37 PM By: Rhae Hammock RN Entered By: Erenest Blank on 02/14/2022 11:46:57 -------------------------------------------------------------------------------- Multi Wound Chart Details Patient Name: Date of Service: [name] Cavalier RO N J. 02/14/2022 11:30 A M Medical Record Number: [id number] Patient Account Number: [id number] Date of Birth/Sex: Treating RN: 21-Sep-1955 (66 y.o. [name], [name] Primary Care Hannan Tetzlaff: Harlan Stains Other Clinician: Referring Linwood Gullikson: Treating Alayla Dethlefs/Extender: [name] Weeks in Treatment: 2 Vital Signs Height(in): 64 Capillary Blood Glucose(mg/dl): 79 Weight(lbs): 197 Pulse(bpm): 50 Body Mass Index(BMI): 33.8 Blood Pressure(mmHg): 147/89 Temperature(F): 97.9 Respiratory Rate(breaths/min): 18 [2:Photos:] [N/A:N/A] Left, Posterior Lower Leg N/A N/A Wound Location: Gradually Appeared N/A N/A Wounding Event: Arterial Insufficiency Ulcer N/A N/A Primary Etiology: Hypertension, Peripheral Arterial N/A N/A Comorbid History: Disease, Peripheral Venous Disease, [name] Houston ([id number]) [id number] Page 3 of 8 Type II Diabetes, Osteoarthritis, Osteomyelitis 11/21/2021 N/A N/A Date Acquired: 2 N/A N/A Weeks of Treatment: Open N/A N/A Wound Status: No N/A N/A Wound Recurrence: 6.8x5.8x0.2 N/A N/A Measurements L  x W x D (cm) 30.976 N/A N/A A (cm) : rea 6.195 N/A N/A Volume (cm) : 12.40% N/A N/A % Reduction in A rea: 12.40% N/A N/A % Reduction in Volume: Full Thickness Without Exposed N/A N/A Classification: Support Structures Medium N/A N/A Exudate A mount: Serosanguineous N/A N/A Exudate Type: red, brown N/A N/A Exudate Color: Distinct, outline attached N/A N/A Wound Margin: Large (67-100%) N/A N/A Granulation A mount: Red, Pink, Hyper-granulation N/A N/A Granulation Quality: Small (1-33%) N/A N/A Necrotic A mount: Eschar, Adherent Slough N/A N/A Necrotic Tissue: Fat Layer (Subcutaneous Tissue): Yes N/A N/A Exposed Structures: Fascia: No Tendon: No Muscle: No Joint: No Bone: No Small (1-33%) N/A N/A Epithelialization: Debridement - Excisional N/A N/A Debridement: Pre-procedure Verification/Time Out 11:54 N/A N/A Taken: Lidocaine N/A N/A Pain Control: Subcutaneous, Slough N/A N/A Tissue Debrided: Skin/Subcutaneous Tissue N/A N/A Level: 39.44 N/A N/A Debridement A (sq cm): rea Curette N/A N/A Instrument: Minimum N/A N/A Bleeding: Pressure N/A  N/A Hemostasis A chieved: 0 N/A N/A Procedural Pain: 0 N/A N/A Post Procedural Pain: Procedure was tolerated well N/A N/A Debridement Treatment Response: 6.8x5.8x0.2 N/A N/A Post Debridement Measurements L x W x D (cm) 6.195 N/A N/A Post Debridement Volume: (cm) Excoriation: No N/A N/A Periwound Skin Texture: Induration: No Callus: No Crepitus: No Rash: No Scarring: No Maceration: No N/A N/A Periwound Skin Moisture: Dry/Scaly: No Atrophie Blanche: No N/A N/A Periwound Skin Color: Cyanosis: No Ecchymosis: No Erythema: No Hemosiderin Staining: No Mottled: No Pallor: No Rubor: No No Abnormality N/A N/A Temperature: Yes N/A N/A Tenderness on Palpation: Debridement N/A N/A Procedures Performed: Treatment Notes Wound #2 (Lower Leg) Wound Laterality: Left, Posterior Cleanser Soap and Water Discharge Instruction: May shower and wash wound with dial antibacterial soap and water prior to dressing change. Wound Cleanser Discharge Instruction: Cleanse the wound with wound cleanser prior to applying a clean dressing using gauze sponges, not tissue or cotton balls. Dakin's Solution 0.5%,16 (oz) Peri-Wound Care Topical Primary Dressing Hydrofera Blue Ready Foam, 4x5 in Discharge Instruction: Apply to wound bed as instructed Santyl Ointment Discharge Instruction: Apply nickel thick amount to wound bed apply directly to wound bed once you purchase ointment. If too expensive MOHINI, HEATHCOCK ([id number]) [id number] Page 4 of 8 continue to use hydrofera blue. Secondary Dressing ABD Pad, 5x9 Discharge Instruction: Apply over primary dressing as directed. Woven Gauze Sponge, Non-Sterile 4x4 in Discharge Instruction: Apply over primary dressing as directed. Secured With [employer], 4.5x3.1 (in/yd) Discharge Instruction: Secure with Kerlix as directed. 1M Medipore H Soft Cloth Surgical T ape, 4 x 10 (in/yd) Discharge Instruction: Secure  with tape as directed. Stretch Net Size 5, 10 (yds) Compression Wrap Compression Stockings Add-Ons Electronic Signature(s) Signed: 02/14/2022 12:38:06 PM By: Kalman Shan DO Signed: 02/16/2022 4:05:37 PM By: Rhae Hammock RN Entered By: Kalman Shan on 02/14/2022 12:00:20 -------------------------------------------------------------------------------- Multi-Disciplinary Care Plan Details Patient Name: Date of Service: [name] Cavalier RO N J. 02/14/2022 11:30 A M Medical Record Number: [id number] Patient Account Number: [id number] Date of Birth/Sex: Treating RN: [birth year] (66 y.o. [name], [name] Primary Care Andruw Battie: Harlan Stains Other Clinician: Referring Seger Jani: Treating Christan Defranco/Extender: [name] Weeks in Treatment: 2 Active Inactive Wound/Skin Impairment Nursing Diagnoses: Impaired tissue integrity Knowledge deficit related to ulceration/compromised skin integrity Goals: Patient will have a decrease in wound volume by X% from date: (specify in notes) Date Initiated: 01/31/2022 Target Resolution Date: 03/04/2022 Goal Status: Active Patient/caregiver will verbalize understanding of skin care regimen Date Initiated: 01/31/2022 Target Resolution Date: 03/04/2022  Goal Status: Active Ulcer/skin breakdown will have a volume reduction of 30% by week 4 Date Initiated: 01/31/2022 Target Resolution Date: 03/03/2022 Goal Status: Active Interventions: Assess patient/caregiver ability to obtain necessary supplies Assess patient/caregiver ability to perform ulcer/skin care regimen upon admission and as needed Assess ulceration(s) every visit Notes: Electronic Signature(s) Signed: 02/16/2022 4:05:37 PM By: Rhae Hammock RN Creig Hines, [name] ([id number]) By: Rhae Hammock RN [id number].pdf Page 5 of 8 Signed: 02/16/2022 4:05:37 PM Entered By: Rhae Hammock on 02/14/2022  11:56:04 -------------------------------------------------------------------------------- Pain Assessment Details Patient Name: Date of Service: MARGARIT, MINSHALL 02/14/2022 11:30 A M Medical Record Number: [id number] Patient Account Number: [id number] Date of Birth/Sex: Treating RN: September 30, 1955 (66 y.o. [name], [name] Primary Care Kolby Schara: Harlan Stains Other Clinician: Referring Correne Lalani: Treating Corbett Moulder/Extender: [name] Weeks in Treatment: 2 Active Problems Location of Pain Severity and Description of Pain Patient Has Paino No Site Locations Pain Management and Medication Current Pain Management: Electronic Signature(s) Signed: 02/14/2022 4:15:00 PM By: Erenest Blank Signed: 02/16/2022 4:05:37 PM By: Rhae Hammock RN Entered By: Erenest Blank on 02/14/2022 11:40:21 -------------------------------------------------------------------------------- Patient/Caregiver Education Details Patient Name: Date of Service: [name] 10/17/2023andnbsp11:30 A M Medical Record Number: [id number] Patient Account Number: [id number] Date of Birth/Gender: Treating RN: 09-15-1955 (66 y.o. [name], [name] Primary Care Physician: Harlan Stains Other Clinician: Referring Physician: Treating Physician/Extender: Jake Church in Treatment: 2 Education Assessment Education Provided To: Patient KANAE, IGNATOWSKI ([id number]) [id number] Page 6 of 8 Education Topics Provided Wound/Skin Impairment: Methods: Explain/Verbal Responses: Reinforcements needed, State content correctly Electronic Signature(s) Signed: 02/16/2022 4:05:37 PM By: Rhae Hammock RN Entered By: Rhae Hammock on 02/14/2022 11:56:17 -------------------------------------------------------------------------------- Wound Assessment Details Patient Name: Date of Service: [name] Cavalier RO N J. 02/14/2022 11:30 A  M Medical Record Number: [id number] Patient Account Number: [id number] Date of Birth/Sex: Treating RN: [birth year] (66 y.o. [name], [name] Primary Care Keniel Ralston: Harlan Stains Other Clinician: Referring Vyom Brass: Treating Delle Andrzejewski/Extender: [name] Weeks in Treatment: 2 Wound Status Wound Number: 2 Primary Arterial Insufficiency Ulcer Etiology: Wound Location: Left, Posterior Lower Leg Wound Open Wounding Event: Gradually Appeared Status: Date Acquired: 11/21/2021 Comorbid Hypertension, Peripheral Arterial Disease, Peripheral Venous Weeks Of Treatment: 2 History: Disease, Type II Diabetes, Osteoarthritis, Osteomyelitis Clustered Wound: No Photos Wound Measurements Length: (cm) 6.8 Width: (cm) 5.8 Depth: (cm) 0.2 Area: (cm) 30.976 Volume: (cm) 6.195 % Reduction in Area: 12.4% % Reduction in Volume: 12.4% Epithelialization: Small (1-33%) Tunneling: No Undermining: No Wound Description Classification: Full Thickness Without Exposed Suppor Wound Margin: Distinct, outline attached Exudate Amount: Medium Exudate Type: Serosanguineous Exudate Color: red, brown t Structures Foul Odor After Cleansing: No Slough/Fibrino Yes Wound Bed Granulation Amount: Large (67-100%) Exposed Structure Granulation Quality: Red, Pink, Hyper-granulation Fascia Exposed: No Necrotic Amount: Small (1-33%) Fat Layer (Subcutaneous Tissue) Exposed: Yes Necrotic Quality: Eschar, Adherent Slough Tendon Exposed: No Muscle Exposed: No Joint Exposed: No Bone Exposed: No [address] BERDENA, CISEK ([id number]) [id number] Page 7 of 8 Texture Color No Abnormalities Noted: No No Abnormalities Noted: No Callus: No Atrophie Blanche: No Crepitus: No Cyanosis: No Excoriation: No Ecchymosis: No Induration: No Erythema: No Rash: No Hemosiderin Staining: No Scarring: No Mottled: No Pallor: No Moisture Rubor: No No  Abnormalities Noted: No Dry / Scaly: No Temperature / Pain Maceration: No Temperature: No Abnormality Tenderness on Palpation: Yes Treatment Notes Wound #2 (Lower Leg) Wound Laterality: Left, Posterior Cleanser Soap and Water Discharge Instruction: May shower and wash wound with dial  antibacterial soap and water prior to dressing change. Wound Cleanser Discharge Instruction: Cleanse the wound with wound cleanser prior to applying a clean dressing using gauze sponges, not tissue or cotton balls. Dakin's Solution 0.5%,16 (oz) Peri-Wound Care Topical Primary Dressing Hydrofera Blue Ready Foam, 4x5 in Discharge Instruction: Apply to wound bed as instructed Santyl Ointment Discharge Instruction: Apply nickel thick amount to wound bed apply directly to wound bed once you purchase ointment. If too expensive continue to use hydrofera blue. Secondary Dressing ABD Pad, 5x9 Discharge Instruction: Apply over primary dressing as directed. Woven Gauze Sponge, Non-Sterile 4x4 in Discharge Instruction: Apply over primary dressing as directed. Secured With [employer], 4.5x3.1 (in/yd) Discharge Instruction: Secure with Kerlix as directed. 71M Medipore H Soft Cloth Surgical T ape, 4 x 10 (in/yd) Discharge Instruction: Secure with tape as directed. Stretch Net Size 5, 10 (yds) Compression Wrap Compression Stockings Add-Ons Electronic Signature(s) Signed: 02/14/2022 4:15:00 PM By: Erenest Blank Signed: 02/16/2022 4:05:37 PM By: Rhae Hammock RN Entered By: Erenest Blank on 02/14/2022 11:47:51 -------------------------------------------------------------------------------- Vitals Details Patient Name: Date of Service: [name] Cavalier RO N J. 02/14/2022 11:30 A M Medical Record Number: [id number] Patient Account Number: [id number] Date of Birth/Sex: Treating RN: 02-Jun-1955 (66 y.o. [name], [name] Primary Care Mandeep Ferch: Harlan Stains Other Clinician: Referring  Kaziah Krizek: Treating Cassiopeia Florentino/Extender: [name] [place], [name] ([id number]) [id number] Page 8 of 8 Weeks in Treatment: 2 Vital Signs Time Taken: 11:39 Temperature (F): 97.9 Height (in): 64 Pulse (bpm): 50 Weight (lbs): 197 Respiratory Rate (breaths/min): 18 Body Mass Index (BMI): 33.8 Blood Pressure (mmHg): 147/89 Capillary Blood Glucose (mg/dl): 79 Reference Range: 80 - 120 mg / dl Electronic Signature(s) Signed: 02/14/2022 4:15:00 PM By: Erenest Blank Entered By: Erenest Blank on 02/14/2022 11:40:16

## 2022-02-16 NOTE — Progress Notes (Signed)
[name] Houston, [name] Houston ([id number]) [id number] Page 1 of 9 Visit Report for 02/14/2022 Chief Complaint Document Details Patient Name: Date of Service: [name] Houston, [name] Houston 02/14/2022 11:30 A M Medical Record Number: [id number] Patient Account Number: [id number] Date of Birth/Sex: Treating RN: Houston[birth year] (66 y.o. [name] Houston Primary Care Provider: Harlan Stains Other Clinician: Referring Provider: Treating Provider/Extender: [name] Weeks in Treatment: 2 Information Obtained from: Patient Chief Complaint 10/20/2021; Left posterior leg wound 01/31/2022; left posterior leg wound Electronic Signature(s) Signed: 02/14/2022 12:38:06 PM By: Kalman Shan DO Entered By: Kalman Shan on 02/14/2022 12:00:28 -------------------------------------------------------------------------------- Debridement Details Patient Name: Date of Service: [name] Cavalier RO N [place]. 02/14/2022 11:30 A M Medical Record Number: [id number] Patient Account Number: [id number] Date of Birth/Sex: Treating RN: [birth year][place] (66 y.o. [name] [place] Primary Care Provider: Harlan Stains Other Clinician: Referring Provider: Treating Provider/Extender: [name] Weeks in Treatment: 2 Debridement Performed for Assessment: Wound #2 Left,Posterior Lower Leg Performed By: Physician Kalman Shan, DO Debridement Type: Debridement Severity of Tissue Pre Debridement: Fat layer exposed Level of Consciousness (Pre-procedure): Awake and Alert Pre-procedure Verification/Time Out Yes - 11:54 Taken: Start Time: 11:54 Pain Control: Lidocaine T Area Debrided (L x W): otal 6.8 (cm) x 5.8 (cm) = 39.44 (cm) Tissue and other material debrided: Viable, Non-Viable, Slough, Subcutaneous, Slough Level: Skin/Subcutaneous Tissue Debridement Description: Excisional Instrument: Curette Bleeding: Minimum Hemostasis Achieved: Pressure End Time:  11:54 Procedural Pain: 0 Post Procedural Pain: 0 Response to Treatment: Procedure was tolerated well Level of Consciousness (Post- Awake and Alert procedure): Post Debridement Measurements of Total Wound Length: (cm) 6.8 Width: (cm) 5.8 Depth: (cm) 0.2 Volume: (cm) 6.195 Character of Wound/Ulcer Post Debridement: Improved [name] Houston, [name] Houston ([id number]) [id number] Page 2 of 9 Severity of Tissue Post Debridement: Fat layer exposed Post Procedure Diagnosis Same as Pre-procedure Electronic Signature(s) Signed: 02/14/2022 12:38:06 PM By: Kalman Shan DO Signed: 02/16/2022 4:05:37 PM By: Rhae Hammock RN Entered By: Rhae Hammock on 02/14/2022 11:55:21 -------------------------------------------------------------------------------- HPI Details Patient Name: Date of Service: [name] Cavalier RO N [place]. 02/14/2022 11:30 A M Medical Record Number: [id number] Patient Account Number: [id number] Date of Birth/Sex: Treating RN: [birth year][place] (66 y.o. [name] [place] Primary Care Provider: Harlan Stains Other Clinician: Referring Provider: Treating Provider/Extender: [name] Weeks in Treatment: 2 History of Present Illness HPI Description: Admission 06/26/2021 [name] Houston is a 66 year old female with a past medical history of uncontrolled type 2 diabetes on oral agents with last hemoglobin A1c of 9, peripheral arterial disease that presents to the clinic for a 3-week history of worsening wound to the posterior aspect of her left leg. She is not sure how it started. She has been using normal saline to the wound bed. She had arterial segmental pressures that showed a left ABI of 0.52 she is scheduled to see vein and vascular on 7/5. She is currently on doxycycline prescribed by her primary care doctor. She currently denies systemic signs of infection. 7/7; patient presents for follow-up. She saw Dr. Donzetta Matters yesterday, vein and  vascular to discuss her arterial status. He is scheduling her for an arteriogram for possible intervention. She has had no issues with the Medihoney. 7/24; patient presents for follow-up. She continues to use Medihoney. She is scheduled to have a stress test on 7/28 in order to be cleared for vascular surgery. Plan is for left common femoral endarterectomy and left common femoral to below the knee popliteal artery bypass. Readmission 01/31/2022 Patient had a left iliofemoral endarterectomy with  vein patch angioplasty and common femoral to above-the-knee popliteal bypass with PTFE by Dr. Donzetta Matters on 12/06/2021. She had follow-up with vein and vascular on 01/18/2022 and it was noted that her left foot is well-perfused with brisk DP and PT Doppler signals. She has follow-up again with vein and vascular at the end of this month for bypass duplex and ABIs. She is currently been using Medihoney to the wound bed. She reports improvement to her chronic pain. She currently denies signs of infection. 10/10; patient presents for follow-up. She has been she has been using Dakin's wet-to-dry dressings however she was having pain on dressing changes. We switched to [facility] in the week. She is done better with this. She has no issues or complaints today. 10/17; patient presents for follow-up. She has been using Santyl and Hydrofera Blue to the wound bed. There is been improvement in wound healing. She has no issues or complaints today. Electronic Signature(s) Signed: 02/14/2022 12:38:06 PM By: Kalman Shan DO Entered By: Kalman Shan on 02/14/2022 12:00:48 -------------------------------------------------------------------------------- Physical Exam Details Patient Name: Date of Service: [name] Cavalier RO N [place]. 02/14/2022 11:30 A M Medical Record Number: [id number] Patient Account Number: [id number] Date of Birth/Sex: Treating RN: [name] Houston (66 y.o. [name] Houston Primary Care Provider: Harlan Stains Other Clinician: Referring Provider: Treating Provider/Extender: [name] Weeks in Treatment: 2 Constitutional respirations regular, non-labored and within target range for patient.[name] Houston ([id number]) [id number] Page 3 of 9 Cardiovascular 2+ dorsalis pedis/posterior tibialis pulses. Psychiatric pleasant and cooperative. Notes Left lower extremity: T the posterior aspect there is an open wound with eschar, nonviable tissue and granulation tissue. o Electronic Signature(s) Signed: 02/14/2022 12:38:06 PM By: Kalman Shan DO Entered By: Kalman Shan on 02/14/2022 12:01:11 -------------------------------------------------------------------------------- Physician Orders Details Patient Name: Date of Service: [name] Cavalier RO N [place]. 02/14/2022 11:30 A M Medical Record Number: [id number] Patient Account Number: [id number] Date of Birth/Sex: Treating RN: [birth year] Houston (66 y.o. [name] [place] Primary Care Provider: Harlan Stains Other Clinician: Referring Provider: Treating Provider/Extender: Jake Church in Treatment: 2 Verbal / Phone Orders: No Diagnosis Coding Follow-up Appointments ppointment in 1 week. - w/ Dr. Heber [place] and Allayne Butcher Rm # 9 Tuesday or Thursday Return A Other: - F/u w/ Vascular in a month 02/22/2022 will send santyl into pharmacy to see if insurance copies it. If it does not use coupon provided to you. If unable to get use hydrofera blue. Anesthetic (In clinic) Topical Lidocaine 5% applied to wound bed Bathing/ Shower/ Hygiene May shower and wash wound with soap and water. - can use antibacterial soap Edema Control - Lymphedema / SCD / Other Elevate legs to the level of the heart or above for 30 minutes daily and/or when sitting, a frequency of: - throughout the day Avoid standing for long periods of time. Additional Orders / Instructions Follow Nutritious Diet  - Monitor/Control Blood Sugars Home Health New wound care orders this week; continue Home Health for wound care. May utilize formulary equivalent dressing for wound treatment orders unless otherwise specified. [facility] to change 3 x a week Dressing changes to be completed by [place] on Monday / Wednesday / Friday except when patient has scheduled visit at [facility]. Other Home Health Orders/Instructions: - [facility] home [facility] Wound Treatment Wound #2 - Lower Leg Wound Laterality: Left, Posterior Cleanser: Soap and Water (Home Health) 1 x Per Day/30 Days Discharge Instructions: May shower and wash wound with dial antibacterial soap and  water prior to dressing change. Cleanser: Wound Cleanser (Home Health) 1 x Per Day/30 Days Discharge Instructions: Cleanse the wound with wound cleanser prior to applying a clean dressing using gauze sponges, not tissue or cotton balls. Cleanser: Dakin's Solution 0.5%,16 (oz) (Home Health) 1 x Per Day/30 Days Prim Dressing: Hydrofera Blue Ready Foam, 4x5 in (Home Health) 1 x Per Day/30 Days ary Discharge Instructions: Apply to wound bed as instructed Prim Dressing: Santyl Ointment ([place]) 1 x Per Day/30 Days ary Discharge Instructions: Apply nickel thick amount to wound bed apply directly to wound bed once you purchase ointment. If too expensive continue to use hydrofera blue. Secondary Dressing: ABD Pad, 5x9 [facility]) 1 x Per Day/30 Days [name] Houston, [name] Houston ([id number]) [id number] Page 4 of 9 Discharge Instructions: Apply over primary dressing as directed. Secondary Dressing: Woven Gauze Sponge, Non-Sterile 4x4 in (Home Health) 1 x Per Day/30 Days Discharge Instructions: Apply over primary dressing as directed. Secured With: [employer], 4.5x3.1 (in/yd) (Home Health) 1 x Per Day/30 Days Discharge Instructions: Secure with Kerlix as directed. Secured With: 66M Medipore H Soft Cloth Surgical  T ape, 4 x 10 (in/yd) (Home Health) 1 x Per Day/30 Days Discharge Instructions: Secure with tape as directed. Secured With: [employer] fundraiser Size 5, 10 (yds) (Home Health) 1 x Per Day/30 Days Electronic Signature(s) Signed: 02/14/2022 12:38:06 PM By: Kalman Shan DO Entered By: Kalman Shan on 02/14/2022 12:01:19 -------------------------------------------------------------------------------- Problem List Details Patient Name: Date of Service: [name] Cavalier RO N [place]. 02/14/2022 11:30 A M Medical Record Number: [id number] Patient Account Number: [id number] Date of Birth/Sex: Treating RN: Houston[birth year] (66 y.o. [name] Houston Primary Care Provider: Harlan Stains Other Clinician: Referring Provider: Treating Provider/Extender: [name] Weeks in Treatment: 2 Active Problems ICD-10 Encounter Code Description Active Date MDM Diagnosis I70.248 Atherosclerosis of native arteries of left leg with ulceration of other part of 01/31/2022 No Yes lower leg L97.828 Non-pressure chronic ulcer of other part of left lower leg with other specified 01/31/2022 No Yes severity E11.622 Type 2 diabetes mellitus with other skin ulcer 01/31/2022 No Yes Inactive Problems Resolved Problems Electronic Signature(s) Signed: 02/14/2022 12:38:06 PM By: Kalman Shan DO Entered By: Kalman Shan on 02/14/2022 12:00:15 Progress Note Details -------------------------------------------------------------------------------- [name] Houston ([id number]) [id number] Page 5 of 9 Patient Name: Date of Service: [name] Houston, [name] Houston 02/14/2022 11:30 A M Medical Record Number: [id number] Patient Account Number: [id number] Date of Birth/Sex: Treating RN: [birth year] Houston (66 y.o. [name] Houston Primary Care Provider: Harlan Stains Other Clinician: Referring Provider: Treating Provider/Extender: [name] Weeks in Treatment:  2 Subjective Chief Complaint Information obtained from Patient 10/20/2021; Left posterior leg wound 01/31/2022; left posterior leg wound History of Present Illness (HPI) Admission 06/26/2021 Ms. Tanishia Lemaster is a 66 year old female with a past medical history of uncontrolled type 2 diabetes on oral agents with last hemoglobin A1c of 9, peripheral arterial disease that presents to the clinic for a 3-week history of worsening wound to the posterior aspect of her left leg. She is not sure how it started. She has been using normal saline to the wound bed. She had arterial segmental pressures that showed a left ABI of 0.52 she is scheduled to see vein and vascular on 7/5. She is currently on doxycycline prescribed by her primary care doctor. She currently denies systemic signs of infection. 7/7; patient presents for follow-up. She saw Dr. Donzetta Matters yesterday, vein and vascular to discuss her arterial status. He is scheduling her for  an arteriogram for possible intervention. She has had no issues with the Medihoney. 7/24; patient presents for follow-up. She continues to use Medihoney. She is scheduled to have a stress test on 7/28 in order to be cleared for vascular surgery. Plan is for left common femoral endarterectomy and left common femoral to below the knee popliteal artery bypass. Readmission 01/31/2022 Patient had a left iliofemoral endarterectomy with vein patch angioplasty and common femoral to above-the-knee popliteal bypass with PTFE by Dr. Donzetta Matters on 12/06/2021. She had follow-up with vein and vascular on 01/18/2022 and it was noted that her left foot is well-perfused with brisk DP and PT Doppler signals. She has follow-up again with vein and vascular at the end of this month for bypass duplex and ABIs. She is currently been using Medihoney to the wound bed. She reports improvement to her chronic pain. She currently denies signs of infection. 10/10; patient presents for follow-up. She has been she has  been using Dakin's wet-to-dry dressings however she was having pain on dressing changes. We switched to [employer] in the week. She is done better with this. She has no issues or complaints today. 10/17; patient presents for follow-up. She has been using Santyl and Hydrofera Blue to the wound bed. There is been improvement in wound healing. She has no issues or complaints today. Patient History Information obtained from Patient, Caregiver. Family History Cancer - Father, Diabetes - Mother,Maternal Grandparents,Father, Hypertension - Mother, No family history of Heart Disease, Hereditary Spherocytosis, Kidney Disease, Lung Disease, Seizures, Stroke, Thyroid Problems, Tuberculosis. Social History Former smoker, Marital Status - Married, Alcohol Use - Rarely, Drug Use - No History, Caffeine Use - Daily. Medical History Eyes Denies history of Cataracts, Glaucoma, Optic Neuritis Cardiovascular Patient has history of Hypertension, Peripheral Arterial Disease, Peripheral Venous Disease Denies history of Angina, Arrhythmia, Coronary Artery Disease, Deep Vein Thrombosis, Hypotension, Myocardial Infarction, Phlebitis, Vasculitis Gastrointestinal Denies history of Cirrhosis , Colitis, Crohnoos, Hepatitis A, Hepatitis B, Hepatitis C Endocrine Patient has history of Type II Diabetes - A1C-9.1 Denies history of Type I Diabetes Immunological Denies history of Lupus Erythematosus, Raynaudoos, Scleroderma Musculoskeletal Patient has history of Osteoarthritis, Osteomyelitis - Hx ankle Denies history of Gout, Rheumatoid Arthritis Neurologic Denies history of Neuropathy, Quadriplegia, Paraplegia, Seizure Disorder Hospitalization/Surgery History - left knee. Medical A Surgical History Notes nd Genitourinary CKD-3 Objective Constitutional [name] Houston, [name] Houston ([id number]) [id number] Page 6 of 9 respirations regular, non-labored and within target range for  patient.. Vitals Time Taken: 11:39 AM, Height: 64 in, Weight: 197 lbs, BMI: 33.8, Temperature: 97.9 F, Pulse: 50 bpm, Respiratory Rate: 18 breaths/min, Blood Pressure: 147/89 mmHg, Capillary Blood Glucose: 79 mg/dl. Cardiovascular 2+ dorsalis pedis/posterior tibialis pulses. Psychiatric pleasant and cooperative. General Notes: Left lower extremity: T the posterior aspect there is an open wound with eschar, nonviable tissue and granulation tissue. o Integumentary (Hair, Skin) Wound #2 status is Open. Original cause of wound was Gradually Appeared. The date acquired was: 11/21/2021. The wound has been in treatment 2 weeks. The wound is located on the Left,Posterior Lower Leg. The wound measures 6.8cm length x 5.8cm width x 0.2cm depth; 30.976cm^2 area and 6.195cm^3 volume. There is Fat Layer (Subcutaneous Tissue) exposed. There is no tunneling or undermining noted. There is a medium amount of serosanguineous drainage noted. The wound margin is distinct with the outline attached to the wound base. There is large (67-100%) red, pink, hyper - granulation within the wound bed. There is a small (1-33%) amount of necrotic tissue within the wound bed  including Eschar and Adherent Slough. The periwound skin appearance did not exhibit: Callus, Crepitus, Excoriation, Induration, Rash, Scarring, Dry/Scaly, Maceration, Atrophie Blanche, Cyanosis, Ecchymosis, Hemosiderin Staining, Mottled, Pallor, Rubor, Erythema. Periwound temperature was noted as No Abnormality. The periwound has tenderness on palpation. Assessment Active Problems ICD-10 Atherosclerosis of native arteries of left leg with ulceration of other part of lower leg Non-pressure chronic ulcer of other part of left lower leg with other specified severity Type 2 diabetes mellitus with other skin ulcer Patient's wound has shown improvement in size in appearance since last clinic visit. I debrided nonviable tissue. I recommended continuing with  Santyl and Hydrofera Blue. Follow-up in 1 week. Procedures Wound #2 Pre-procedure diagnosis of Wound #2 is an Arterial Insufficiency Ulcer located on the Left,Posterior Lower Leg .Severity of Tissue Pre Debridement is: Fat layer exposed. There was a Excisional Skin/Subcutaneous Tissue Debridement with a total area of 39.44 sq cm performed by Kalman Shan, DO. With the following instrument(s): Curette to remove Viable and Non-Viable tissue/material. Material removed includes Subcutaneous Tissue and Slough and after achieving pain control using Lidocaine. No specimens were taken. A time out was conducted at 11:54, prior to the start of the procedure. A Minimum amount of bleeding was controlled with Pressure. The procedure was tolerated well with a pain level of 0 throughout and a pain level of 0 following the procedure. Post Debridement Measurements: 6.8cm length x 5.8cm width x 0.2cm depth; 6.195cm^3 volume. Character of Wound/Ulcer Post Debridement is improved. Severity of Tissue Post Debridement is: Fat layer exposed. Post procedure Diagnosis Wound #2: Same as Pre-Procedure Plan Follow-up Appointments: Return Appointment in 1 week. - w/ Dr. Heber [place] and Allayne Butcher Rm # 9 Tuesday or Thursday Other: - F/u w/ Vascular in a month 02/22/2022 will send santyl into pharmacy to see if insurance copies it. If it does not use coupon provided to you. If unable to get use hydrofera blue. Anesthetic: (In clinic) Topical Lidocaine 5% applied to wound bed Bathing/ Shower/ Hygiene: May shower and wash wound with soap and water. - can use antibacterial soap Edema Control - Lymphedema / SCD / Other: Elevate legs to the level of the heart or above for 30 minutes daily and/or when sitting, a frequency of: - throughout the day Avoid standing for long periods of time. Additional Orders / Instructions: Follow Nutritious Diet - Monitor/Control Blood Sugars Home Health: New wound care orders this week; continue  Home Health for wound care. May utilize formulary equivalent dressing for wound treatment orders unless otherwise specified. [facility] to change 3 x a week Dressing changes to be completed by [place] on Monday / Wednesday / Friday except when patient has scheduled visit at [facility]. Other Home Health Orders/Instructions: - [facility] home health WOUND #2: - Lower Leg Wound Laterality: Left, Posterior Cleanser: Soap and Water (Home Health) 1 x Per Day/30 Days Discharge Instructions: May shower and wash wound with dial antibacterial soap and water prior to dressing change. Cleanser: Wound Cleanser (Home Health) 1 x Per Day/30 Days Discharge Instructions: Cleanse the wound with wound cleanser prior to applying a clean dressing using gauze sponges, not tissue or cotton balls. Cleanser: Dakin's Solution 0.5%,16 (oz) [facility]) 1 x Per Day/30 Days [name] Houston, [name] Houston ([id number]) [id number] Page 7 of 9 Prim Dressing: Hydrofera Blue Ready Foam, 4x5 in (Home Health) 1 x Per Day/30 Days ary Discharge Instructions: Apply to wound bed as instructed Prim Dressing: Santyl Ointment ([place]) 1 x Per Day/30 Days ary  Discharge Instructions: Apply nickel thick amount to wound bed apply directly to wound bed once you purchase ointment. If too expensive continue to use hydrofera blue. Secondary Dressing: ABD Pad, 5x9 (Home Health) 1 x Per Day/30 Days Discharge Instructions: Apply over primary dressing as directed. Secondary Dressing: Woven Gauze Sponge, Non-Sterile 4x4 in (Home Health) 1 x Per Day/30 Days Discharge Instructions: Apply over primary dressing as directed. Secured With: [employer], 4.5x3.1 (in/yd) (Home Health) 1 x Per Day/30 Days Discharge Instructions: Secure with Kerlix as directed. Secured With: 55M Medipore H Soft Cloth Surgical T ape, 4 x 10 (in/yd) (Home Health) 1 x Per Day/30 Days Discharge Instructions: Secure with tape as  directed. Secured With: St[employer] Size 5, 10 (yds) (Home Health) 1 x Per Day/30 Days 1. In office sharp debridement 2. Santyl and Hydrofera Blue 3. Follow-up in 1 week Electronic Signature(s) Signed: 02/14/2022 12:38:06 PM By: Kalman Shan DO Entered By: Kalman Shan on 02/14/2022 12:02:04 -------------------------------------------------------------------------------- HxROS Details Patient Name: Date of Service: [name] Cavalier RO N [place]. 02/14/2022 11:30 A M Medical Record Number: [id number] Patient Account Number: [id number] Date of Birth/Sex: Treating RN: [birth year] [place] (66 y.o. [name] [place] Primary Care Provider: Harlan Stains Other Clinician: Referring Provider: Treating Provider/Extender: Jake Church in Treatment: 2 Information Obtained From Patient Caregiver Eyes Medical History: Negative for: Cataracts; Glaucoma; Optic Neuritis Cardiovascular Medical History: Positive for: Hypertension; Peripheral Arterial Disease; Peripheral Venous Disease Negative for: Angina; Arrhythmia; Coronary Artery Disease; Deep Vein Thrombosis; Hypotension; Myocardial Infarction; Phlebitis; Vasculitis Gastrointestinal Medical History: Negative for: Cirrhosis ; Colitis; Crohns; Hepatitis A; Hepatitis B; Hepatitis C Endocrine Medical History: Positive for: Type II Diabetes - A1C-9.1 Negative for: Type I Diabetes Time with diabetes: 12 years Treated with: Oral agents Blood sugar tested every day: No Genitourinary Medical History: Past Medical History Notes: CKD-3 Immunological [name] Houston, [name] Houston ([id number]) [id number] Page 8 of 9 Medical History: Negative for: Lupus Erythematosus; Raynauds; Scleroderma Musculoskeletal Medical History: Positive for: Osteoarthritis; Osteomyelitis - Hx ankle Negative for: Gout; Rheumatoid Arthritis Neurologic Medical History: Negative for: Neuropathy; Quadriplegia; Paraplegia; Seizure  Disorder Immunizations Pneumococcal Vaccine: Received Pneumococcal Vaccination: No Implantable Devices None Hospitalization / Surgery History Type of Hospitalization/Surgery left knee Family and Social History Cancer: Yes - Father; Diabetes: Yes - Mother,Maternal Grandparents,Father; Heart Disease: No; Hereditary Spherocytosis: No; Hypertension: Yes - Mother; Kidney Disease: No; Lung Disease: No; Seizures: No; Stroke: No; Thyroid Problems: No; Tuberculosis: No; Former smoker; Marital Status - Married; Alcohol Use: Rarely; Drug Use: No History; Caffeine Use: Daily; Financial Concerns: No; Food, Clothing or Shelter Needs: No; Support System Lacking: No; Transportation Concerns: No Electronic Signature(s) Signed: 02/14/2022 12:38:06 PM By: Kalman Shan DO Signed: 02/16/2022 4:05:37 PM By: Rhae Hammock RN Entered By: Kalman Shan on 02/14/2022 12:00:53 -------------------------------------------------------------------------------- SuperBill Details Patient Name: Date of Service: [name] Kays. 02/14/2022 Medical Record Number: [id number] Patient Account Number: [id number] Date of Birth/Sex: Treating RN: [birth year] (66 y.o. [name] Houston Primary Care Provider: Harlan Stains Other Clinician: Referring Provider: Treating Provider/Extender: [name] Weeks in Treatment: 2 Diagnosis Coding ICD-10 Codes Code Description [phone] Atherosclerosis of native arteries of left leg with ulceration of other part of lower leg L97.828 Non-pressure chronic ulcer of other part of left lower leg with other specified severity E11.622 Type 2 diabetes mellitus with other skin ulcer Facility Procedures : CPT4 Code: [id number] 1 Description: 4970 - DEB SUBQ TISSUE 20 SQ CM/< ICD-10 Diagnosis Description L97.828 Non-pressure chronic ulcer of other part of left lower  leg with other specified Modifier: severity Quantity: 1 : [place], CPT4 Code: [id number] 1  [name] Houston ([id number] Description: 1045 - DEB SUBQ TISS EA ADDL 20CM ICD-10 Diagnosis Description L97.828 Non-pressure chronic ulcer of other part of left lower leg with other specified 01) [phone] Modifier: severity [id number]. Quantity: 1 pdf Page 9 of 9 Physician Procedures : CPT4 Code Description Modifier [id number] 11042 - WC PHYS SUBQ TISS 20 SQ CM ICD-10 Diagnosis Description L97.828 Non-pressure chronic ulcer of other part of left lower leg with other specified severity Quantity: 1 : [id number] 76226 - WC PHYS SUBQ TISS EA ADDL 20 CM ICD-10 Diagnosis Description L97.828 Non-pressure chronic ulcer of other part of left lower leg with other specified severity Quantity: 1 Electronic Signature(s) Signed: 02/14/2022 12:38:06 PM By: Kalman Shan DO Entered By: Kalman Shan on 02/14/2022 12:02:15

## 2022-02-17 NOTE — Progress Notes (Signed)
[name], Houston ([id number]) [id number] Page 1 of 7 Visit Report for 02/07/2022 Arrival Information Details Patient Name: Date of Service: [name] Houston, [name] Houston 02/07/2022 11:30 A M Medical Record Number: [id number] Patient Account Number: [id number] Date of Birth/Sex: Treating RN: Oct 22, 1955 (66 y.o. F) Primary Care Graceson Nichelson: Harlan Stains Other Clinician: Referring Julies Carmickle: Treating Aurianna Earlywine/Extender: Jake Church in Treatment: 1 Visit Information History Since Last Visit All ordered tests and consults were completed: No Patient Arrived: [name] Added or deleted any medications: No Arrival Time: 11:45 Any new allergies or adverse reactions: No Accompanied By: Husband Had a fall or experienced change in No Transfer Assistance: None activities of daily living that may affect Patient Identification Verified: Yes risk of falls: Secondary Verification Process Completed: Yes Signs or symptoms of abuse/neglect since last visito No Patient Requires Transmission-Based Precautions: No Hospitalized since last visit: No Patient Has Alerts: No Has Dressing in Place as Prescribed: Yes Has Compression in Place as Prescribed: No Pain Present Now: Yes Electronic Signature(s) Signed: 02/17/2022 11:35:40 AM By: Worthy Rancher Entered By: Worthy Rancher on 02/07/2022 11:47:34 -------------------------------------------------------------------------------- Encounter Discharge Information Details Patient Name: Date of Service: [name] Houston. 02/07/2022 11:30 A M Medical Record Number: [id number] Patient Account Number: [id number] Date of Birth/Sex: Treating RN: 1956/01/29 (66 y.o. [name] Primary Care Karyss Frese: Harlan Stains Other Clinician: Referring Avanti Jetter: Treating Denaly Gatling/Extender: Jake Church in Treatment: 1 Encounter Discharge Information Items Post Procedure Vitals Discharge Condition:  Stable Temperature (F): 97.9 Ambulatory Status: Walker Pulse (bpm): 73 Discharge Destination: Home Respiratory Rate (breaths/min): 18 Transportation: Private Auto Blood Pressure (mmHg): 158/91 Accompanied By: husband Schedule Follow-up Appointment: Yes Clinical Summary of Care: Electronic Signature(s) Signed: 02/07/2022 6:12:04 PM By: Deon Pilling RN, BSN Entered By: Deon Pilling on 02/07/2022 12:16:59 [name] ([id number]) [id number] Page 2 of 7 -------------------------------------------------------------------------------- Lower Extremity Assessment Details Patient Name: Date of Service: [name], Houston 02/07/2022 11:30 A M Medical Record Number: [id number] Patient Account Number: [id number] Date of Birth/Sex: Treating RN: 1956-04-13 (66 y.o. [name] Primary Care Desiderio Dolata: Harlan Stains Other Clinician: Referring Ichelle Harral: Treating Dale Strausser/Extender: [name] Weeks in Treatment: 1 Edema Assessment Assessed: [Left: Yes] [Right: No] Edema: [Left: Ye] [Right: s] Calf Left: Right: Point of Measurement: 29 cm From Medial Instep 43 cm Ankle Left: Right: Point of Measurement: 7 cm From Medial Instep 23 cm Vascular Assessment Pulses: Dorsalis Pedis Palpable: [Left:Yes] Electronic Signature(s) Signed: 02/07/2022 6:12:04 PM By: Deon Pilling RN, BSN Entered By: Deon Pilling on 02/07/2022 12:00:26 -------------------------------------------------------------------------------- Multi Wound Chart Details Patient Name: Date of Service: [name] Sjogren Houston. 02/07/2022 11:30 A M Medical Record Number: [id number] Patient Account Number: [id number] Date of Birth/Sex: Treating RN: 10-20-1955 (66 y.o. F) Primary Care Royston Bekele: Harlan Stains Other Clinician: Referring Icy Fuhrmann: Treating Creola Krotz/Extender: [name] Weeks in Treatment: 1 Vital Signs Height(in): 64 Pulse(bpm):  72 Weight(lbs): 197 Blood Pressure(mmHg): 158/91 Body Mass Index(BMI): 33.8 Temperature(F): 97.9 Respiratory Rate(breaths/min): 18 [2:Photos:] [N/A:N/A] Left, Posterior Lower Leg N/A N/A Wound Location: Gradually Appeared N/A N/A Wounding Event: Arterial Insufficiency Ulcer N/A N/A Primary Etiology: Hypertension, Peripheral Arterial N/A N/A Comorbid History: Disease, Peripheral Venous Disease, Type II Diabetes, Osteoarthritis, Osteomyelitis 11/21/2021 N/A N/A Date Acquired: 1 N/A N/A Weeks of Treatment: Open N/A N/A Wound Status: No N/A N/A Wound Recurrence: 8x6.5x0.2 N/A N/A Measurements L x W x D (cm) 40.841 N/A N/A A (cm) : rea 8.168 N/A N/A Volume (cm) : -15.60% N/A N/A %  Reduction in A rea: -15.50% N/A N/A % Reduction in Volume: Full Thickness Without Exposed N/A N/A Classification: Support Structures Medium N/A N/A Exudate A mount: Serosanguineous N/A N/A Exudate Type: red, brown N/A N/A Exudate Color: Distinct, outline attached N/A N/A Wound Margin: Large (67-100%) N/A N/A Granulation A mount: Red, Pink, Hyper-granulation N/A N/A Granulation Quality: Small (1-33%) N/A N/A Necrotic A mount: Eschar, Adherent Slough N/A N/A Necrotic Tissue: Fat Layer (Subcutaneous Tissue): Yes N/A N/A Exposed Structures: Fascia: No Tendon: No Muscle: No Joint: No Bone: No Small (1-33%) N/A N/A Epithelialization: Debridement - Excisional N/A N/A Debridement: Pre-procedure Verification/Time Out 11:55 N/A N/A Taken: Lidocaine 5% topical ointment N/A N/A Pain Control: Necrotic/Eschar, Subcutaneous, N/A N/A Tissue Debrided: Slough Skin/Subcutaneous Tissue N/A N/A Level: 52 N/A N/A Debridement A (sq cm): rea Blade, Curette, Forceps N/A N/A Instrument: Minimum N/A N/A Bleeding: Pressure N/A N/A Hemostasis Achieved: 0 N/A N/A Procedural Pain: 3 N/A N/A Post Procedural Pain: Debridement Treatment Response: Procedure was tolerated well N/A  N/A Post Debridement Measurements L x 8x6.5x0.2 N/A N/A W x D (cm) 8.168 N/A N/A Post Debridement Volume: (cm) Excoriation: No N/A N/A Periwound Skin Texture: Induration: No Callus: No Crepitus: No Rash: No Scarring: No Maceration: No N/A N/A Periwound Skin Moisture: Dry/Scaly: No Atrophie Blanche: No N/A N/A Periwound Skin Color: Cyanosis: No Ecchymosis: No Erythema: No Hemosiderin Staining: No Mottled: No Pallor: No Rubor: No No Abnormality N/A N/A Temperature: Yes N/A N/A Tenderness on Palpation: Debridement N/A N/A Procedures Performed: Treatment Notes Electronic Signature(s) Signed: 02/07/2022 4:32:14 PM By: Kalman Shan DO Entered By: Kalman Shan on 02/07/2022 12:16:10 [name] ([id number]) [id number] Page 4 of 7 -------------------------------------------------------------------------------- Multi-Disciplinary Care Plan Details Patient Name: Date of Service: EVAMARIA, DETORE 02/07/2022 11:30 A M Medical Record Number: [id number] Patient Account Number: [id number] Date of Birth/Sex: Treating RN: Feb 17, 1956 (66 y.o. Helene Shoe, Meta.Reding Primary Care Hayzlee Mcsorley: Harlan Stains Other Clinician: Referring Charise Leinbach: Treating Ernestene Coover/Extender: [name] Weeks in Treatment: 1 Active Inactive Wound/Skin Impairment Nursing Diagnoses: Impaired tissue integrity Knowledge deficit related to ulceration/compromised skin integrity Goals: Patient will have a decrease in wound volume by X% from date: (specify in notes) Date Initiated: 01/31/2022 Target Resolution Date: 03/04/2022 Goal Status: Active Patient/caregiver will verbalize understanding of skin care regimen Date Initiated: 01/31/2022 Target Resolution Date: 03/04/2022 Goal Status: Active Ulcer/skin breakdown will have a volume reduction of 30% by week 4 Date Initiated: 01/31/2022 Target Resolution Date: 03/03/2022 Goal Status:  Active Interventions: Assess patient/caregiver ability to obtain necessary supplies Assess patient/caregiver ability to perform ulcer/skin care regimen upon admission and as needed Assess ulceration(s) every visit Notes: Electronic Signature(s) Signed: 02/07/2022 6:12:04 PM By: Deon Pilling RN, BSN Entered By: Deon Pilling on 02/07/2022 12:01:39 -------------------------------------------------------------------------------- Pain Assessment Details Patient Name: Date of Service: [name] [place]. 02/07/2022 11:30 A M Medical Record Number: [id number] Patient Account Number: [id number] Date of Birth/Sex: Treating RN: [birth year] (66 y.o. F) Primary Care Dylyn Mclaren: Harlan Stains Other Clinician: Referring Emile Kyllo: Treating Jamilyn Pigeon/Extender: [name] Weeks in Treatment: 1 Active Problems Location of Pain Severity and Description of Pain Patient Has Paino Yes Site Locations Pain Location: MYLI, PAE ([id number]) [id number] Page 5 of 7 Pain Location: Pain in Ulcers Rate the pain. Current Pain Level: 7 Worst Pain Level: 10 Least Pain Level: 0 Tolerable Pain Level: 6 Pain Management and Medication Current Pain Management: Medication: No Cold Application: No Rest: No Massage: No Activity: No T.E.N.S.: No Heat Application: No Leg drop or  elevation: No Is the Current Pain Management Adequate: Inadequate How does your wound impact your activities of daily livingo Sleep: No Bathing: No Appetite: No Relationship With Others: No Bladder Continence: No Emotions: No Bowel Continence: No Work: No Toileting: No Drive: No Dressing: No Hobbies: No Electronic Signature(s) Signed: 02/17/2022 11:35:40 AM By: Worthy Rancher Entered By: Worthy Rancher on 02/07/2022 11:49:43 -------------------------------------------------------------------------------- Patient/Caregiver Education Details Patient Name: Date of  Service: [name] 10/10/2023andnbsp11:30 A M Medical Record Number: [id number] Patient Account Number: [id number] Date of Birth/Gender: Treating RN: [birth year] (66 y.o. [name] Primary Care Physician: Harlan Stains Other Clinician: Referring Physician: Treating Physician/Extender: Jake Church in Treatment: 1 Education Assessment Education Provided To: Patient Education Topics Provided Wound/Skin Impairment: Handouts: Skin Care Do's and Dont's Methods: Explain/Verbal Responses: Reinforcements needed Electronic Signature(s) Signed: 02/07/2022 6:12:04 PM By: Deon Pilling RN, BSN Entered By: Deon Pilling on 02/07/2022 12:01:48 [name] ([id number]) [id number] Page 6 of 7 -------------------------------------------------------------------------------- Wound Assessment Details Patient Name: Date of Service: DANAISHA, CELLI 02/07/2022 11:30 A M Medical Record Number: [id number] Patient Account Number: [id number] Date of Birth/Sex: Treating RN: 1955-05-08 (66 y.o. F) Primary Care Stanislawa Gaffin: Harlan Stains Other Clinician: Referring Keneisha Heckart: Treating Magin Balbi/Extender: [name] Weeks in Treatment: 1 Wound Status Wound Number: 2 Primary Arterial Insufficiency Ulcer Etiology: Wound Location: Left, Posterior Lower Leg Wound Open Wounding Event: Gradually Appeared Status: Date Acquired: 11/21/2021 Comorbid Hypertension, Peripheral Arterial Disease, Peripheral Venous Weeks Of Treatment: 1 History: Disease, Type II Diabetes, Osteoarthritis, Osteomyelitis Clustered Wound: No Photos Wound Measurements Length: (cm) 8 Width: (cm) 6.5 Depth: (cm) 0.2 Area: (cm) 40.841 Volume: (cm) 8.168 % Reduction in Area: -15.6% % Reduction in Volume: -15.5% Epithelialization: Small (1-33%) Tunneling: No Undermining: No Wound Description Classification: Full Thickness Without  Exposed Suppor Wound Margin: Distinct, outline attached Exudate Amount: Medium Exudate Type: Serosanguineous Exudate Color: red, brown t Structures Foul Odor After Cleansing: No Slough/Fibrino Yes Wound Bed Granulation Amount: Large (67-100%) Exposed Structure Granulation Quality: Red, Pink, Hyper-granulation Fascia Exposed: No Necrotic Amount: Small (1-33%) Fat Layer (Subcutaneous Tissue) Exposed: Yes Necrotic Quality: Eschar, Adherent Slough Tendon Exposed: No Muscle Exposed: No Joint Exposed: No Bone Exposed: No Periwound Skin Texture Texture Color No Abnormalities Noted: No No Abnormalities Noted: No Callus: No Atrophie Blanche: No Crepitus: No Cyanosis: No Excoriation: No Ecchymosis: No Induration: No Erythema: No Rash: No Hemosiderin Staining: No Scarring: No Mottled: No Pallor: No Moisture Rubor: No No Abnormalities Noted: No Dry / Scaly: No Temperature / Pain Maceration: No Temperature: No Abnormality TINE, MABEE ([id number]) [id number] Page 7 of 7 Tenderness on Palpation: Yes Electronic Signature(s) Signed: 02/07/2022 6:12:04 PM By: Deon Pilling RN, BSN Entered By: Deon Pilling on 02/07/2022 12:01:12 -------------------------------------------------------------------------------- Vitals Details Patient Name: Date of Service: [name] [place]. 02/07/2022 11:30 A M Medical Record Number: [id number] Patient Account Number: [id number] Date of Birth/Sex: Treating RN: 08-Jan-1956 (66 y.o. F) Primary Care Gerrit Rafalski: Harlan Stains Other Clinician: Referring Caryl Fate: Treating Cloys Vera/Extender: [name] Weeks in Treatment: 1 Vital Signs Time Taken: 11:45 Temperature (F): 97.9 Height (in): 64 Pulse (bpm): 73 Weight (lbs): 197 Respiratory Rate (breaths/min): 18 Body Mass Index (BMI): 33.8 Blood Pressure (mmHg): 158/91 Reference Range: 80 - 120 mg / dl Electronic Signature(s) Signed:  02/17/2022 11:35:40 AM By: Worthy Rancher Entered By: Worthy Rancher on 02/07/2022 11:48:45

## 2022-02-21 ENCOUNTER — Encounter (HOSPITAL_BASED_OUTPATIENT_CLINIC_OR_DEPARTMENT_OTHER): Payer: HMO | Admitting: Internal Medicine

## 2022-02-22 ENCOUNTER — Encounter: Payer: Self-pay | Admitting: Vascular Surgery

## 2022-02-22 ENCOUNTER — Ambulatory Visit (HOSPITAL_COMMUNITY)
Admission: RE | Admit: 2022-02-22 | Discharge: 2022-02-22 | Disposition: A | Discharge status: Home / Self Care | Payer: HMO | Source: Ambulatory Visit | Attending: Vascular Surgery | Admitting: Vascular Surgery

## 2022-02-22 ENCOUNTER — Ambulatory Visit (INDEPENDENT_AMBULATORY_CARE_PROVIDER_SITE_OTHER)
Admission: RE | Admit: 2022-02-22 | Discharge: 2022-02-22 | Disposition: A | Discharge status: Home / Self Care | Payer: HMO | Source: Ambulatory Visit | Attending: Vascular Surgery | Admitting: Vascular Surgery

## 2022-02-22 ENCOUNTER — Ambulatory Visit (INDEPENDENT_AMBULATORY_CARE_PROVIDER_SITE_OTHER): Payer: HMO | Admitting: Physician Assistant

## 2022-02-22 VITALS — BP 137/69 | HR 68 | Temp 97.8°F | Resp 18 | Ht 64.0 in | Wt 202.1 lb

## 2022-02-22 DIAGNOSIS — I739 Peripheral vascular disease, unspecified: Secondary | ICD-10-CM

## 2022-02-22 DIAGNOSIS — I70262 Atherosclerosis of native arteries of extremities with gangrene, left leg: Secondary | ICD-10-CM | POA: Diagnosis present

## 2022-02-22 NOTE — Progress Notes (Signed)
HISTORY AND PHYSICAL     CC:  follow up. Requesting Provider:  Harlan Stains, MD  HPI: This is a 66 y.o. female status post left iliofemoral endarterectomy with vein patch angioplasty and common femoral to above-the-knee popliteal bypass with PTFE by Dr. Donzetta Matters on 12/06/2021 due to left calf ulceration.  She then required washout of the left groin and wound VAC placement on 12/13/2021.  We have since discontinued the wound VAC.  She now goes to the wound center for wound care of the left calf wound.  She has a picture on her phone which demonstrates about 80 to 85% granulation tissue.  She believes her left groin has healed.  She denies rest pain or any further tissue loss in her left foot.  She is taking an aspirin and a statin daily.  She would like to be out of work until next year.   The pt is on a statin for cholesterol management.    The pt is on an aspirin.    Other AC:  none The pt is on CCB for hypertension.  The pt does  have diabetes. Tobacco hx:  former   Past Medical History:  Diagnosis Date   Diabetes mellitus without complication ([place])    GERD (gastroesophageal reflux disease)    Hiatal hernia 05/01/2013   DG Esophagus    Hyperlipidemia    Hypertension    Kidney disease 05/02/2011    Past Surgical History:  Procedure Laterality Date   ABDOMINAL AORTOGRAM W/LOWER EXTREMITY N/A 11/14/2021   Procedure: ABDOMINAL AORTOGRAM W/LOWER EXTREMITY;  Surgeon: Waynetta Sandy, MD;  Location: [facility];  Service: Cardiovascular;  Laterality: N/A;   BREAST ABSESS DRAINED  7/08   CHOLECYSTECTOMY     ENDARTERECTOMY FEMORAL Left 12/06/2021   Procedure: LEFT COMMON FEMORAL ENDARTERECTOMY;  Surgeon: Waynetta Sandy, MD;  Location: [facility];  Service: Vascular;  Laterality: Left;   FEMORAL-POPLITEAL BYPASS GRAFT Left 12/06/2021   Procedure: LEFT COMMON FEMORAL-BELOW KNEE POPLITEAL ARTERY BYPASS;  Surgeon: Waynetta Sandy, MD;  Location: [facility];  Service:  Vascular;  Laterality: Left;   GROIN DEBRIDEMENT Left 12/13/2021   Procedure: Karl Ito;  Surgeon: Waynetta Sandy, MD;  Location: [facility];  Service: Vascular;  Laterality: Left;   ORIF TIBIA PLATEAU  3/09    Allergies  Allergen Reactions   Morphine Nausea And Vomiting    Current Outpatient Medications  Medication Sig Dispense Refill   acetaminophen (TYLENOL) 500 MG tablet Take 1,000 mg by mouth every 6 (six) hours as needed for moderate pain.     amLODipine (NORVASC) 10 MG tablet Take 10 mg by mouth daily.     aspirin 81 MG tablet Take 81 mg by mouth daily.     atorvastatin (LIPITOR) 40 MG tablet Take 40 mg by mouth daily.     chlorthalidone (HYGROTON) 25 MG tablet Take 25 mg by mouth daily.     cholecalciferol (VITAMIN D3) 25 MCG (1000 UNIT) tablet Take 1,000 Units by mouth daily.     ciprofloxacin (CIPRO) 500 MG tablet Take 1 tablet (500 mg total) by mouth 2 (two) times daily. 22 tablet 0   Cyanocobalamin (B-12-SL) 1000 MCG SUBL Place 1,000 mcg under the tongue daily.     dapagliflozin propanediol (FARXIGA) 10 MG TABS tablet Take 10 mg by mouth daily.     famotidine (PEPCID) 40 MG tablet Take 40 mg by mouth See admin instructions. Take 40 mg daily, may take a second 40 mg dose  as needed for heartburn     glipiZIDE (GLUCOTROL) 5 MG tablet Take 5 mg by mouth 2 (two) times daily.     Magnesium 250 MG TABS Take 250 mg by mouth 2 (two) times daily.     oxyCODONE-acetaminophen (PERCOCET/ROXICET) 5-325 MG tablet Take 1 tablet by mouth every 6 (six) hours as needed for severe pain. 10 tablet 0   pioglitazone (ACTOS) 15 MG tablet Take 15 mg by mouth daily.     potassium chloride SA (KLOR-CON M) 20 MEQ tablet Take 20 mEq by mouth daily.     Semaglutide,0.25 or 0.'5MG'$ /DOS, (OZEMPIC, 0.25 OR 0.5 MG/DOSE,) 2 MG/3ML SOPN Take 0.5 mg by mouth every Thursday.     No current facility-administered medications for this visit.    Family History  Problem Relation Age of Onset    Hypertension Mother    Diabetes Mother    CVA Father    Hypertension Father    Diabetes Father    Pancreatic cancer Father    Pulmonary embolism Father    Bone cancer Maternal Aunt    Diabetes Paternal Grandmother     Social History   Socioeconomic History   Marital status: Married    Spouse name: Not on file   Number of children: Not on file   Years of education: Not on file   Highest education level: Not on file  Occupational History   Not on file  Tobacco Use   Smoking status: Former    Packs/day: 0.50    Types: Cigarettes   Smokeless tobacco: Never  Vaping Use   Vaping Use: Never used  Substance and Sexual Activity   Alcohol use: Yes    Comment: occ   Drug use: No   Sexual activity: Not on file  Other Topics Concern   Not on file  Social History Narrative   Not on file   Social Determinants of Health   Financial Resource Strain: Not on file  Food Insecurity: Not on file  Transportation Needs: Not on file  Physical Activity: Not on file  Stress: Not on file  Social Connections: Not on file  Intimate Partner Violence: Not on file    PHYSICAL EXAMINATION:  Today's Vitals   02/22/22 1314  BP: 137/69  Pulse: 68  Resp: 18  Temp: 97.8 F (36.6 C)  TempSrc: Temporal  SpO2: 100%  Weight: 202 lb 1.6 oz (91.7 kg)  Height: '5\' 4"'$  (1.626 m)  PainSc: 5    Body mass index is 34.69 kg/m.   General:  WDWN in NAD; vital signs documented above Gait: Not observed HENT: WNL, normocephalic Pulmonary: normal non-labored breathing , without wheezing Skin: without rashes Vascular Exam/Pulses: Palpable left DP pulse Extremities: Left groin incision healed; dressing left in place left calf   Non-Invasive Vascular Imaging:   Widely patent left leg bypass without any hemodynamically significant stenosis Left ABI was deferred due to left calf wound   ASSESSMENT/PLAN:: 66 y.o. female status post left leg bypass for left calf wound  -Left foot well-perfused with  palpable DP pulse -Left groin incision healed -Based on the picture on the patient's phone the left calf wound has 80 to 85% granulation tissue and appears to be healing well.  Continue weekly wound care with the wound clinic -Patient would like a work note to keep her out until the new year and to specify avoid prolonged standing at work.  She works as a Scientist, water quality.  We can provide this work note however will not  provide disability paperwork as she technically could return to work from our standpoint -She will follow-up in 3 months with a repeat bypass duplex.  She will call/return office sooner if the left calf wound worsens or fails to heal  Dagoberto Ligas, [facility] [phone]  Clinic MD:   Donzetta Matters

## 2022-02-24 ENCOUNTER — Other Ambulatory Visit: Payer: Self-pay

## 2022-02-24 DIAGNOSIS — I739 Peripheral vascular disease, unspecified: Secondary | ICD-10-CM

## 2022-02-24 DIAGNOSIS — I70262 Atherosclerosis of native arteries of extremities with gangrene, left leg: Secondary | ICD-10-CM

## 2022-02-28 ENCOUNTER — Encounter (HOSPITAL_BASED_OUTPATIENT_CLINIC_OR_DEPARTMENT_OTHER): Payer: HMO | Admitting: Internal Medicine

## 2022-02-28 DIAGNOSIS — L97828 Non-pressure chronic ulcer of other part of left lower leg with other specified severity: Secondary | ICD-10-CM

## 2022-02-28 DIAGNOSIS — I70248 Atherosclerosis of native arteries of left leg with ulceration of other part of lower left leg: Secondary | ICD-10-CM | POA: Diagnosis not present

## 2022-03-03 NOTE — Progress Notes (Signed)
NUBIA, ZIESMER ([id number]) [id number] Page 1 of 9 Visit Report for 02/28/2022 Chief Complaint Document Details Patient Name: Date of Service: [name] Houston, [name] Houston 02/28/2022 12:30 PM Medical Record Number: [id number] Patient Account Number: [id number] Date of Birth/Sex: Treating RN: 1956-01-27 (66 y.o. F) Primary Care Provider: Harlan Stains Other Clinician: Referring Provider: Treating Provider/Extender: [name] Weeks in Treatment: 4 Information Obtained from: Patient Chief Complaint 10/20/2021; Left posterior leg wound 01/31/2022; left posterior leg wound Electronic Signature(s) Signed: 03/03/2022 1:40:10 PM By: Kalman Shan DO Entered By: Kalman Shan on 02/28/2022 13:42:39 -------------------------------------------------------------------------------- Debridement Details Patient Name: Date of Service: [name] Cavalier RO N J. 02/28/2022 12:30 PM Medical Record Number: [id number] Patient Account Number: [id number] Date of Birth/Sex: Treating RN: 03-Jun-1955 (66 y.o. Helene Shoe, Meta.Reding Primary Care Provider: Harlan Stains Other Clinician: Referring Provider: Treating Provider/Extender: [name] Weeks in Treatment: 4 Debridement Performed for Assessment: Wound #2 Left,Posterior Lower Leg Performed By: Physician Kalman Shan, DO Debridement Type: Debridement Severity of Tissue Pre Debridement: Fat layer exposed Level of Consciousness (Pre-procedure): Awake and Alert Pre-procedure Verification/Time Out Yes - 13:15 Taken: Start Time: 13:16 Pain Control: Lidocaine 5% topical ointment T Area Debrided (L x W): otal 6.8 (cm) x 5.3 (cm) = 36.04 (cm) Tissue and other material debrided: Viable, Non-Viable, Eschar, Slough, Subcutaneous, Skin: Dermis , Skin: Epidermis, Slough Level: Skin/Subcutaneous Tissue Debridement Description: Excisional Instrument: Curette Bleeding: Minimum Hemostasis  Achieved: Pressure End Time: 13:22 Procedural Pain: 0 Post Procedural Pain: 0 Response to Treatment: Procedure was tolerated well Level of Consciousness (Post- Awake and Alert procedure): Post Debridement Measurements of Total Wound Length: (cm) 6.8 Width: (cm) 5.3 Depth: (cm) 0.2 Volume: (cm) 5.661 Character of Wound/Ulcer Post Debridement: Improved TRACHELLE, LOW ([id number]) [id number] Page 2 of 9 Severity of Tissue Post Debridement: Fat layer exposed Post Procedure Diagnosis Same as Pre-procedure Electronic Signature(s) Signed: 03/01/2022 10:46:15 AM By: Deon Pilling RN, BSN Signed: 03/03/2022 1:40:10 PM By: Kalman Shan DO Entered By: Deon Pilling on 02/28/2022 13:23:08 -------------------------------------------------------------------------------- HPI Details Patient Name: Date of Service: [name] Cavalier RO N J. 02/28/2022 12:30 PM Medical Record Number: [id number] Patient Account Number: [id number] Date of Birth/Sex: Treating RN: 09/13/1955 (66 y.o. F) Primary Care Provider: Harlan Stains Other Clinician: Referring Provider: Treating Provider/Extender: Jake Church in Treatment: 4 History of Present Illness HPI Description: Admission 06/26/2021 [name] is a 66 year old female with a past medical history of uncontrolled type 2 diabetes on oral agents with last hemoglobin A1c of 9, peripheral arterial disease that presents to the clinic for a 3-week history of worsening wound to the posterior aspect of her left leg. She is not sure how it started. She has been using normal saline to the wound bed. She had arterial segmental pressures that showed a left ABI of 0.52 she is scheduled to see vein and vascular on 7/5. She is currently on doxycycline prescribed by her primary care doctor. She currently denies systemic signs of infection. 7/7; patient presents for follow-up. She saw Dr. Donzetta Matters yesterday, vein  and vascular to discuss her arterial status. He is scheduling her for an arteriogram for possible intervention. She has had no issues with the Medihoney. 7/24; patient presents for follow-up. She continues to use Medihoney. She is scheduled to have a stress test on 7/28 in order to be cleared for vascular surgery. Plan is for left common femoral endarterectomy and left common femoral to below the knee popliteal artery bypass. Readmission 01/31/2022 Patient had a left  iliofemoral endarterectomy with vein patch angioplasty and common femoral to above-the-knee popliteal bypass with PTFE by Dr. Donzetta Matters on 12/06/2021. She had follow-up with vein and vascular on 01/18/2022 and it was noted that her left foot is well-perfused with brisk DP and PT Doppler signals. She has follow-up again with vein and vascular at the end of this month for bypass duplex and ABIs. She is currently been using Medihoney to the wound bed. She reports improvement to her chronic pain. She currently denies signs of infection. 10/10; patient presents for follow-up. She has been she has been using Dakin's wet-to-dry dressings however she was having pain on dressing changes. We switched to [facility] in the week. She is done better with this. She has no issues or complaints today. 10/17; patient presents for follow-up. She has been using Santyl and Hydrofera Blue to the wound bed. There is been improvement in wound healing. She has no issues or complaints today. 10/31; patient presents for follow-up. She has been using Santyl and Hydrofera Blue to the wound bed. She has no issues or complaints today. She saw vein and vascular on 10/25 they repeated a TBI on the left which was 0.66. She had a bypass graft duplex that showed biphasic waveforms of throughout the left lower extremity Electronic Signature(s) Signed: 03/03/2022 1:40:10 PM By: Kalman Shan DO Entered By: Kalman Shan on 02/28/2022  13:50:13 -------------------------------------------------------------------------------- Physical Exam Details Patient Name: Date of Service: [name] Cavalier RO N J. 02/28/2022 12:30 PM Medical Record Number: [id number] Patient Account Number: [id number] Date of Birth/Sex: Treating RN: Apr 29, 1956 (66 y.o. F) Primary Care Provider: Harlan Stains Other Clinician: Referring Provider: Treating Provider/Extender: Jake Church in Treatment: [address], [place] ([id number]) [id number] Page 3 of 9 Constitutional respirations regular, non-labored and within target range for patient.. Cardiovascular 2+ dorsalis pedis/posterior tibialis pulses. Psychiatric pleasant and cooperative. Notes Left lower extremity: T the posterior aspect there is an open wound with nonviable tissue and granulation tissue. No surrounding signs of infection. o Electronic Signature(s) Signed: 03/03/2022 1:40:10 PM By: Kalman Shan DO Entered By: Kalman Shan on 02/28/2022 13:50:51 -------------------------------------------------------------------------------- Physician Orders Details Patient Name: Date of Service: [name] Cavalier RO N J. 02/28/2022 12:30 PM Medical Record Number: [id number] Patient Account Number: [id number] Date of Birth/Sex: Treating RN: 1955/10/22 (66 y.o. Helene Shoe, Meta.Reding Primary Care Provider: Harlan Stains Other Clinician: Referring Provider: Treating Provider/Extender: Jake Church in Treatment: 4 Verbal / Phone Orders: No Diagnosis Coding ICD-10 Coding Code Description I70.248 Atherosclerosis of native arteries of left leg with ulceration of other part of lower leg L97.828 Non-pressure chronic ulcer of other part of left lower leg with other specified severity E11.622 Type 2 diabetes mellitus with other skin ulcer Follow-up Appointments ppointment in 1 week. - Dr. Heber [place] 03/10/2022 1100am Return  A Anesthetic (In clinic) Topical Lidocaine 5% applied to wound bed Bathing/ Shower/ Hygiene May shower and wash wound with soap and water. - can use antibacterial soap Edema Control - Lymphedema / SCD / Other Elevate legs to the level of the heart or above for 30 minutes daily and/or when sitting, a frequency of: - throughout the day Avoid standing for long periods of time. Other Edema Control Orders/Instructions: - Tubigrip size E apply in the morning and remove at night. Additional Orders / Instructions Follow Nutritious Diet - Monitor/Control Blood Sugars Home Health No change in wound care orders this week; continue Home Health for wound care. May utilize formulary equivalent dressing for wound treatment orders  unless otherwise specified. - Home health to change twice a week. Dressing changes to be completed by [place] on Monday / Wednesday / Friday except when patient has scheduled visit at [facility]. Other Home Health Orders/Instructions: - [facility] home [facility] Wound Treatment Wound #2 - Lower Leg Wound Laterality: Left, Posterior Cleanser: Soap and Water (Home Health) 1 x Per Day/30 Days Discharge Instructions: May shower and wash wound with dial antibacterial soap and water prior to dressing change. Cleanser: Wound Cleanser (Home Health) 1 x Per Day/30 Days Discharge Instructions: Cleanse the wound with wound cleanser prior to applying a clean dressing using gauze sponges, not tissue or cotton balls. BRACHA, FRANKOWSKI ([id number]) [id number] Page 4 of 9 Cleanser: Dakin's Solution 0.5%,16 (oz) (Home Health) 1 x Per Day/30 Days Prim Dressing: Hydrofera Blue Ready Foam, 4x5 in (Home Health) 1 x Per Day/30 Days ary Discharge Instructions: Apply to wound bed as instructed Prim Dressing: Santyl Ointment ([place]) 1 x Per Day/30 Days ary Discharge Instructions: Apply nickel thick amount to wound bed apply directly to wound bed once you  purchase ointment. If too expensive continue to use hydrofera blue. Secondary Dressing: ABD Pad, 5x9 (Home Health) 1 x Per Day/30 Days Discharge Instructions: Apply over primary dressing as directed. Secondary Dressing: Woven Gauze Sponge, Non-Sterile 4x4 in (Home Health) 1 x Per Day/30 Days Discharge Instructions: Apply over primary dressing as directed. Secured With: [employer], 4.5x3.1 (in/yd) (Home Health) 1 x Per Day/30 Days Discharge Instructions: Secure with Kerlix as directed. Secured With: 57M Medipore H Soft Cloth Surgical T ape, 4 x 10 (in/yd) (Home Health) 1 x Per Day/30 Days Discharge Instructions: Secure with tape as directed. Compression Wrap: Tubigrip Size E 1 x Per Day/30 Days Discharge Instructions: apply in the morning and remove at night. Electronic Signature(s) Signed: 03/03/2022 1:40:10 PM By: Kalman Shan DO Entered By: Kalman Shan on 02/28/2022 13:50:58 -------------------------------------------------------------------------------- Problem List Details Patient Name: Date of Service: [name] Cavalier RO N J. 02/28/2022 12:30 PM Medical Record Number: [id number] Patient Account Number: [id number] Date of Birth/Sex: Treating RN: [birth year] (66 y.o. Helene Shoe, Meta.Reding Primary Care Provider: Harlan Stains Other Clinician: Referring Provider: Treating Provider/Extender: Jake Church in Treatment: 4 Active Problems ICD-10 Encounter Code Description Active Date MDM Diagnosis I70.248 Atherosclerosis of native arteries of left leg with ulceration of other part of 01/31/2022 No Yes lower leg L97.828 Non-pressure chronic ulcer of other part of left lower leg with other specified 01/31/2022 No Yes severity E11.622 Type 2 diabetes mellitus with other skin ulcer 01/31/2022 No Yes Inactive Problems Resolved Problems Electronic Signature(s) Signed: 03/03/2022 1:40:10 PM By: Kalman Shan DO Entered By: Kalman Shan on 02/28/2022  13:42:26 [name] ([id number]) [id number] Page 5 of 9 -------------------------------------------------------------------------------- Progress Note Details Patient Name: Date of Service: TERRIANNE, CAVNESS 02/28/2022 12:30 PM Medical Record Number: [id number] Patient Account Number: [id number] Date of Birth/Sex: Treating RN: July 08, 1955 (66 y.o. F) Primary Care Provider: Harlan Stains Other Clinician: Referring Provider: Treating Provider/Extender: Jake Church in Treatment: 4 Subjective Chief Complaint Information obtained from Patient 10/20/2021; Left posterior leg wound 01/31/2022; left posterior leg wound History of Present Illness (HPI) Admission 06/26/2021 Ms. Agatha Duplechain is a 66 year old female with a past medical history of uncontrolled type 2 diabetes on oral agents with last hemoglobin A1c of 9, peripheral arterial disease that presents to the clinic for a 3-week history of worsening wound to the posterior aspect of her left leg. She is  not sure how it started. She has been using normal saline to the wound bed. She had arterial segmental pressures that showed a left ABI of 0.52 she is scheduled to see vein and vascular on 7/5. She is currently on doxycycline prescribed by her primary care doctor. She currently denies systemic signs of infection. 7/7; patient presents for follow-up. She saw Dr. Donzetta Matters yesterday, vein and vascular to discuss her arterial status. He is scheduling her for an arteriogram for possible intervention. She has had no issues with the Medihoney. 7/24; patient presents for follow-up. She continues to use Medihoney. She is scheduled to have a stress test on 7/28 in order to be cleared for vascular surgery. Plan is for left common femoral endarterectomy and left common femoral to below the knee popliteal artery bypass. Readmission 01/31/2022 Patient had a left iliofemoral endarterectomy with vein  patch angioplasty and common femoral to above-the-knee popliteal bypass with PTFE by Dr. Donzetta Matters on 12/06/2021. She had follow-up with vein and vascular on 01/18/2022 and it was noted that her left foot is well-perfused with brisk DP and PT Doppler signals. She has follow-up again with vein and vascular at the end of this month for bypass duplex and ABIs. She is currently been using Medihoney to the wound bed. She reports improvement to her chronic pain. She currently denies signs of infection. 10/10; patient presents for follow-up. She has been she has been using Dakin's wet-to-dry dressings however she was having pain on dressing changes. We switched to [facility] in the week. She is done better with this. She has no issues or complaints today. 10/17; patient presents for follow-up. She has been using Santyl and Hydrofera Blue to the wound bed. There is been improvement in wound healing. She has no issues or complaints today. 10/31; patient presents for follow-up. She has been using Santyl and Hydrofera Blue to the wound bed. She has no issues or complaints today. She saw vein and vascular on 10/25 they repeated a TBI on the left which was 0.66. She had a bypass graft duplex that showed biphasic waveforms of throughout the left lower extremity Patient History Information obtained from Patient, Caregiver. Family History Cancer - Father, Diabetes - Mother,Maternal Grandparents,Father, Hypertension - Mother, No family history of Heart Disease, Hereditary Spherocytosis, Kidney Disease, Lung Disease, Seizures, Stroke, Thyroid Problems, Tuberculosis. Social History Former smoker, Marital Status - Married, Alcohol Use - Rarely, Drug Use - No History, Caffeine Use - Daily. Medical History Eyes Denies history of Cataracts, Glaucoma, Optic Neuritis Cardiovascular Patient has history of Hypertension, Peripheral Arterial Disease, Peripheral Venous Disease Denies history of Angina, Arrhythmia, Coronary  Artery Disease, Deep Vein Thrombosis, Hypotension, Myocardial Infarction, Phlebitis, Vasculitis Gastrointestinal Denies history of Cirrhosis , Colitis, Crohnoos, Hepatitis A, Hepatitis B, Hepatitis C Endocrine Patient has history of Type II Diabetes - A1C-9.1 Denies history of Type I Diabetes Immunological Denies history of Lupus Erythematosus, Raynaudoos, Scleroderma Musculoskeletal Patient has history of Osteoarthritis, Osteomyelitis - Hx ankle Denies history of Gout, Rheumatoid Arthritis Neurologic Denies history of Neuropathy, Quadriplegia, Paraplegia, Seizure Disorder Hospitalization/Surgery History - left knee. Medical A Surgical History Notes nd Genitourinary ZAIDY, ABSHER ([id number]) [id number] Page 6 of 9 CKD-3 Objective Constitutional respirations regular, non-labored and within target range for patient.. Vitals Time Taken: 12:49 PM, Height: 64 in, Weight: 197 lbs, BMI: 33.8, Temperature: 98.2 F, Pulse: 66 bpm, Respiratory Rate: 18 breaths/min, Blood Pressure: 137/70 mmHg. Cardiovascular 2+ dorsalis pedis/posterior tibialis pulses. Psychiatric pleasant and cooperative. General Notes: Left lower extremity: T the  posterior aspect there is an open wound with nonviable tissue and granulation tissue. No surrounding signs of o infection. Integumentary (Hair, Skin) Wound #2 status is Open. Original cause of wound was Gradually Appeared. The date acquired was: 11/21/2021. The wound has been in treatment 4 weeks. The wound is located on the Left,Posterior Lower Leg. The wound measures 6.8cm length x 5.3cm width x 0.2cm depth; 28.306cm^2 area and 5.661cm^3 volume. There is Fat Layer (Subcutaneous Tissue) exposed. There is no tunneling or undermining noted. There is a medium amount of serosanguineous drainage noted. The wound margin is distinct with the outline attached to the wound base. There is large (67-100%) red, pink, hyper - granulation  within the wound bed. There is a small (1-33%) amount of necrotic tissue within the wound bed including Eschar and Adherent Slough. The periwound skin appearance did not exhibit: Callus, Crepitus, Excoriation, Induration, Rash, Scarring, Dry/Scaly, Maceration, Atrophie Blanche, Cyanosis, Ecchymosis, Hemosiderin Staining, Mottled, Pallor, Rubor, Erythema. Periwound temperature was noted as No Abnormality. The periwound has tenderness on palpation. Assessment Active Problems ICD-10 Atherosclerosis of native arteries of left leg with ulceration of other part of lower leg Non-pressure chronic ulcer of other part of left lower leg with other specified severity Type 2 diabetes mellitus with other skin ulcer Patient's wound has shown improvement in size in appearance since last clinic visit. I debrided nonviable tissue. I recommended continuing with Santyl and Hydrofera Blue. I recommended Tubigrip as well to help with further swelling. She saw [facility] on 10/25 and She should have adequate blood flow at this time for wound healing. Procedures Wound #2 Pre-procedure diagnosis of Wound #2 is an Arterial Insufficiency Ulcer located on the Left,Posterior Lower Leg .Severity of Tissue Pre Debridement is: Fat layer exposed. There was a Excisional Skin/Subcutaneous Tissue Debridement with a total area of 36.04 sq cm performed by Kalman Shan, DO. With the following instrument(s): Curette to remove Viable and Non-Viable tissue/material. Material removed includes Eschar, Subcutaneous Tissue, Slough, Skin: Dermis, and Skin: Epidermis after achieving pain control using Lidocaine 5% topical ointment. A time out was conducted at 13:15, prior to the start of the procedure. A Minimum amount of bleeding was controlled with Pressure. The procedure was tolerated well with a pain level of 0 throughout and a pain level of 0 following the procedure. Post Debridement Measurements: 6.8cm length x 5.3cm width x 0.2cm depth;  5.661cm^3 volume. Character of Wound/Ulcer Post Debridement is improved. Severity of Tissue Post Debridement is: Fat layer exposed. Post procedure Diagnosis Wound #2: Same as Pre-Procedure Plan Follow-up Appointments: Return Appointment in 1 week. - Dr. Heber [place] 03/10/2022 1100am Anesthetic: (In clinic) Topical Lidocaine 5% applied to wound bed Bathing/ Shower/ Hygiene: May shower and wash wound with soap and water. - can use antibacterial soap RIMSHA, TREMBLEY ([id number]) [id number] Page 7 of 9 Edema Control - Lymphedema / SCD / Other: Elevate legs to the level of the heart or above for 30 minutes daily and/or when sitting, a frequency of: - throughout the day Avoid standing for long periods of time. Other Edema Control Orders/Instructions: - Tubigrip size E apply in the morning and remove at night. Additional Orders / Instructions: Follow Nutritious Diet - Monitor/Control Blood Sugars Home Health: No change in wound care orders this week; continue Home Health for wound care. May utilize formulary equivalent dressing for wound treatment orders unless otherwise specified. - Home health to change twice a week. Dressing changes to be completed by Home Health on Monday / Wednesday / Friday except  when patient has scheduled [facility]. Other Home Health Orders/Instructions: - [facility] home [facility] WOUND #2: - Lower Leg Wound Laterality: Left, Posterior Cleanser: Soap and Water (Home Health) 1 x Per Day/30 Days Discharge Instructions: May shower and wash wound with dial antibacterial soap and water prior to dressing change. Cleanser: Wound Cleanser (Home Health) 1 x Per Day/30 Days Discharge Instructions: Cleanse the wound with wound cleanser prior to applying a clean dressing using gauze sponges, not tissue or cotton balls. Cleanser: Dakin's Solution 0.5%,16 (oz) (Home Health) 1 x Per Day/30 Days Prim Dressing: Hydrofera Blue Ready Foam, 4x5 in  (Home Health) 1 x Per Day/30 Days ary Discharge Instructions: Apply to wound bed as instructed Prim Dressing: Santyl Ointment ([place]) 1 x Per Day/30 Days ary Discharge Instructions: Apply nickel thick amount to wound bed apply directly to wound bed once you purchase ointment. If too expensive continue to use hydrofera blue. Secondary Dressing: ABD Pad, 5x9 (Home Health) 1 x Per Day/30 Days Discharge Instructions: Apply over primary dressing as directed. Secondary Dressing: Woven Gauze Sponge, Non-Sterile 4x4 in (Home Health) 1 x Per Day/30 Days Discharge Instructions: Apply over primary dressing as directed. Secured With: [employer], 4.5x3.1 (in/yd) (Home Health) 1 x Per Day/30 Days Discharge Instructions: Secure with Kerlix as directed. Secured With: 23M Medipore H Soft Cloth Surgical T ape, 4 x 10 (in/yd) (Home Health) 1 x Per Day/30 Days Discharge Instructions: Secure with tape as directed. Com pression Wrap: Tubigrip Size E 1 x Per Day/30 Days Discharge Instructions: apply in the morning and remove at night. 1. In office sharp debridement 2. Hydrofera Blue and Santyl 3. Tubigrip 4. Follow-up in 1 week Electronic Signature(s) Signed: 03/03/2022 1:40:10 PM By: Kalman Shan DO Entered By: Kalman Shan on 02/28/2022 13:59:11 -------------------------------------------------------------------------------- HxROS Details Patient Name: Date of Service: [name] Cavalier RO N J. 02/28/2022 12:30 PM Medical Record Number: [id number] Patient Account Number: [id number] Date of Birth/Sex: Treating RN: [birth year] (66 y.o. F) Primary Care Provider: Harlan Stains Other Clinician: Referring Provider: Treating Provider/Extender: Jake Church in Treatment: 4 Information Obtained From Patient Caregiver Eyes Medical History: Negative for: Cataracts; Glaucoma; Optic Neuritis Cardiovascular Medical History: Positive for: Hypertension; Peripheral  Arterial Disease; Peripheral Venous Disease Negative for: Angina; Arrhythmia; Coronary Artery Disease; Deep Vein Thrombosis; Hypotension; Myocardial Infarction; Phlebitis; Vasculitis Gastrointestinal Medical History: Negative for: Cirrhosis ; Colitis; Crohns; Hepatitis A; Hepatitis B; Hepatitis JOSHUA, ZERINGUE ([id number]) [id number] Page 8 of 9 Endocrine Medical History: Positive for: Type II Diabetes - A1C-9.1 Negative for: Type I Diabetes Time with diabetes: 12 years Treated with: Oral agents Blood sugar tested every day: No Genitourinary Medical History: Past Medical History Notes: CKD-3 Immunological Medical History: Negative for: Lupus Erythematosus; Raynauds; Scleroderma Musculoskeletal Medical History: Positive for: Osteoarthritis; Osteomyelitis - Hx ankle Negative for: Gout; Rheumatoid Arthritis Neurologic Medical History: Negative for: Neuropathy; Quadriplegia; Paraplegia; Seizure Disorder Immunizations Pneumococcal Vaccine: Received Pneumococcal Vaccination: No Implantable Devices None Hospitalization / Surgery History Type of Hospitalization/Surgery left knee Family and Social History Cancer: Yes - Father; Diabetes: Yes - Mother,Maternal Grandparents,Father; Heart Disease: No; Hereditary Spherocytosis: No; Hypertension: Yes - Mother; Kidney Disease: No; Lung Disease: No; Seizures: No; Stroke: No; Thyroid Problems: No; Tuberculosis: No; Former smoker; Marital Status - Married; Alcohol Use: Rarely; Drug Use: No History; Caffeine Use: Daily; Financial Concerns: No; Food, Clothing or Shelter Needs: No; Support System Lacking: No; Transportation Concerns: No Electronic Signature(s) Signed: 03/03/2022 1:40:10 PM By: Kalman Shan DO Entered By: Heber Sheridan,  Chamaine Stankus on 02/28/2022 13:50:19 -------------------------------------------------------------------------------- SuperBill Details Patient Name: Date of Service: MARIELOUISE, AMEY 02/28/2022 Medical Record Number: [id number] Patient Account Number: [id number] Date of Birth/Sex: Treating RN: 1955/11/15 (66 y.o. 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 PAULENA, SERVAIS ([id number]) [id number] Page 9 of 9 [phone] Non-pressure chronic ulcer of other part of left lower leg with other specified severity E11.622 Type 2 diabetes mellitus with other skin ulcer Facility Procedures : CPT4 Code: 56812751 Description: 70017 - DEB SUBQ TISSUE 20 SQ CM/< ICD-10 Diagnosis Description L97.828 Non-pressure chronic ulcer of other part of left lower leg with other specified Modifier: severity Quantity: 1 : CPT4 Code: 49449675 Description: 91638 - DEB SUBQ TISS EA ADDL 20CM ICD-10 Diagnosis Description L97.828 Non-pressure chronic ulcer of other part of left lower leg with other specified Modifier: severity Quantity: 1 Physician Procedures : CPT4 Code Description Modifier [id number] 11042 - WC PHYS SUBQ TISS 20 SQ CM ICD-10 Diagnosis Description L97.828 Non-pressure chronic ulcer of other part of left lower leg with other specified severity Quantity: 1 : [id number] 39030 - WC PHYS SUBQ TISS EA ADDL 20 CM ICD-10 Diagnosis Description L97.828 Non-pressure chronic ulcer of other part of left lower leg with other specified severity Quantity: 1 Electronic Signature(s) Signed: 03/03/2022 1:40:10 PM By: Kalman Shan DO Entered By: Kalman Shan on 02/28/2022 14:00:14

## 2022-03-03 NOTE — Progress Notes (Signed)
[name], Houston ([id number]) [id number] Page 1 of 7 Visit Report for 02/28/2022 Arrival Information Details Patient Name: Date of Service: [name] [place], [name] Houston 02/28/2022 12:30 PM Medical Record Number: [id number] Patient Account Number: [id number] Date of Birth/Sex: Treating RN: [birth year] (66 y.o. F) Primary Care Eain Mullendore: Harlan Stains Other Clinician: Referring Sanna Porcaro: Treating Elohim Brune/Extender: Jake Church in Treatment: 4 Visit Information History Since Last Visit Added or deleted any medications: No Patient Arrived: Walker Pain Present Now: Yes Arrival Time: 12:46 Accompanied By: Husband Transfer Assistance: None Patient Identification Verified: Yes Secondary Verification Process Completed: Yes Patient Requires Transmission-Based Precautions: No Patient Has Alerts: No Electronic Signature(s) Signed: 03/02/2022 11:40:36 AM By: Erenest Blank Entered By: Erenest Blank on 02/28/2022 12:47:56 -------------------------------------------------------------------------------- Encounter Discharge Information Details Patient Name: Date of Service: [name] Cavalier RO N J. 02/28/2022 12:30 PM Medical Record Number: [id number] Patient Account Number: [id number] Date of Birth/Sex: Treating RN: 02-16-1956 (66 y.o. [name] Primary Care Wilgus Deyton: Harlan Stains Other Clinician: Referring Kele Withem: Treating Levon Boettcher/Extender: Jake Church in Treatment: 4 Encounter Discharge Information Items Post Procedure Vitals Discharge Condition: Stable Temperature (F): 98.2 Ambulatory Status: Walker Pulse (bpm): 66 Discharge Destination: Home Respiratory Rate (breaths/min): 18 Transportation: Private Auto Blood Pressure (mmHg): 137/70 Accompanied By: family Schedule Follow-up Appointment: Yes Clinical Summary of Care: Electronic Signature(s) Signed: 03/01/2022 10:46:15 AM By: Deon Pilling RN,  BSN Entered By: Deon Pilling on 02/28/2022 13:50:18 Lower Extremity Assessment Details -------------------------------------------------------------------------------- [name] ([id number]) [id number] Page 2 of 7 Patient Name: Date of Service: [name] [place], [name] 02/28/2022 12:30 PM Medical Record Number: [id number] Patient Account Number: [id number] Date of Birth/Sex: Treating RN: [birth year] (66 y.o. F) Primary Care Seabron Iannello: Harlan Stains Other Clinician: Referring Brynlee Pennywell: Treating Amran Malter/Extender: [name] Weeks in Treatment: 4 Edema Assessment Left: Right: Assessed: No No Edema: Yes Calf Left: Right: Point of Measurement: 29 cm From Medial Instep 42.3 cm Ankle Left: Right: Point of Measurement: 7 cm From Medial Instep 26 cm Electronic Signature(s) Signed: 03/02/2022 11:40:36 AM By: Erenest Blank Entered By: Erenest Blank on 02/28/2022 12:59:57 -------------------------------------------------------------------------------- Multi Wound Chart Details Patient Name: Date of Service: [name] Cavalier RO N J. 02/28/2022 12:30 PM Medical Record Number: [id number] Patient Account Number: [id number] Date of Birth/Sex: Treating RN: 10-12-1955 (66 y.o. F) Primary Care Shyia Fillingim: Harlan Stains Other Clinician: Referring Jameriah Trotti: Treating Ekansh Sherk/Extender: [name] Weeks in Treatment: 4 Vital Signs Height(in): 64 Pulse(bpm): 79 Weight(lbs): 197 Blood Pressure(mmHg): 137/70 Body Mass Index(BMI): 33.8 Temperature(F): 98.2 Respiratory Rate(breaths/min): 18 [2:Photos:] [N/A:N/A] Left, Posterior Lower Leg N/A N/A Wound Location: Gradually Appeared N/A N/A Wounding Event: Arterial Insufficiency Ulcer N/A N/A Primary Etiology: Hypertension, Peripheral Arterial N/A N/A Comorbid History: Disease, Peripheral Venous Disease, Type II Diabetes, Osteoarthritis, Osteomyelitis 11/21/2021 N/A  N/A Date Acquired: 4 N/A N/A Weeks of Treatment: Open N/A N/A Wound Status: No N/A N/A Wound Recurrence: 6.8x5.3x0.2 N/A N/A Measurements L x W x D (cm) 28.306 N/A N/A A (cm) : rea 5.661 N/A N/A Volume (cm) : 19.90% N/A N/A % Reduction in Area: LOREAN, EKSTRAND ([id number]) [id number] Page 3 of 7 19.90% N/A N/A % Reduction in Volume: Full Thickness Without Exposed N/A N/A Classification: Support Structures Medium N/A N/A Exudate A mount: Serosanguineous N/A N/A Exudate Type: red, brown N/A N/A Exudate Color: Distinct, outline attached N/A N/A Wound Margin: Large (67-100%) N/A N/A Granulation A mount: Red, Pink, Hyper-granulation N/A N/A Granulation Quality: Small (1-33%) N/A N/A Necrotic A mount: Eschar, Adherent Slough N/A N/A  Necrotic Tissue: Fat Layer (Subcutaneous Tissue): Yes N/A N/A Exposed Structures: Fascia: No Tendon: No Muscle: No Joint: No Bone: No Small (1-33%) N/A N/A Epithelialization: Debridement - Excisional N/A N/A Debridement: Pre-procedure Verification/Time Out 13:15 N/A N/A Taken: Lidocaine 5% topical ointment N/A N/A Pain Control: Necrotic/Eschar, Subcutaneous, N/A N/A Tissue Debrided: Slough Skin/Subcutaneous Tissue N/A N/A Level: 36.04 N/A N/A Debridement A (sq cm): rea Curette N/A N/A Instrument: Minimum N/A N/A Bleeding: Pressure N/A N/A Hemostasis Achieved: 0 N/A N/A Procedural Pain: 0 N/A N/A Post Procedural Pain: Debridement Treatment Response: Procedure was tolerated well N/A N/A Post Debridement Measurements L x 6.8x5.3x0.2 N/A N/A W x D (cm) 5.661 N/A N/A Post Debridement Volume: (cm) Excoriation: No N/A N/A Periwound Skin Texture: Induration: No Callus: No Crepitus: No Rash: No Scarring: No Maceration: No N/A N/A Periwound Skin Moisture: Dry/Scaly: No Atrophie Blanche: No N/A N/A Periwound Skin Color: Cyanosis: No Ecchymosis: No Erythema: No Hemosiderin Staining:  No Mottled: No Pallor: No Rubor: No No Abnormality N/A N/A Temperature: Yes N/A N/A Tenderness on Palpation: Debridement N/A N/A Procedures Performed: Treatment Notes Electronic Signature(s) Signed: 03/03/2022 1:40:10 PM By: Kalman Shan DO Entered By: Kalman Shan on 02/28/2022 13:42:32 -------------------------------------------------------------------------------- Multi-Disciplinary Care Plan Details Patient Name: Date of Service: [name] Cavalier RO N J. 02/28/2022 12:30 PM Medical Record Number: [id number] Patient Account Number: [id number] Date of Birth/Sex: Treating RN: 1955/06/02 (66 y.o. Helene Shoe, Tammi Klippel Primary Care Alwaleed Obeso: Harlan Stains Other Clinician: Referring Edel Rivero: Treating Sapphira Harjo/Extender: Jake Church in Treatment: [address] HENRYETTA, CORRIVEAU ([id number]) [id number] Page 4 of 7 Wound/Skin Impairment Nursing Diagnoses: Impaired tissue integrity Knowledge deficit related to ulceration/compromised skin integrity Goals: Patient will have a decrease in wound volume by X% from date: (specify in notes) Date Initiated: 01/31/2022 Target Resolution Date: 04/21/2022 Goal Status: Active Patient/caregiver will verbalize understanding of skin care regimen Date Initiated: 01/31/2022 Target Resolution Date: 04/27/2022 Goal Status: Active Ulcer/skin breakdown will have a volume reduction of 30% by week 4 Date Initiated: 01/31/2022 Date Inactivated: 02/28/2022 Target Resolution Date: 03/03/2022 Unmet Reason: 19.9% healed in area Goal Status: Unmet and volume. Continue wound care treatment. Interventions: Assess patient/caregiver ability to obtain necessary supplies Assess patient/caregiver ability to perform ulcer/skin care regimen upon admission and as needed Assess ulceration(s) every visit Notes: Electronic Signature(s) Signed: 03/01/2022 10:46:15 AM By: Deon Pilling RN, BSN Entered By:  Deon Pilling on 02/28/2022 13:21:36 -------------------------------------------------------------------------------- Pain Assessment Details Patient Name: Date of Service: [name] Cavalier RO N J. 02/28/2022 12:30 PM Medical Record Number: [id number] Patient Account Number: [id number] Date of Birth/Sex: Treating RN: 1955-08-27 (66 y.o. F) Primary Care Media Pizzini: Harlan Stains Other Clinician: Referring Akiba Melfi: Treating Daemien Fronczak/Extender: Jake Church in Treatment: 4 Active Problems Location of Pain Severity and Description of Pain Patient Has Paino Yes Site Locations Pain Location: Pain in Ulcers Rate the pain. Current Pain Level: 5 Pain Management and Medication Current Pain Management: JESSYCA, SLOAN ([id number]) [id number] Page 5 of 7 Electronic Signature(s) Signed: 03/02/2022 11:40:36 AM By: Erenest Blank Entered By: Erenest Blank on 02/28/2022 12:48:34 -------------------------------------------------------------------------------- Patient/Caregiver Education Details Patient Name: Date of Service: [name] 10/31/2023andnbsp12:30 PM Medical Record Number: [id number] Patient Account Number: [id number] Date of Birth/Gender: Treating RN: September 30, 1955 (66 y.o. [name] Primary Care Physician: Harlan Stains Other Clinician: Referring Physician: Treating Physician/Extender: Jake Church in Treatment: 4 Education Assessment Education Provided To: Patient Education Topics Provided Wound/Skin Impairment: Handouts: Skin Care Do's and Dont's Methods: Explain/Verbal Responses: Reinforcements needed  Electronic Signature(s) Signed: 03/01/2022 10:46:15 AM By: Deon Pilling RN, BSN Entered By: Deon Pilling on 02/28/2022 13:21:51 -------------------------------------------------------------------------------- Wound Assessment Details Patient Name: Date of Service: [name] Cavalier RO  N J. 02/28/2022 12:30 PM Medical Record Number: [id number] Patient Account Number: [id number] Date of Birth/Sex: Treating RN: 08/08/1955 (66 y.o. F) Primary Care Eliot Bencivenga: Harlan Stains Other Clinician: Referring Kailen Name: Treating Addalie Calles/Extender: [name] Weeks in Treatment: 4 Wound Status Wound Number: 2 Primary Arterial Insufficiency Ulcer Etiology: Wound Location: Left, Posterior Lower Leg Wound Open Wounding Event: Gradually Appeared Status: Date Acquired: 11/21/2021 Comorbid Hypertension, Peripheral Arterial Disease, Peripheral Venous Weeks Of Treatment: 4 History: Disease, Type II Diabetes, Osteoarthritis, Osteomyelitis Clustered Wound: No Photos YOONA, ISHII ([id number]) [id number] Page 6 of 7 Wound Measurements Length: (cm) 6.8 Width: (cm) 5.3 Depth: (cm) 0.2 Area: (cm) 28.306 Volume: (cm) 5.661 % Reduction in Area: 19.9% % Reduction in Volume: 19.9% Epithelialization: Small (1-33%) Tunneling: No Undermining: No Wound Description Classification: Full Thickness Without Exposed Support Structures Wound Margin: Distinct, outline attached Exudate Amount: Medium Exudate Type: Serosanguineous Exudate Color: red, brown Foul Odor After Cleansing: No Slough/Fibrino Yes Wound Bed Granulation Amount: Large (67-100%) Exposed Structure Granulation Quality: Red, Pink, Hyper-granulation Fascia Exposed: No Necrotic Amount: Small (1-33%) Fat Layer (Subcutaneous Tissue) Exposed: Yes Necrotic Quality: Eschar, Adherent Slough Tendon Exposed: No Muscle Exposed: No Joint Exposed: No Bone Exposed: No Periwound Skin Texture Texture Color No Abnormalities Noted: No No Abnormalities Noted: No Callus: No Atrophie Blanche: No Crepitus: No Cyanosis: No Excoriation: No Ecchymosis: No Induration: No Erythema: No Rash: No Hemosiderin Staining: No Scarring: No Mottled: No Pallor: No Moisture Rubor: No No  Abnormalities Noted: No Dry / Scaly: No Temperature / Pain Maceration: No Temperature: No Abnormality Tenderness on Palpation: Yes Treatment Notes Wound #2 (Lower Leg) Wound Laterality: Left, Posterior Cleanser Soap and Water Discharge Instruction: May shower and wash wound with dial antibacterial soap and water prior to dressing change. Wound Cleanser Discharge Instruction: Cleanse the wound with wound cleanser prior to applying a clean dressing using gauze sponges, not tissue or cotton balls. Dakin's Solution 0.5%,16 (oz) Peri-Wound Care Topical Primary Dressing Hydrofera Blue Ready Foam, 4x5 in Discharge Instruction: Apply to wound bed as instructed Santyl Ointment Discharge Instruction: Apply nickel thick amount to wound bed apply directly to wound bed once you purchase ointment. If too expensive continue to use hydrofera blue. Secondary Dressing ESTIE, SPROULE ([id number]) [id number] Page 7 of 7 ABD Pad, 5x9 Discharge Instruction: Apply over primary dressing as directed. Woven Gauze Sponge, Non-Sterile 4x4 in Discharge Instruction: Apply over primary dressing as directed. Secured With [employer], 4.5x3.1 (in/yd) Discharge Instruction: Secure with Kerlix as directed. 71M Medipore H Soft Cloth Surgical T ape, 4 x 10 (in/yd) Discharge Instruction: Secure with tape as directed. Compression Wrap Tubigrip Size E Discharge Instruction: apply in the morning and remove at night. Compression Stockings Add-Ons Electronic Signature(s) Signed: 03/02/2022 11:40:36 AM By: Erenest Blank Entered By: Erenest Blank on 02/28/2022 13:02:11 -------------------------------------------------------------------------------- Vitals Details Patient Name: Date of Service: [name] Cavalier RO N J. 02/28/2022 12:30 PM Medical Record Number: [id number] Patient Account Number: [id number] Date of Birth/Sex: Treating RN: [birth year] (66 y.o. F) Primary Care Roniqua Kintz:  Harlan Stains Other Clinician: Referring Karianne Nogueira: Treating Annalei Friesz/Extender: Jake Church in Treatment: 4 Vital Signs Time Taken: 12:49 Temperature (F): 98.2 Height (in): 64 Pulse (bpm): 66 Weight (lbs): 197 Respiratory Rate (breaths/min): 18 Body Mass Index (BMI): 33.8 Blood Pressure (mmHg): 137/70 Reference Range: 80 - 120 mg /  dl Electronic Signature(s) Signed: 03/02/2022 11:40:36 AM By: Erenest Blank Entered By: Erenest Blank on 02/28/2022 12:53:35

## 2022-03-10 ENCOUNTER — Encounter (HOSPITAL_BASED_OUTPATIENT_CLINIC_OR_DEPARTMENT_OTHER): Payer: HMO | Attending: Internal Medicine | Admitting: Internal Medicine

## 2022-03-10 DIAGNOSIS — I70248 Atherosclerosis of native arteries of left leg with ulceration of other part of lower left leg: Secondary | ICD-10-CM | POA: Insufficient documentation

## 2022-03-10 DIAGNOSIS — E11622 Type 2 diabetes mellitus with other skin ulcer: Secondary | ICD-10-CM

## 2022-03-10 DIAGNOSIS — L97828 Non-pressure chronic ulcer of other part of left lower leg with other specified severity: Secondary | ICD-10-CM | POA: Diagnosis not present

## 2022-03-10 DIAGNOSIS — E1151 Type 2 diabetes mellitus with diabetic peripheral angiopathy without gangrene: Secondary | ICD-10-CM | POA: Diagnosis not present

## 2022-03-10 NOTE — Progress Notes (Signed)
[name], Houston ([id number]) [id number] Page 1 of 8 Visit Report for 03/10/2022 Arrival Information Details Patient Name: Date of Service: [name] Houston, [name] Houston 03/10/2022 11:00 A M Medical Record Number: [id number] Patient Account Number: [id number] Date of Birth/Sex: Treating RN: [birth year] (66 y.o. Helene Shoe, Tammi Klippel Primary Care Encarnacion Bole: Harlan Stains Other Clinician: Referring Ingrid Shifrin: Treating Lilymae Swiech/Extender: Jake Church in Treatment: 5 Visit Information History Since Last Visit Added or deleted any medications: No Patient Arrived: [name] Any new allergies or adverse reactions: No Arrival Time: 11:27 Had a fall or experienced change in No Accompanied By: family member activities of daily living that may affect Transfer Assistance: None risk of falls: Patient Identification Verified: Yes Signs or symptoms of abuse/neglect since last visito No Secondary Verification Process Completed: Yes Hospitalized since last visit: No Patient Requires Transmission-Based Precautions: No Implantable device outside of the clinic excluding No Patient Has Alerts: No cellular tissue based products placed in the center since last visit: Has Dressing in Place as Prescribed: Yes Pain Present Now: Yes Electronic Signature(s) Signed: 03/10/2022 2:17:30 PM By: Deon Pilling RN, BSN Entered By: Deon Pilling on 03/10/2022 11:28:00 -------------------------------------------------------------------------------- Encounter Discharge Information Details Patient Name: Date of Service: [name] Houston RO N J. 03/10/2022 11:00 A M Medical Record Number: [id number] Patient Account Number: [id number] Date of Birth/Sex: Treating RN: Oct 12, 1955 (66 y.o. [name] Primary Care Terrill Alperin: Harlan Stains Other Clinician: Referring Dorrien Grunder: Treating Saretta Dahlem/Extender: Jake Church in Treatment: 5 Encounter Discharge  Information Items Post Procedure Vitals Discharge Condition: Stable Temperature (F): 98.2 Ambulatory Status: Walker Pulse (bpm): 74 Discharge Destination: Home Respiratory Rate (breaths/min): 20 Transportation: Private Auto Blood Pressure (mmHg): 163/80 Accompanied By: husband Schedule Follow-up Appointment: Yes Clinical Summary of Care: Electronic Signature(s) Signed: 03/10/2022 2:17:30 PM By: Deon Pilling RN, BSN Entered By: Deon Pilling on 03/10/2022 11:58:35 [name] ([id number]) [id number] Page 2 of 8 -------------------------------------------------------------------------------- Lower Extremity Assessment Details Patient Name: Date of Service: [name], Houston 03/10/2022 11:00 A M Medical Record Number: [id number] Patient Account Number: [id number] Date of Birth/Sex: Treating RN: 08/09/1955 (66 y.o. [name] Primary Care Khrystina Bonnes: Harlan Stains Other Clinician: Referring Britlee Skolnik: Treating Surabhi Gadea/Extender: [name] Weeks in Treatment: 5 Edema Assessment Assessed: [Left: Yes] [Right: No] Edema: [Left: Ye] [Right: s] Calf Left: Right: Point of Measurement: 29 cm From Medial Instep 39 cm Ankle Left: Right: Point of Measurement: 7 cm From Medial Instep 23.5 cm Vascular Assessment Pulses: Dorsalis Pedis Palpable: [Left:Yes] Electronic Signature(s) Signed: 03/10/2022 2:17:30 PM By: Deon Pilling RN, BSN Entered By: Deon Pilling on 03/10/2022 11:31:25 -------------------------------------------------------------------------------- Multi Wound Chart Details Patient Name: Date of Service: [name] Houston RO N J. 03/10/2022 11:00 A M Medical Record Number: [id number] Patient Account Number: [id number] Date of Birth/Sex: Treating RN: 1955-08-26 (66 y.o. F) Primary Care Naomii Kreger: Harlan Stains Other Clinician: Referring Riaan Toledo: Treating Sameria Morss/Extender: [name] Weeks  in Treatment: 5 Vital Signs Height(in): 77 Capillary Blood Glucose(mg/dl): 90 Weight(lbs): 197 Pulse(bpm): 36 Body Mass Index(BMI): 33.8 Blood Pressure(mmHg): 163/80 Temperature(F): 98.2 Respiratory Rate(breaths/min): 16 [2:Photos:] [N/A:N/A] Left, Posterior Lower Leg N/A N/A Wound Location: Gradually Appeared N/A N/A Wounding Event: Arterial Insufficiency Ulcer N/A N/A Primary Etiology: Hypertension, Peripheral Arterial N/A N/A Comorbid History: Disease, Peripheral Venous Disease, Type II Diabetes, Osteoarthritis, Osteomyelitis 11/21/2021 N/A N/A Date Acquired: 5 N/A N/A Weeks of Treatment: Open N/A N/A Wound Status: No N/A N/A Wound Recurrence: 6.6x5x0.1 N/A N/A Measurements L x W x D (cm) 25.918 N/A N/A  A (cm) : rea 2.592 N/A N/A Volume (cm) : 26.70% N/A N/A % Reduction in A rea: 63.30% N/A N/A % Reduction in Volume: Full Thickness Without Exposed N/A N/A Classification: Support Structures Medium N/A N/A Exudate A mount: Serosanguineous N/A N/A Exudate Type: red, brown N/A N/A Exudate Color: Distinct, outline attached N/A N/A Wound Margin: Large (67-100%) N/A N/A Granulation A mount: Red, Pink, Hyper-granulation N/A N/A Granulation Quality: Small (1-33%) N/A N/A Necrotic A mount: Fat Layer (Subcutaneous Tissue): Yes N/A N/A Exposed Structures: Fascia: No Tendon: No Muscle: No Joint: No Bone: No Small (1-33%) N/A N/A Epithelialization: Debridement - Excisional N/A N/A Debridement: Pre-procedure Verification/Time Out 11:53 N/A N/A Taken: Lidocaine 5% topical ointment N/A N/A Pain Control: Subcutaneous, Slough N/A N/A Tissue Debrided: Skin/Subcutaneous Tissue N/A N/A Level: 33 N/A N/A Debridement A (sq cm): rea Curette N/A N/A Instrument: Minimum N/A N/A Bleeding: Pressure N/A N/A Hemostasis A chieved: 0 N/A N/A Procedural Pain: 0 N/A N/A Post Procedural Pain: Procedure was tolerated well N/A N/A Debridement Treatment  Response: 6.6x5x0.1 N/A N/A Post Debridement Measurements L x W x D (cm) 2.592 N/A N/A Post Debridement Volume: (cm) Excoriation: No N/A N/A Periwound Skin Texture: Induration: No Callus: No Crepitus: No Rash: No Scarring: No Maceration: No N/A N/A Periwound Skin Moisture: Dry/Scaly: No Atrophie Blanche: No N/A N/A Periwound Skin Color: Cyanosis: No Ecchymosis: No Erythema: No Hemosiderin Staining: No Mottled: No Pallor: No Rubor: No No Abnormality N/A N/A Temperature: Yes N/A N/A Tenderness on Palpation: Debridement N/A N/A Procedures Performed: Treatment Notes Wound #2 (Lower Leg) Wound Laterality: Left, Posterior Cleanser Soap and Water Discharge Instruction: May shower and wash wound with dial antibacterial soap and water prior to dressing change. Wound Cleanser Discharge Instruction: Cleanse the wound with wound cleanser prior to applying a clean dressing using gauze sponges, not tissue or cotton balls. Dakin's Solution 0.5%,16 (oz) Peri-Wound Care Topical Primary Dressing DANAYE, SOBH ([id number]) [id number] Page 4 of 8 Hydrofera Blue Ready Foam, 4x5 in Discharge Instruction: Apply to wound bed as instructed MediHoney Gel, tube 1.5 (oz) Discharge Instruction: Apply to wound bed under the hydrofera blue. Secondary Dressing ABD Pad, 5x9 Discharge Instruction: Apply over primary dressing as directed. Woven Gauze Sponge, Non-Sterile 4x4 in Discharge Instruction: Apply over primary dressing as directed. Secured With [employer], 4.5x3.1 (in/yd) Discharge Instruction: Secure with Kerlix as directed. 46M Medipore H Soft Cloth Surgical T ape, 4 x 10 (in/yd) Discharge Instruction: Secure with tape as directed. Compression Wrap Tubigrip Size E Discharge Instruction: apply in the morning and remove at night. Compression Stockings Add-Ons Electronic Signature(s) Signed: 03/10/2022 1:15:41 PM By: Kalman Shan  DO Entered By: Kalman Shan on 03/10/2022 12:27:19 -------------------------------------------------------------------------------- Multi-Disciplinary Care Plan Details Patient Name: Date of Service: [name] Houston RO N J. 03/10/2022 11:00 A M Medical Record Number: [id number] Patient Account Number: [id number] Date of Birth/Sex: Treating RN: [birth year] (66 y.o. Helene Shoe, Tammi Klippel Primary Care Arleth Mccullar: Harlan Stains Other Clinician: Referring Fard Borunda: Treating Landa Mullinax/Extender: [name] Weeks in Treatment: 5 Active Inactive Wound/Skin Impairment Nursing Diagnoses: Impaired tissue integrity Knowledge deficit related to ulceration/compromised skin integrity Goals: Patient will have a decrease in wound volume by X% from date: (specify in notes) Date Initiated: 01/31/2022 Target Resolution Date: 04/21/2022 Goal Status: Active Patient/caregiver will verbalize understanding of skin care regimen Date Initiated: 01/31/2022 Target Resolution Date: 04/27/2022 Goal Status: Active Ulcer/skin breakdown will have a volume reduction of 30% by week 4 Date Initiated: 01/31/2022 Date Inactivated: 02/28/2022 Target Resolution Date: 03/03/2022 Unmet  Reason: 19.9% healed in area Goal Status: Unmet and volume. Continue wound care treatment. Interventions: Assess patient/caregiver ability to obtain necessary supplies Assess patient/caregiver ability to perform ulcer/skin care regimen upon admission and as needed Assess ulceration(s) every visit GERICA, KOBLE ([id number]) [id number] Page 5 of 8 Notes: Electronic Signature(s) Signed: 03/10/2022 2:17:30 PM By: Deon Pilling RN, BSN Entered By: Deon Pilling on 03/10/2022 11:39:25 -------------------------------------------------------------------------------- Pain Assessment Details Patient Name: Date of Service: [name] Houston RO N J. 03/10/2022 11:00 A M Medical Record Number:  [id number] Patient Account Number: [id number] Date of Birth/Sex: Treating RN: [birth year] (66 y.o. [name] Primary Care Evva Din: Harlan Stains Other Clinician: Referring Patton Rabinovich: Treating Atreus Hasz/Extender: [name] Weeks in Treatment: 5 Active Problems Location of Pain Severity and Description of Pain Patient Has Paino Yes Site Locations Pain Location: Pain in Ulcers Rate the pain. Current Pain Level: 5 Pain Management and Medication Current Pain Management: Medication: No Cold Application: No Rest: No Massage: No Activity: No T.E.N.S.: No Heat Application: No Leg drop or elevation: No Is the Current Pain Management Adequate: Adequate How does your wound impact your activities of daily livingo Sleep: No Bathing: No Appetite: No Relationship With Others: No Bladder Continence: No Emotions: No Bowel Continence: No Work: No Toileting: No Drive: No Dressing: No Hobbies: No Electronic Signature(s) Signed: 03/10/2022 2:17:30 PM By: Deon Pilling RN, BSN Entered By: Deon Pilling on 03/10/2022 11:28:12 [name] ([id number]) [id number] Page 6 of 8 -------------------------------------------------------------------------------- Patient/Caregiver Education Details Patient Name: Date of Service: BEKKI, TAVENNER 11/10/2023andnbsp11:00 A M Medical Record Number: [id number] Patient Account Number: [id number] Date of Birth/Gender: Treating RN: 02/28/1956 (66 y.o. Helene Shoe, Tammi Klippel Primary Care Physician: Harlan Stains Other Clinician: Referring Physician: Treating Physician/Extender: Jake Church in Treatment: 5 Education Assessment Education Provided To: Patient Education Topics Provided Wound/Skin Impairment: Handouts: Skin Care Do's and Dont's Methods: Explain/Verbal Responses: Reinforcements needed Electronic Signature(s) Signed: 03/10/2022 2:17:30 PM By: Deon Pilling RN, BSN Entered By: Deon Pilling on 03/10/2022 11:40:01 -------------------------------------------------------------------------------- Wound Assessment Details Patient Name: Date of Service: [name] Houston RO N J. 03/10/2022 11:00 A M Medical Record Number: [id number] Patient Account Number: [id number] Date of Birth/Sex: Treating RN: April 09, 1956 (66 y.o. Helene Shoe, Meta.Reding Primary Care Dorena Dorfman: Harlan Stains Other Clinician: Referring Zyriah Mask: Treating Cristopher Ciccarelli/Extender: [name] Weeks in Treatment: 5 Wound Status Wound Number: 2 Primary Arterial Insufficiency Ulcer Etiology: Wound Location: Left, Posterior Lower Leg Wound Open Wounding Event: Gradually Appeared Status: Date Acquired: 11/21/2021 Comorbid Hypertension, Peripheral Arterial Disease, Peripheral Venous Weeks Of Treatment: 5 History: Disease, Type II Diabetes, Osteoarthritis, Osteomyelitis Clustered Wound: No Photos Wound Measurements Length: (cm) 6.6 Mijangos, Mai J ([id number]) Width: (cm) 5 Depth: (cm) 0.1 Area: (cm) 25 Volume: (cm) 2. % Reduction in Area: 26.7% [id number] Page 7 of 8 % Reduction in Volume: 63.3% Epithelialization: Small (1-33%) .918 Tunneling: No 592 Undermining: No Wound Description Classification: Full Thickness Without Exposed Support Structures Wound Margin: Distinct, outline attached Exudate Amount: Medium Exudate Type: Serosanguineous Exudate Color: red, brown Foul Odor After Cleansing: No Slough/Fibrino Yes Wound Bed Granulation Amount: Large (67-100%) Exposed Structure Granulation Quality: Red, Pink, Hyper-granulation Fascia Exposed: No Necrotic Amount: Small (1-33%) Fat Layer (Subcutaneous Tissue) Exposed: Yes Necrotic Quality: Adherent Slough Tendon Exposed: No Muscle Exposed: No Joint Exposed: No Bone Exposed: No Periwound Skin Texture Texture Color No Abnormalities Noted: No No Abnormalities Noted:  No Callus: No Atrophie Blanche: No Crepitus: No Cyanosis: No Excoriation: No Ecchymosis: No Induration: No Erythema:  No Rash: No Hemosiderin Staining: No Scarring: No Mottled: No Pallor: No Moisture Rubor: No No Abnormalities Noted: No Dry / Scaly: No Temperature / Pain Maceration: No Temperature: No Abnormality Tenderness on Palpation: Yes Treatment Notes Wound #2 (Lower Leg) Wound Laterality: Left, Posterior Cleanser Soap and Water Discharge Instruction: May shower and wash wound with dial antibacterial soap and water prior to dressing change. Wound Cleanser Discharge Instruction: Cleanse the wound with wound cleanser prior to applying a clean dressing using gauze sponges, not tissue or cotton balls. Dakin's Solution 0.5%,16 (oz) Peri-Wound Care Topical Primary Dressing Hydrofera Blue Ready Foam, 4x5 in Discharge Instruction: Apply to wound bed as instructed MediHoney Gel, tube 1.5 (oz) Discharge Instruction: Apply to wound bed under the hydrofera blue. Secondary Dressing ABD Pad, 5x9 Discharge Instruction: Apply over primary dressing as directed. Woven Gauze Sponge, Non-Sterile 4x4 in Discharge Instruction: Apply over primary dressing as directed. Secured With [employer], 4.5x3.1 (in/yd) Discharge Instruction: Secure with Kerlix as directed. 21M Medipore H Soft Cloth Surgical T ape, 4 x 10 (in/yd) Discharge Instruction: Secure with tape as directed. Compression Wrap Tubigrip Size E Discharge Instruction: apply in the morning and remove at night. NISHTHA, RAIDER ([id number]) [id number] Page 8 of 8 Compression Stockings Add-Ons Electronic Signature(s) Signed: 03/10/2022 2:17:30 PM By: Deon Pilling RN, BSN Entered By: Deon Pilling on 03/10/2022 11:33:27 -------------------------------------------------------------------------------- Vitals Details Patient Name: Date of Service: [name] Houston RO N J. 03/10/2022 11:00 A  M Medical Record Number: [id number] Patient Account Number: [id number] Date of Birth/Sex: Treating RN: [birth year] (66 y.o. Helene Shoe, Meta.Reding Primary Care Yuleidy Rappleye: Harlan Stains Other Clinician: Referring Darren Nodal: Treating Izea Livolsi/Extender: [name] Weeks in Treatment: 5 Vital Signs Time Taken: 11:35 Temperature (F): 98.2 Height (in): 64 Pulse (bpm): 74 Weight (lbs): 197 Respiratory Rate (breaths/min): 16 Body Mass Index (BMI): 33.8 Blood Pressure (mmHg): 163/80 Capillary Blood Glucose (mg/dl): 90 Reference Range: 80 - 120 mg / dl Electronic Signature(s) Signed: 03/10/2022 2:17:30 PM By: Deon Pilling RN, BSN Entered By: Deon Pilling on 03/10/2022 11:35:55

## 2022-03-10 NOTE — Progress Notes (Signed)
AEON, KOORS ([id number]) [id number] Page 1 of 9 Visit Report for 03/10/2022 Chief Complaint Document Details Patient Name: Date of Service: [name] Houston, [name] Houston 03/10/2022 11:00 A M Medical Record Number: [id number] Patient Account Number: [id number] Date of Birth/Sex: Treating RN: 09-12-1955 (66 y.o. F) Primary Care Provider: Harlan Stains Other Clinician: Referring Provider: Treating Provider/Extender: [name] Weeks in Treatment: 5 Information Obtained from: Patient Chief Complaint 10/20/2021; Left posterior leg wound 01/31/2022; left posterior leg wound Electronic Signature(s) Signed: 03/10/2022 1:15:41 PM By: Kalman Shan DO Entered By: Kalman Shan on 03/10/2022 12:27:45 -------------------------------------------------------------------------------- Debridement Details Patient Name: Date of Service: [name] Houston RO N J. 03/10/2022 11:00 A M Medical Record Number: [id number] Patient Account Number: [id number] Date of Birth/Sex: Treating RN: 06/17/1955 (66 y.o. Helene Shoe, Meta.Reding Primary Care Provider: Harlan Stains Other Clinician: Referring Provider: Treating Provider/Extender: [name] Weeks in Treatment: 5 Debridement Performed for Assessment: Wound #2 Left,Posterior Lower Leg Performed By: Physician Kalman Shan, DO Debridement Type: Debridement Severity of Tissue Pre Debridement: Fat layer exposed Level of Consciousness (Pre-procedure): Awake and Alert Pre-procedure Verification/Time Out Yes - 11:53 Taken: Start Time: 11:54 Pain Control: Lidocaine 5% topical ointment T Area Debrided (L x W): otal 6.6 (cm) x 5 (cm) = 33 (cm) Tissue and other material debrided: Viable, Non-Viable, Slough, Subcutaneous, Skin: Dermis , Skin: Epidermis, Slough Level: Skin/Subcutaneous Tissue Debridement Description: Excisional Instrument: Curette Bleeding: Minimum Hemostasis Achieved:  Pressure End Time: 11:56 Procedural Pain: 0 Post Procedural Pain: 0 Response to Treatment: Procedure was tolerated well Level of Consciousness (Post- Awake and Alert procedure): Post Debridement Measurements of Total Wound Length: (cm) 6.6 Width: (cm) 5 Depth: (cm) 0.1 Volume: (cm) 2.592 Character of Wound/Ulcer Post Debridement: Improved NAKYIA, DAU ([id number]) [id number] Page 2 of 9 Severity of Tissue Post Debridement: Fat layer exposed Post Procedure Diagnosis Same as Pre-procedure Notes Documented by Deon Pilling, RN for Dr. Heber [place]. Electronic Signature(s) Signed: 03/10/2022 1:15:41 PM By: Kalman Shan DO Signed: 03/10/2022 2:17:30 PM By: Deon Pilling RN, BSN Entered By: Deon Pilling on 03/10/2022 11:56:51 -------------------------------------------------------------------------------- HPI Details Patient Name: Date of Service: [name] Houston RO N J. 03/10/2022 11:00 A M Medical Record Number: [id number] Patient Account Number: [id number] Date of Birth/Sex: Treating RN: Dec 04, 1955 (66 y.o. F) Primary Care Provider: Harlan Stains Other Clinician: Referring Provider: Treating Provider/Extender: Jake Church in Treatment: 5 History of Present Illness HPI Description: Admission 06/26/2021 Ms. Anice Wilshire is a 66 year old female with a past medical history of uncontrolled type 2 diabetes on oral agents with last hemoglobin A1c of 9, peripheral arterial disease that presents to the clinic for a 3-week history of worsening wound to the posterior aspect of her left leg. She is not sure how it started. She has been using normal saline to the wound bed. She had arterial segmental pressures that showed a left ABI of 0.52 she is scheduled to see vein and vascular on 7/5. She is currently on doxycycline prescribed by her primary care doctor. She currently denies systemic signs of infection. 7/7; patient presents for  follow-up. She saw Dr. Donzetta Matters yesterday, vein and vascular to discuss her arterial status. He is scheduling her for an arteriogram for possible intervention. She has had no issues with the Medihoney. 7/24; patient presents for follow-up. She continues to use Medihoney. She is scheduled to have a stress test on 7/28 in order to be cleared for vascular surgery. Plan is for left common femoral endarterectomy and left common femoral to below  the knee popliteal artery bypass. Readmission 01/31/2022 Patient had a left iliofemoral endarterectomy with vein patch angioplasty and common femoral to above-the-knee popliteal bypass with PTFE by Dr. Donzetta Matters on 12/06/2021. She had follow-up with vein and vascular on 01/18/2022 and it was noted that her left foot is well-perfused with brisk DP and PT Doppler signals. She has follow-up again with vein and vascular at the end of this month for bypass duplex and ABIs. She is currently been using Medihoney to the wound bed. She reports improvement to her chronic pain. She currently denies signs of infection. 10/10; patient presents for follow-up. She has been she has been using Dakin's wet-to-dry dressings however she was having pain on dressing changes. We switched to [facility] in the week. She is done better with this. She has no issues or complaints today. 10/17; patient presents for follow-up. She has been using Santyl and Hydrofera Blue to the wound bed. There is been improvement in wound healing. She has no issues or complaints today. 10/31; patient presents for follow-up. She has been using Santyl and Hydrofera Blue to the wound bed. She has no issues or complaints today. She saw vein and vascular on 10/25 they repeated a TBI on the left which was 0.66. She had a bypass graft duplex that showed biphasic waveforms of throughout the left lower extremity 11/10; patient presents for follow-up. She has recently run out of the [address] and has continued to use Hydrofera Blue  to the wound bed. She has no issues or complaints today. Electronic Signature(s) Signed: 03/10/2022 1:15:41 PM By: Kalman Shan DO Entered By: Kalman Shan on 03/10/2022 12:28:23 -------------------------------------------------------------------------------- Physical Exam Details Patient Name: Date of Service: [name] Houston, [name] Houston RO N J. 03/10/2022 11:00 A [name] Houston, [name] Houston ([id number]) [id number] Page 3 of 9 Medical Record Number: [id number] Patient Account Number: [id number] Date of Birth/Sex: Treating RN: 1955-07-31 (66 y.o. F) Primary Care Provider: Harlan Stains Other Clinician: Referring Provider: Treating Provider/Extender: [name] Weeks in Treatment: 5 Constitutional respirations regular, non-labored and within target range for patient.. Cardiovascular 2+ dorsalis pedis/posterior tibialis pulses. Psychiatric pleasant and cooperative. Notes Left lower extremity: T the posterior aspect there is an open wound with nonviable tissue and granulation tissue. No surrounding signs of infection. o Electronic Signature(s) Signed: 03/10/2022 1:15:41 PM By: Kalman Shan DO Entered By: Kalman Shan on 03/10/2022 12:29:41 -------------------------------------------------------------------------------- Physician Orders Details Patient Name: Date of Service: [name] Houston RO N J. 03/10/2022 11:00 A M Medical Record Number: [id number] Patient Account Number: [id number] Date of Birth/Sex: Treating RN: 1956-03-05 (66 y.o. Helene Shoe, Meta.Reding Primary Care Provider: Harlan Stains Other Clinician: Referring Provider: Treating Provider/Extender: Jake Church in Treatment: 5 Verbal / Phone Orders: No Diagnosis Coding ICD-10 Coding Code Description I70.248 Atherosclerosis of native arteries of left leg with ulceration of other part of lower leg L97.828 Non-pressure chronic ulcer of other part of left  lower leg with other specified severity E11.622 Type 2 diabetes mellitus with other skin ulcer Follow-up Appointments ppointment in 1 week. - Dr. Heber [place] Thursday Return A Anesthetic (In clinic) Topical Lidocaine 5% applied to wound bed Bathing/ Shower/ Hygiene May shower and wash wound with soap and water. - can use antibacterial soap Edema Control - Lymphedema / SCD / Other Elevate legs to the level of the heart or above for 30 minutes daily and/or when sitting, a frequency of: - throughout the day Avoid standing for long periods of time. Other Edema Control Orders/Instructions: - Tubigrip size E apply  in the morning and remove at night. Additional Orders / Instructions Follow Nutritious Diet - Monitor/Control Blood Sugars Home Health New wound care orders this week; continue Home Health for wound care. May utilize formulary equivalent dressing for wound treatment orders unless otherwise specified. - Home health to change twice a week. use medihoney instead of santyl. Dressing changes to be completed by [place] on Monday / Wednesday / Friday except when patient has scheduled visit at [facility]. Other Home Health Orders/Instructions: - [facility] home [facility] Wound Treatment Wound #2 - Lower Leg Wound Laterality: Left, Posterior Cleanser: Soap and Water [facility]) 1 x Per Day/30 Days ALANEE, TING ([id number]) [id number] Page 4 of 9 Discharge Instructions: May shower and wash wound with dial antibacterial soap and water prior to dressing change. Cleanser: Wound Cleanser (Home Health) 1 x Per Day/30 Days Discharge Instructions: Cleanse the wound with wound cleanser prior to applying a clean dressing using gauze sponges, not tissue or cotton balls. Cleanser: Dakin's Solution 0.5%,16 (oz) (Home Health) 1 x Per Day/30 Days Prim Dressing: Hydrofera Blue Ready Foam, 4x5 in (Home Health) 1 x Per Day/30 Days ary Discharge Instructions: Apply to  wound bed as instructed Prim Dressing: MediHoney Gel, tube 1.5 (oz) 1 x Per Day/30 Days ary Discharge Instructions: Apply to wound bed under the hydrofera blue. Secondary Dressing: ABD Pad, 5x9 (Home Health) 1 x Per Day/30 Days Discharge Instructions: Apply over primary dressing as directed. Secondary Dressing: Woven Gauze Sponge, Non-Sterile 4x4 in (Home Health) 1 x Per Day/30 Days Discharge Instructions: Apply over primary dressing as directed. Secured With: [employer], 4.5x3.1 (in/yd) (Home Health) 1 x Per Day/30 Days Discharge Instructions: Secure with Kerlix as directed. Secured With: 72M Medipore H Soft Cloth Surgical T ape, 4 x 10 (in/yd) (Home Health) 1 x Per Day/30 Days Discharge Instructions: Secure with tape as directed. Compression Wrap: Tubigrip Size E 1 x Per Day/30 Days Discharge Instructions: apply in the morning and remove at night. Electronic Signature(s) Signed: 03/10/2022 1:15:41 PM By: Kalman Shan DO Entered By: Kalman Shan on 03/10/2022 12:29:51 -------------------------------------------------------------------------------- Problem List Details Patient Name: Date of Service: [name] Houston RO N J. 03/10/2022 11:00 A M Medical Record Number: [id number] Patient Account Number: [id number] Date of Birth/Sex: Treating RN: Aug 16, 1955 (66 y.o. Helene Shoe, Meta.Reding Primary Care Provider: Harlan Stains Other Clinician: Referring Provider: Treating Provider/Extender: Jake Church in Treatment: 5 Active Problems ICD-10 Encounter Code Description Active Date MDM Diagnosis I70.248 Atherosclerosis of native arteries of left leg with ulceration of other part of 01/31/2022 No Yes lower leg L97.828 Non-pressure chronic ulcer of other part of left lower leg with other specified 01/31/2022 No Yes severity E11.622 Type 2 diabetes mellitus with other skin ulcer 01/31/2022 No Yes Inactive Problems Resolved Problems CHERREE, CONERLY  ([id number]) [id number] Page 5 of 9 Electronic Signature(s) Signed: 03/10/2022 1:15:41 PM By: Kalman Shan DO Entered By: Kalman Shan on 03/10/2022 12:27:11 -------------------------------------------------------------------------------- Progress Note Details Patient Name: Date of Service: [name] Houston RO N J. 03/10/2022 11:00 A M Medical Record Number: [id number] Patient Account Number: [id number] Date of Birth/Sex: Treating RN: May 21, 1955 (66 y.o. F) Primary Care Provider: Harlan Stains Other Clinician: Referring Provider: Treating Provider/Extender: Jake Church in Treatment: 5 Subjective Chief Complaint Information obtained from Patient 10/20/2021; Left posterior leg wound 01/31/2022; left posterior leg wound History of Present Illness (HPI) Admission 06/26/2021 Ms. Auden Wettstein is a 66 year old female with a past medical history of uncontrolled type 2 diabetes on  oral agents with last hemoglobin A1c of 9, peripheral arterial disease that presents to the clinic for a 3-week history of worsening wound to the posterior aspect of her left leg. She is not sure how it started. She has been using normal saline to the wound bed. She had arterial segmental pressures that showed a left ABI of 0.52 she is scheduled to see vein and vascular on 7/5. She is currently on doxycycline prescribed by her primary care doctor. She currently denies systemic signs of infection. 7/7; patient presents for follow-up. She saw Dr. Donzetta Matters yesterday, vein and vascular to discuss her arterial status. He is scheduling her for an arteriogram for possible intervention. She has had no issues with the Medihoney. 7/24; patient presents for follow-up. She continues to use Medihoney. She is scheduled to have a stress test on 7/28 in order to be cleared for vascular surgery. Plan is for left common femoral endarterectomy and left common femoral to below the knee  popliteal artery bypass. Readmission 01/31/2022 Patient had a left iliofemoral endarterectomy with vein patch angioplasty and common femoral to above-the-knee popliteal bypass with PTFE by Dr. Donzetta Matters on 12/06/2021. She had follow-up with vein and vascular on 01/18/2022 and it was noted that her left foot is well-perfused with brisk DP and PT Doppler signals. She has follow-up again with vein and vascular at the end of this month for bypass duplex and ABIs. She is currently been using Medihoney to the wound bed. She reports improvement to her chronic pain. She currently denies signs of infection. 10/10; patient presents for follow-up. She has been she has been using Dakin's wet-to-dry dressings however she was having pain on dressing changes. We switched to [facility] in the week. She is done better with this. She has no issues or complaints today. 10/17; patient presents for follow-up. She has been using Santyl and Hydrofera Blue to the wound bed. There is been improvement in wound healing. She has no issues or complaints today. 10/31; patient presents for follow-up. She has been using Santyl and Hydrofera Blue to the wound bed. She has no issues or complaints today. She saw vein and vascular on 10/25 they repeated a TBI on the left which was 0.66. She had a bypass graft duplex that showed biphasic waveforms of throughout the left lower extremity 11/10; patient presents for follow-up. She has recently run out of the [place] and has continued to use Hydrofera Blue to the wound bed. She has no issues or complaints today. Patient History Information obtained from Patient, Caregiver. Family History Cancer - Father, Diabetes - Mother,Maternal Grandparents,Father, Hypertension - Mother, No family history of Heart Disease, Hereditary Spherocytosis, Kidney Disease, Lung Disease, Seizures, Stroke, Thyroid Problems, Tuberculosis. Social History Former smoker, Marital Status - Married, Alcohol Use - Rarely,  Drug Use - No History, Caffeine Use - Daily. Medical History Eyes Denies history of Cataracts, Glaucoma, Optic Neuritis Cardiovascular Patient has history of Hypertension, Peripheral Arterial Disease, Peripheral Venous Disease Denies history of Angina, Arrhythmia, Coronary Artery Disease, Deep Vein Thrombosis, Hypotension, Myocardial Infarction, Phlebitis, Vasculitis Gastrointestinal Denies history of Cirrhosis , Colitis, Crohnoos, Hepatitis A, Hepatitis B, Hepatitis C Endocrine Patient has history of Type II Diabetes - A1C-9.1 Denies history of Type I Diabetes Immunological Denies history of Lupus Erythematosus, Raynaudoos, Scleroderma Musculoskeletal Patient has history of Osteoarthritis, Osteomyelitis - Hx ankle DRISHTI, PEPPERMAN ([id number]) [id number] Page 6 of 9 Denies history of Gout, Rheumatoid Arthritis Neurologic Denies history of Neuropathy, Quadriplegia, Paraplegia, Seizure Disorder Hospitalization/Surgery History - left  knee. Medical A Surgical History Notes nd Genitourinary CKD-3 Objective Constitutional respirations regular, non-labored and within target range for patient.. Vitals Time Taken: 11:35 AM, Height: 64 in, Weight: 197 lbs, BMI: 33.8, Temperature: 98.2 F, Pulse: 74 bpm, Respiratory Rate: 16 breaths/min, Blood Pressure: 163/80 mmHg, Capillary Blood Glucose: 90 mg/dl. Cardiovascular 2+ dorsalis pedis/posterior tibialis pulses. Psychiatric pleasant and cooperative. General Notes: Left lower extremity: T the posterior aspect there is an open wound with nonviable tissue and granulation tissue. No surrounding signs of o infection. Integumentary (Hair, Skin) Wound #2 status is Open. Original cause of wound was Gradually Appeared. The date acquired was: 11/21/2021. The wound has been in treatment 5 weeks. The wound is located on the Left,Posterior Lower Leg. The wound measures 6.6cm length x 5cm width x 0.1cm depth; 25.918cm^2  area and 2.592cm^3 volume. There is Fat Layer (Subcutaneous Tissue) exposed. There is no tunneling or undermining noted. There is a medium amount of serosanguineous drainage noted. The wound margin is distinct with the outline attached to the wound base. There is large (67-100%) red, pink, hyper - granulation within the wound bed. There is a small (1-33%) amount of necrotic tissue within the wound bed including Adherent Slough. The periwound skin appearance did not exhibit: Callus, Crepitus, Excoriation, Induration, Rash, Scarring, Dry/Scaly, Maceration, Atrophie Blanche, Cyanosis, Ecchymosis, Hemosiderin Staining, Mottled, Pallor, Rubor, Erythema. Periwound temperature was noted as No Abnormality. The periwound has tenderness on palpation. Assessment Active Problems ICD-10 Atherosclerosis of native arteries of left leg with ulceration of other part of lower leg Non-pressure chronic ulcer of other part of left lower leg with other specified severity Type 2 diabetes mellitus with other skin ulcer Patient's wound has shown improvement in size and appearance since last clinic visit. I debrided nonviable tissue. The previous Santyl tube was a charity donation and she has run out of this. At this time I recommended switching from Santyl to Medihoney and using Hydrofera Blue. Continue Tubigrip. Procedures Wound #2 Pre-procedure diagnosis of Wound #2 is an Arterial Insufficiency Ulcer located on the Left,Posterior Lower Leg .Severity of Tissue Pre Debridement is: Fat layer exposed. There was a Excisional Skin/Subcutaneous Tissue Debridement with a total area of 33 sq cm performed by Kalman Shan, DO. With the following instrument(s): Curette to remove Viable and Non-Viable tissue/material. Material removed includes Subcutaneous Tissue, Slough, Skin: Dermis, and Skin: Epidermis after achieving pain control using Lidocaine 5% topical ointment. A time out was conducted at 11:53, prior to the start of  the procedure. A Minimum amount of bleeding was controlled with Pressure. The procedure was tolerated well with a pain level of 0 throughout and a pain level of 0 following the procedure. Post Debridement Measurements: 6.6cm length x 5cm width x 0.1cm depth; 2.592cm^3 volume. Character of Wound/Ulcer Post Debridement is improved. Severity of Tissue Post Debridement is: Fat layer exposed. Post procedure Diagnosis Wound #2: Same as Pre-Procedure General Notes: Documented by Deon Pilling, RN for Dr. Heber Brainards.[address] [name] Houston, [name] Houston ([id number]) [id number] Page 7 of 9 Follow-up Appointments: Return Appointment in 1 week. - Dr. Heber [place] Thursday Anesthetic: (In clinic) Topical Lidocaine 5% applied to wound bed Bathing/ Shower/ Hygiene: May shower and wash wound with soap and water. - can use antibacterial soap Edema Control - Lymphedema / SCD / Other: Elevate legs to the level of the heart or above for 30 minutes daily and/or when sitting, a frequency of: - throughout the day Avoid standing for long periods of time. Other Edema Control Orders/Instructions: - Tubigrip size E  apply in the morning and remove at night. Additional Orders / Instructions: Follow Nutritious Diet - Monitor/Control Blood Sugars Home Health: New wound care orders this week; continue Home Health for wound care. May utilize formulary equivalent dressing for wound treatment orders unless otherwise specified. - Home health to change twice a week. use medihoney instead of santyl. Dressing changes to be completed by [place] on Monday / Wednesday / Friday except when patient has scheduled visit at [facility]. Other Home Health Orders/Instructions: - [facility] home [facility] WOUND #2: - Lower Leg Wound Laterality: Left, Posterior Cleanser: Soap and Water (Home Health) 1 x Per Day/30 Days Discharge Instructions: May shower and wash wound with dial antibacterial soap and water prior to dressing  change. Cleanser: Wound Cleanser (Home Health) 1 x Per Day/30 Days Discharge Instructions: Cleanse the wound with wound cleanser prior to applying a clean dressing using gauze sponges, not tissue or cotton balls. Cleanser: Dakin's Solution 0.5%,16 (oz) (Home Health) 1 x Per Day/30 Days Prim Dressing: Hydrofera Blue Ready Foam, 4x5 in (Home Health) 1 x Per Day/30 Days ary Discharge Instructions: Apply to wound bed as instructed Prim Dressing: MediHoney Gel, tube 1.5 (oz) 1 x Per Day/30 Days ary Discharge Instructions: Apply to wound bed under the hydrofera blue. Secondary Dressing: ABD Pad, 5x9 (Home Health) 1 x Per Day/30 Days Discharge Instructions: Apply over primary dressing as directed. Secondary Dressing: Woven Gauze Sponge, Non-Sterile 4x4 in (Home Health) 1 x Per Day/30 Days Discharge Instructions: Apply over primary dressing as directed. Secured With: [employer], 4.5x3.1 (in/yd) (Home Health) 1 x Per Day/30 Days Discharge Instructions: Secure with Kerlix as directed. Secured With: 80M Medipore H Soft Cloth Surgical T ape, 4 x 10 (in/yd) (Home Health) 1 x Per Day/30 Days Discharge Instructions: Secure with tape as directed. Com pression Wrap: Tubigrip Size E 1 x Per Day/30 Days Discharge Instructions: apply in the morning and remove at night. 1. In office sharp debridement 2. Hydrofera Blue and Medihoney 3. Tubigrip 4. Follow-up in 1 week Electronic Signature(s) Signed: 03/10/2022 1:15:41 PM By: Kalman Shan DO Entered By: Kalman Shan on 03/10/2022 12:38:35 -------------------------------------------------------------------------------- HxROS Details Patient Name: Date of Service: [name] Houston RO N J. 03/10/2022 11:00 A M Medical Record Number: [id number] Patient Account Number: [id number] Date of Birth/Sex: Treating RN: 11-06-1955 (66 y.o. F) Primary Care Provider: Harlan Stains Other Clinician: Referring Provider: Treating Provider/Extender: Jake Church in Treatment: 5 Information Obtained From Patient Caregiver Eyes Medical History: Negative for: Cataracts; Glaucoma; Optic Neuritis Cardiovascular Medical History: Positive for: Hypertension; Peripheral Arterial Disease; Peripheral Venous Disease Negative for: Angina; Arrhythmia; Coronary Artery Disease; Deep Vein Thrombosis; Hypotension; Myocardial Infarction; Phlebitis; Vasculitis [name] Houston, [name] Houston ([id number]) [id number] Page 8 of 9 Gastrointestinal Medical History: Negative for: Cirrhosis ; Colitis; Crohns; Hepatitis A; Hepatitis B; Hepatitis C Endocrine Medical History: Positive for: Type II Diabetes - A1C-9.1 Negative for: Type I Diabetes Time with diabetes: 12 years Treated with: Oral agents Blood sugar tested every day: No Genitourinary Medical History: Past Medical History Notes: CKD-3 Immunological Medical History: Negative for: Lupus Erythematosus; Raynauds; Scleroderma Musculoskeletal Medical History: Positive for: Osteoarthritis; Osteomyelitis - Hx ankle Negative for: Gout; Rheumatoid Arthritis Neurologic Medical History: Negative for: Neuropathy; Quadriplegia; Paraplegia; Seizure Disorder Immunizations Pneumococcal Vaccine: Received Pneumococcal Vaccination: No Implantable Devices None Hospitalization / Surgery History Type of Hospitalization/Surgery left knee Family and Social History Cancer: Yes - Father; Diabetes: Yes - Mother,Maternal Grandparents,Father; Heart Disease: No; Hereditary Spherocytosis: No; Hypertension: Yes - Mother; Kidney  Disease: No; Lung Disease: No; Seizures: No; Stroke: No; Thyroid Problems: No; Tuberculosis: No; Former smoker; Marital Status - Married; Alcohol Use: Rarely; Drug Use: No History; Caffeine Use: Daily; Financial Concerns: No; Food, Clothing or Shelter Needs: No; Support System Lacking: No; Transportation Concerns: No Electronic Signature(s) Signed:  03/10/2022 1:15:41 PM By: Kalman Shan DO Entered By: Kalman Shan on 03/10/2022 12:28:31 -------------------------------------------------------------------------------- SuperBill Details Patient Name: Date of Service: [name] Houston RO N J. 03/10/2022 Medical Record Number: [id number] Patient Account Number: [id number] Date of Birth/Sex: Treating RN: 1955-07-03 (66 y.o. [name] Houston Primary Care Provider: Harlan Stains Other Clinician: Referring Provider: Treating Provider/Extender: Jake Church in Treatment: 5 [place], [place] ([id number]) [id number] Page 9 of 9 Diagnosis Coding ICD-10 Codes Code Description I70.248 Atherosclerosis of native arteries of left leg with ulceration of other part of lower leg L97.828 Non-pressure chronic ulcer of other part of left lower leg with other specified severity E11.622 Type 2 diabetes mellitus with other skin ulcer Facility Procedures : CPT4 Code: 81829937 Description: 16967 - DEB SUBQ TISSUE 20 SQ CM/< ICD-10 Diagnosis Description L97.828 Non-pressure chronic ulcer of other part of left lower leg with other specified E11.622 Type 2 diabetes mellitus with other skin ulcer Modifier: severity Quantity: 1 : CPT4 Code: 89381017 Description: 51025 - DEB SUBQ TISS EA ADDL 20CM ICD-10 Diagnosis Description L97.828 Non-pressure chronic ulcer of other part of left lower leg with other specified E11.622 Type 2 diabetes mellitus with other skin ulcer Modifier: severity Quantity: 1 Physician Procedures : CPT4 Code Description Modifier [id number] 11042 - WC PHYS SUBQ TISS 20 SQ CM ICD-10 Diagnosis Description L97.828 Non-pressure chronic ulcer of other part of left lower leg with other specified severity E11.622 Type 2 diabetes mellitus with other skin  ulcer Quantity: 1 : 4235361 11045 - WC PHYS SUBQ TISS EA ADDL 20 CM ICD-10 Diagnosis Description L97.828 Non-pressure chronic ulcer of other  part of left lower leg with other specified severity E11.622 Type 2 diabetes mellitus with other skin ulcer Quantity: 1 Electronic Signature(s) Signed: 03/10/2022 1:15:41 PM By: Kalman Shan DO Entered By: Kalman Shan on 03/10/2022 12:39:04

## 2022-03-16 ENCOUNTER — Encounter (HOSPITAL_BASED_OUTPATIENT_CLINIC_OR_DEPARTMENT_OTHER): Payer: HMO | Admitting: Internal Medicine

## 2022-03-16 DIAGNOSIS — L97828 Non-pressure chronic ulcer of other part of left lower leg with other specified severity: Secondary | ICD-10-CM | POA: Diagnosis not present

## 2022-03-16 DIAGNOSIS — E11622 Type 2 diabetes mellitus with other skin ulcer: Secondary | ICD-10-CM | POA: Diagnosis not present

## 2022-03-17 NOTE — Progress Notes (Signed)
TOSHUA, HONSINGER ([id number]) [id number] Page 1 of 8 Visit Report for 03/16/2022 Arrival Information Details Patient Name: Date of Service: [name] Houston, [name] Houston 03/16/2022 1:00 PM Medical Record Number: [id number] Patient Account Number: [id number] Date of Birth/Sex: Treating RN: 12-28-1955 (66 y.o. F) Primary Care Quintasia Theroux: Harlan Stains Other Clinician: Referring Nekhi Liwanag: Treating [name] in Treatment: 6 Visit Information History Since Last Visit Added or deleted any medications: No Patient Arrived: Walker Any new allergies or adverse reactions: No Arrival Time: 12:54 Had a fall or experienced change in No Accompanied By: husband activities of daily living that may affect Transfer Assistance: None risk of falls: Patient Identification Verified: Yes Signs or symptoms of abuse/neglect since last visito No Secondary Verification Process Completed: Yes Hospitalized since last visit: No Patient Requires Transmission-Based Precautions: No Implantable device outside of the clinic excluding No Patient Has Alerts: No cellular tissue based products placed in the center since last visit: Has Dressing in Place as Prescribed: Yes Pain Present Now: Yes Electronic Signature(s) Signed: 03/17/2022 12:40:42 PM By: Erenest Blank Entered By: Erenest Blank on 03/16/2022 12:58:20 -------------------------------------------------------------------------------- Encounter Discharge Information Details Patient Name: Date of Service: [name] Houston RO N J. 03/16/2022 1:00 PM Medical Record Number: [id number] Patient Account Number: [id number] Date of Birth/Sex: Treating RN: 11-03-1955 (66 y.o. [name], [name] Primary Care Crisanto Nied: Harlan Stains Other Clinician: Referring Oluwakemi Salsberry: Treating Jakson Delpilar/Extender: Jake Church in Treatment: 6 Encounter Discharge Information Items Post  Procedure Vitals Discharge Condition: Stable Temperature (F): 98.2 Ambulatory Status: Ambulatory Pulse (bpm): 74 Discharge Destination: Home Respiratory Rate (breaths/min): 17 Transportation: Private Auto Blood Pressure (mmHg): 134/74 Accompanied By: husband Schedule Follow-up Appointment: Yes Clinical Summary of Care: Patient Declined Electronic Signature(s) Signed: 03/17/2022 12:09:11 PM By: Rhae Hammock RN Entered By: Rhae Hammock on 03/16/2022 13:31:28 [name] Houston ([id number]) [id number] Page 2 of 8 -------------------------------------------------------------------------------- Lower Extremity Assessment Details Patient Name: Date of Service: [name] Houston, [name] Houston 03/16/2022 1:00 PM Medical Record Number: [id number] Patient Account Number: [id number] Date of Birth/Sex: Treating RN: 1955/11/15 (66 y.o. F) Primary Care Lola Czerwonka: Harlan Stains Other Clinician: Referring Ronya Gilcrest: Treating Ebrahim Deremer/Extender: [name] Edema Assessment Assessed: [Left: No] [Right: No] Edema: [Left: Ye] [Right: s] Calf Left: Right: Point of Measurement: 29 cm From Medial Instep 40.2 cm Ankle Left: Right: Point of Measurement: 7 cm From Medial Instep 23.5 cm Electronic Signature(s) Signed: 03/17/2022 12:40:42 PM By: Erenest Blank Entered By: Erenest Blank on 03/16/2022 13:05:44 -------------------------------------------------------------------------------- Multi Wound Chart Details Patient Name: Date of Service: [name] Houston RO N J. 03/16/2022 1:00 PM Medical Record Number: [id number] Patient Account Number: [id number] Date of Birth/Sex: Treating RN: 09/05/1955 (66 y.o. F) Primary Care Jiro Kiester: Harlan Stains Other Clinician: Referring Odena Mcquaid: Treating Waleska Buttery/Extender: [name] Vital Signs Height(in): 64 Capillary Blood Glucose(mg/dl):  88 Weight(lbs): 197 Pulse(bpm): 73 Body Mass Index(BMI): 33.8 Blood Pressure(mmHg): 143/82 Temperature(F): 98.1 Respiratory Rate(breaths/min): 17 [2:Photos:] [N/A:N/A] Left, Posterior Lower Leg N/A N/A Wound Location: Gradually Appeared N/A N/A Wounding Event: Arterial Insufficiency Ulcer N/A N/A Primary Etiology: Hypertension, Peripheral Arterial N/A N/A Comorbid History: Disease, Peripheral Venous Disease, Type II Diabetes, Osteoarthritis, [name] Houston ([id number]) [id number] Page 3 of 8 Osteomyelitis 11/21/2021 N/A N/A Date Acquired: 6 N/A N/A Weeks of Treatment: Open N/A N/A Wound Status: No N/A N/A Wound Recurrence: 5.8x4.5x0.1 N/A N/A Measurements L x W x D (cm) 20.499 N/A N/A A (cm) : rea 2.05 N/A N/A Volume (cm) :  42.00% N/A N/A % Reduction in Area: 71.00% N/A N/A % Reduction in Volume: Full Thickness Without Exposed N/A N/A Classification: Support Structures Medium N/A N/A Exudate A mount: Serosanguineous N/A N/A Exudate Type: red, brown N/A N/A Exudate Color: Distinct, outline attached N/A N/A Wound Margin: Large (67-100%) N/A N/A Granulation A mount: Red, Pink, Hyper-granulation N/A N/A Granulation Quality: Small (1-33%) N/A N/A Necrotic A mount: Fat Layer (Subcutaneous Tissue): Yes N/A N/A Exposed Structures: Fascia: No Tendon: No Muscle: No Joint: No Bone: No Small (1-33%) N/A N/A Epithelialization: Debridement - Excisional N/A N/A Debridement: Pre-procedure Verification/Time Out 13:30 N/A N/A Taken: Lidocaine N/A N/A Pain Control: Subcutaneous, Slough N/A N/A Tissue Debrided: Skin/Subcutaneous Tissue N/A N/A Level: 26.1 N/A N/A Debridement A (sq cm): rea Curette N/A N/A Instrument: Minimum N/A N/A Bleeding: Pressure N/A N/A Hemostasis A chieved: 0 N/A N/A Procedural Pain: 0 N/A N/A Post Procedural Pain: Procedure was tolerated well N/A N/A Debridement Treatment  Response: 5.8x4.5x0.1 N/A N/A Post Debridement Measurements L x W x D (cm) 2.05 N/A N/A Post Debridement Volume: (cm) Excoriation: No N/A N/A Periwound Skin Texture: Induration: No Callus: No Crepitus: No Rash: No Scarring: No Maceration: No N/A N/A Periwound Skin Moisture: Dry/Scaly: No Atrophie Blanche: No N/A N/A Periwound Skin Color: Cyanosis: No Ecchymosis: No Erythema: No Hemosiderin Staining: No Mottled: No Pallor: No Rubor: No No Abnormality N/A N/A Temperature: Yes N/A N/A Tenderness on Palpation: Debridement N/A N/A Procedures Performed: Treatment Notes Wound #2 (Lower Leg) Wound Laterality: Left, Posterior Cleanser Soap and Water Discharge Instruction: May shower and wash wound with dial antibacterial soap and water prior to dressing change. Wound Cleanser Discharge Instruction: Cleanse the wound with wound cleanser prior to applying a clean dressing using gauze sponges, not tissue or cotton balls. Dakin's Solution 0.5%,16 (oz) Peri-Wound Care Topical Primary Dressing Hydrofera Blue Ready Foam, 4x5 in Discharge Instruction: Apply to wound bed as instructed MediHoney Gel, tube 1.5 (oz) Discharge Instruction: Apply to wound bed under the hydrofera blue. SUNDEEP, CARY ([id number]) [id number] Page 4 of 8 Secondary Dressing ABD Pad, 5x9 Discharge Instruction: Apply over primary dressing as directed. Woven Gauze Sponge, Non-Sterile 4x4 in Discharge Instruction: Apply over primary dressing as directed. Secured With [employer], 4.5x3.1 (in/yd) Discharge Instruction: Secure with Kerlix as directed. 59M Medipore H Soft Cloth Surgical T ape, 4 x 10 (in/yd) Discharge Instruction: Secure with tape as directed. Compression Wrap Tubigrip Size E Discharge Instruction: apply in the morning and remove at night. Compression Stockings Add-Ons Electronic Signature(s) Signed: 03/16/2022 5:05:23 PM By: Kalman Shan  DO Entered By: Kalman Shan on 03/16/2022 13:43:41 -------------------------------------------------------------------------------- Multi-Disciplinary Care Plan Details Patient Name: Date of Service: [name] Houston RO N J. 03/16/2022 1:00 PM Medical Record Number: [id number] Patient Account Number: [id number] Date of Birth/Sex: Treating RN: 02-10-1956 (66 y.o. [name], [name] Primary Care Leiah Giannotti: Harlan Stains Other Clinician: Referring Rhythm Gubbels: Treating Gila Lauf/Extender: [name] Active Inactive Wound/Skin Impairment Nursing Diagnoses: Impaired tissue integrity Knowledge deficit related to ulceration/compromised skin integrity Goals: Patient will have a decrease in wound volume by X% from date: (specify in notes) Date Initiated: 01/31/2022 Target Resolution Date: 04/21/2022 Goal Status: Active Patient/caregiver will verbalize understanding of skin care regimen Date Initiated: 01/31/2022 Target Resolution Date: 04/27/2022 Goal Status: Active Ulcer/skin breakdown will have a volume reduction of 30% by week 4 Date Initiated: 01/31/2022 Date Inactivated: 02/28/2022 Target Resolution Date: 03/03/2022 Unmet Reason: 19.9% healed in area Goal Status: Unmet and volume. Continue wound care treatment. Interventions:  Assess patient/caregiver ability to obtain necessary supplies Assess patient/caregiver ability to perform ulcer/skin care regimen upon admission and as needed Assess ulceration(s) every visit Notes: Electronic Signature(s) Signed: 03/17/2022 12:09:11 PM By: Rhae Hammock RN Creig Hines, Arlana J [id number] By: Rhae Hammock RN [phone].pdf Page 5 of 8 Signed: 03/17/2022 12:09:11 Entered By: Rhae Hammock on 03/16/2022 13:28:09 -------------------------------------------------------------------------------- Pain Assessment Details Patient Name: Date of Service: [name] Houston, [name] Houston 03/16/2022 1:00 PM Medical Record Number: [id number] Patient Account Number: [id number] Date of Birth/Sex: Treating RN: 1955/06/30 (66 y.o. F) Primary Care Jalaila Caradonna: Harlan Stains Other Clinician: Referring Daniell Mancinas: Treating Nuala Chiles/Extender: Jake Church in Treatment: 6 Active Problems Location of Pain Severity and Description of Pain Patient Has Paino Yes Site Locations Pain Location: Pain in Ulcers Rate the pain. Current Pain Level: 6 Pain Management and Medication Current Pain Management: Notes more pain at night Electronic Signature(s) Signed: 03/17/2022 12:40:42 PM By: Erenest Blank Entered By: Erenest Blank on 03/16/2022 12:58:47 -------------------------------------------------------------------------------- Patient/Caregiver Education Details Patient Name: Date of Service: [name] Houston 11/16/2023andnbsp1:00 PM Medical Record Number: [id number] Patient Account Number: [id number] Date of Birth/Gender: Treating RN: 1955/08/26 (66 y.o. [name], [name] Primary Care Physician: Harlan Stains Other Clinician: Referring Physician: Treating Physician/Extender: Jake Church in Treatment: 6 Education Assessment Education Provided To: [name] Houston, [name] Houston ([id number]) [id number] Page 6 of 8 Patient Education Topics Provided Wound/Skin Impairment: Methods: Explain/Verbal Responses: State content correctly Electronic Signature(s) Signed: 03/17/2022 12:09:11 PM By: Rhae Hammock RN Entered By: Rhae Hammock on 03/16/2022 13:28:21 -------------------------------------------------------------------------------- Wound Assessment Details Patient Name: Date of Service: [name] Houston RO N J. 03/16/2022 1:00 PM Medical Record Number: [id number] Patient Account Number: [id number] Date of Birth/Sex: Treating RN: 08-Apr-1956 (66 y.o. F) Primary Care Marvetta Vohs: Harlan Stains Other  Clinician: Referring Jontae Sonier: Treating Mariana Goytia/Extender: [name] Wound Status Wound Number: 2 Primary Arterial Insufficiency Ulcer Etiology: Wound Location: Left, Posterior Lower Leg Wound Open Wounding Event: Gradually Appeared Status: Date Acquired: 11/21/2021 Comorbid Hypertension, Peripheral Arterial Disease, Peripheral Venous Weeks Of Treatment: 6 History: Disease, Type II Diabetes, Osteoarthritis, Osteomyelitis Clustered Wound: No Photos Wound Measurements Length: (cm) 5.8 Width: (cm) 4.5 Depth: (cm) 0.1 Area: (cm) 20.499 Volume: (cm) 2.05 % Reduction in Area: 42% % Reduction in Volume: 71% Epithelialization: Small (1-33%) Tunneling: No Undermining: No Wound Description Classification: Full Thickness Without Exposed Support Structures Wound Margin: Distinct, outline attached Exudate Amount: Medium Exudate Type: Serosanguineous Exudate Color: red, brown Foul Odor After Cleansing: No Slough/Fibrino Yes Wound Bed Granulation Amount: Large (67-100%) Exposed Structure Granulation Quality: Red, Pink, Hyper-granulation Fascia Exposed: No Necrotic Amount: Small (1-33%) Fat Layer (Subcutaneous Tissue) Exposed: Yes Necrotic Quality: Adherent Slough Tendon Exposed: No Muscle Exposed: No Joint Exposed: No Bone Exposed: No [name] Houston, [name] Houston ([id number]) [id number] Page 7 of 8 Periwound Skin Texture Texture Color No Abnormalities Noted: No No Abnormalities Noted: No Callus: No Atrophie Blanche: No Crepitus: No Cyanosis: No Excoriation: No Ecchymosis: No Induration: No Erythema: No Rash: No Hemosiderin Staining: No Scarring: No Mottled: No Pallor: No Moisture Rubor: No No Abnormalities Noted: No Dry / Scaly: No Temperature / Pain Maceration: No Temperature: No Abnormality Tenderness on Palpation: Yes Treatment Notes Wound #2 (Lower Leg) Wound Laterality: Left,  Posterior Cleanser Soap and Water Discharge Instruction: May shower and wash wound with dial antibacterial soap and water prior to dressing change. Wound Cleanser Discharge Instruction: Cleanse the wound with wound cleanser prior to applying a clean dressing using gauze sponges, not tissue  or cotton balls. Dakin's Solution 0.5%,16 (oz) Peri-Wound Care Topical Primary Dressing Hydrofera Blue Ready Foam, 4x5 in Discharge Instruction: Apply to wound bed as instructed MediHoney Gel, tube 1.5 (oz) Discharge Instruction: Apply to wound bed under the hydrofera blue. Secondary Dressing ABD Pad, 5x9 Discharge Instruction: Apply over primary dressing as directed. Woven Gauze Sponge, Non-Sterile 4x4 in Discharge Instruction: Apply over primary dressing as directed. Secured With [employer], 4.5x3.1 (in/yd) Discharge Instruction: Secure with Kerlix as directed. 47M Medipore H Soft Cloth Surgical T ape, 4 x 10 (in/yd) Discharge Instruction: Secure with tape as directed. Compression Wrap Tubigrip Size E Discharge Instruction: apply in the morning and remove at night. Compression Stockings Add-Ons Electronic Signature(s) Signed: 03/17/2022 12:40:42 PM By: Erenest Blank Entered By: Erenest Blank on 03/16/2022 13:08:00 -------------------------------------------------------------------------------- Vitals Details Patient Name: Date of Service: [name] Houston RO N J. 03/16/2022 1:00 PM Medical Record Number: [id number] Patient Account Number: [id number] Date of Birth/Sex: Treating RN: [birth year] (66 y.o. Abraham Margulies, [name] ([id number]) [id number] Page 8 of 8 Primary Care Debbi Strandberg: Harlan Stains Other Clinician: Referring Husam Hohn: Treating Sharmain Lastra/Extender: Jake Church in Treatment: 6 Vital Signs Time Taken: 12:58 Temperature (F): 98.1 Height (in): 64 Pulse (bpm): 73 Weight (lbs): 197 Respiratory Rate (breaths/min):  17 Body Mass Index (BMI): 33.8 Blood Pressure (mmHg): 143/82 Capillary Blood Glucose (mg/dl): 88 Reference Range: 80 - 120 mg / dl Electronic Signature(s) Signed: 03/17/2022 12:40:42 PM By: Erenest Blank Entered By: Erenest Blank on 03/16/2022 12:59:41

## 2022-03-17 NOTE — Progress Notes (Signed)
REMONIA, OTTE ([id number]) [id number] Page 1 of 9 Visit Report for 03/16/2022 Chief Complaint Document Details Patient Name: Date of Service: [name] Houston, [name] Houston 03/16/2022 1:00 PM Medical Record Number: [id number] Patient Account Number: [id number] Date of Birth/Sex: Treating RN: 18-Sep-1955 (66 y.o. F) Primary Care Provider: Harlan Stains Other Clinician: Referring Provider: Treating Provider/Extender: [name] Information Obtained from: Patient Chief Complaint 10/20/2021; Left posterior leg wound 01/31/2022; left posterior leg wound Electronic Signature(s) Signed: 03/16/2022 5:05:23 PM By: Kalman Shan DO Entered By: Kalman Shan on 03/16/2022 13:43:48 -------------------------------------------------------------------------------- Debridement Details Patient Name: Date of Service: [name] Houston RO N J. 03/16/2022 1:00 PM Medical Record Number: [id number] Patient Account Number: [id number] Date of Birth/Sex: Treating RN: 1956/03/05 (66 y.o. [name], [name] Primary Care Provider: Harlan Stains Other Clinician: Referring Provider: Treating Provider/Extender: [name] Debridement Performed for Assessment: Wound #2 Left,Posterior Lower Leg Performed By: Physician Kalman Shan, DO Debridement Type: Debridement Severity of Tissue Pre Debridement: Fat layer exposed Level of Consciousness (Pre-procedure): Awake and Alert Pre-procedure Verification/Time Out Yes - 13:30 Taken: Start Time: 13:30 Pain Control: Lidocaine T Area Debrided (L x W): otal 5.8 (cm) x 4.5 (cm) = 26.1 (cm) Tissue and other material debrided: Viable, Non-Viable, Slough, Subcutaneous, Slough Level: Skin/Subcutaneous Tissue Debridement Description: Excisional Instrument: Curette Bleeding: Minimum Hemostasis Achieved: Pressure End Time: 13:30 Procedural Pain: 0 Post  Procedural Pain: 0 Response to Treatment: Procedure was tolerated well Level of Consciousness (Post- Awake and Alert procedure): Post Debridement Measurements of Total Wound Length: (cm) 5.8 Width: (cm) 4.5 Depth: (cm) 0.1 Volume: (cm) 2.05 Character of Wound/Ulcer Post Debridement: Improved MEENAKSHI, SAZAMA ([id number]) [id number] Page 2 of 9 Severity of Tissue Post Debridement: Fat layer exposed Post Procedure Diagnosis Same as Pre-procedure Electronic Signature(s) Signed: 03/16/2022 5:05:23 PM By: Kalman Shan DO Signed: 03/17/2022 12:09:11 PM By: Rhae Hammock RN Entered By: Rhae Hammock on 03/16/2022 13:29:27 -------------------------------------------------------------------------------- HPI Details Patient Name: Date of Service: [name] Houston RO N J. 03/16/2022 1:00 PM Medical Record Number: [id number] Patient Account Number: [id number] Date of Birth/Sex: Treating RN: 12/01/1955 (66 y.o. F) Primary Care Provider: Harlan Stains Other Clinician: Referring Provider: Treating Provider/Extender: Jake Church in Treatment: 6 History of Present Illness HPI Description: Admission 06/26/2021 [name] is a 66 year old female with a past medical history of uncontrolled type 2 diabetes on oral agents with last hemoglobin A1c of 9, peripheral arterial disease that presents to the clinic for a 3-week history of worsening wound to the posterior aspect of her left leg. She is not sure how it started. She has been using normal saline to the wound bed. She had arterial segmental pressures that showed a left ABI of 0.52 she is scheduled to see vein and vascular on 7/5. She is currently on doxycycline prescribed by her primary care doctor. She currently denies systemic signs of infection. 7/7; patient presents for follow-up. She saw Dr. Donzetta Matters yesterday, vein and vascular to discuss her arterial status. He is  scheduling her for an arteriogram for possible intervention. She has had no issues with the Medihoney. 7/24; patient presents for follow-up. She continues to use Medihoney. She is scheduled to have a stress test on 7/28 in order to be cleared for vascular surgery. Plan is for left common femoral endarterectomy and left common femoral to below the knee popliteal artery bypass. Readmission 01/31/2022 Patient had a left iliofemoral endarterectomy with vein patch angioplasty and common femoral to  above-the-knee popliteal bypass with PTFE by Dr. Donzetta Matters on 12/06/2021. She had follow-up with vein and vascular on 01/18/2022 and it was noted that her left foot is well-perfused with brisk DP and PT Doppler signals. She has follow-up again with vein and vascular at the end of this month for bypass duplex and ABIs. She is currently been using Medihoney to the wound bed. She reports improvement to her chronic pain. She currently denies signs of infection. 10/10; patient presents for follow-up. She has been she has been using Dakin's wet-to-dry dressings however she was having pain on dressing changes. We switched to [facility] in the week. She is done better with this. She has no issues or complaints today. 10/17; patient presents for follow-up. She has been using Santyl and Hydrofera Blue to the wound bed. There is been improvement in wound healing. She has no issues or complaints today. 10/31; patient presents for follow-up. She has been using Santyl and Hydrofera Blue to the wound bed. She has no issues or complaints today. She saw vein and vascular on 10/25 they repeated a TBI on the left which was 0.66. She had a bypass graft duplex that showed biphasic waveforms of throughout the left lower extremity 11/10; patient presents for follow-up. She has recently run out of the [place] and has continued to use Hydrofera Blue to the wound bed. She has no issues or complaints today. 11/16; patient presents for  follow-up. She states she still had a little bit of Santyl left in the tube that she was able to use to the wound bed this past week. She is continued with Hydrofera Blue. She has been using the Tubigrip. She has no issues or complaints today. Electronic Signature(s) Signed: 03/16/2022 5:05:23 PM By: Kalman Shan DO Entered By: Kalman Shan on 03/16/2022 13:45:12 -------------------------------------------------------------------------------- Physical Exam Details Patient Name: Date of Service: [name] Houston RO N J. 03/16/2022 1:00 PM [name] ([id number]) [id number] Page 3 of 9 Medical Record Number: [id number] Patient Account Number: [id number] Date of Birth/Sex: Treating RN: 19-Sep-1955 (66 y.o. F) Primary Care Provider: Harlan Stains Other Clinician: Referring Provider: Treating Provider/Extender: [name] Constitutional respirations regular, non-labored and within target range for patient.. Cardiovascular 2+ dorsalis pedis/posterior tibialis pulses. Psychiatric pleasant and cooperative. Notes Left lower extremity: T the posterior aspect there is an open wound with nonviable tissue and granulation tissue. No surrounding signs of infection. o Electronic Signature(s) Signed: 03/16/2022 5:05:23 PM By: Kalman Shan DO Entered By: Kalman Shan on 03/16/2022 13:45:50 -------------------------------------------------------------------------------- Physician Orders Details Patient Name: Date of Service: [name] Houston RO N J. 03/16/2022 1:00 PM Medical Record Number: [id number] Patient Account Number: [id number] Date of Birth/Sex: Treating RN: 08-05-1955 (66 y.o. [name], [name] Primary Care Provider: Harlan Stains Other Clinician: Referring Provider: Treating Provider/Extender: Jake Church in Treatment: 6 Verbal / Phone Orders: No Diagnosis  Coding Follow-up Appointments ppointment in 2 weeks. - w/ Dr. Heber [place] Return A Anesthetic (In clinic) Topical Lidocaine 5% applied to wound bed Bathing/ Shower/ Hygiene May shower and wash wound with soap and water. - can use antibacterial soap Edema Control - Lymphedema / SCD / Other Elevate legs to the level of the heart or above for 30 minutes daily and/or when sitting, a frequency of: - throughout the day Avoid standing for long periods of time. Other Edema Control Orders/Instructions: - Tubigrip size E apply in the morning and remove at night. Additional Orders / Instructions Follow Nutritious Diet - Monitor/Control  Blood Sugars Home Health New wound care orders this week; continue Home Health for wound care. May utilize formulary equivalent dressing for wound treatment orders unless otherwise specified. - Home health to change twice a week. use medihoney instead of santyl. Dressing changes to be completed by [place] on Monday / Wednesday / Friday except when patient has scheduled visit at [facility]. Other Home Health Orders/Instructions: - [facility] home [facility] Wound Treatment Wound #2 - Lower Leg Wound Laterality: Left, Posterior Cleanser: Soap and Water (Home Health) 1 x Per Day/30 Days Discharge Instructions: May shower and wash wound with dial antibacterial soap and water prior to dressing change. Cleanser: Wound Cleanser (Home Health) 1 x Per Day/30 Days Discharge Instructions: Cleanse the wound with wound cleanser prior to applying a clean dressing using gauze sponges, not tissue or cotton balls. Cleanser: Dakin's Solution 0.5%,16 (oz) [facility]) 1 x Per Day/30 Days MAYRENE, BASTARACHE ([id number]) [id number] Page 4 of 9 Prim Dressing: Hydrofera Blue Ready Foam, 4x5 in (Home Health) 1 x Per Day/30 Days ary Discharge Instructions: Apply to wound bed as instructed Prim Dressing: MediHoney Gel, tube 1.5 (oz) 1 x Per Day/30  Days ary Discharge Instructions: Apply to wound bed under the hydrofera blue. Secondary Dressing: ABD Pad, 5x9 (Home Health) 1 x Per Day/30 Days Discharge Instructions: Apply over primary dressing as directed. Secondary Dressing: Woven Gauze Sponge, Non-Sterile 4x4 in (Home Health) 1 x Per Day/30 Days Discharge Instructions: Apply over primary dressing as directed. Secured With: [employer], 4.5x3.1 (in/yd) (Home Health) 1 x Per Day/30 Days Discharge Instructions: Secure with Kerlix as directed. Secured With: 10M Medipore H Soft Cloth Surgical T ape, 4 x 10 (in/yd) (Home Health) 1 x Per Day/30 Days Discharge Instructions: Secure with tape as directed. Compression Wrap: Tubigrip Size E 1 x Per Day/30 Days Discharge Instructions: apply in the morning and remove at night. Electronic Signature(s) Signed: 03/16/2022 5:05:23 PM By: Kalman Shan DO Entered By: Kalman Shan on 03/16/2022 13:45:58 -------------------------------------------------------------------------------- Problem List Details Patient Name: Date of Service: [name] Houston RO N J. 03/16/2022 1:00 PM Medical Record Number: [id number] Patient Account Number: [id number] Date of Birth/Sex: Treating RN: Aug 09, 1955 (66 y.o. F) Primary Care Provider: Harlan Stains Other Clinician: Referring Provider: Treating Provider/Extender: Jake Church in Treatment: 6 Active Problems ICD-10 Encounter Code Description Active Date MDM Diagnosis I70.248 Atherosclerosis of native arteries of left leg with ulceration of other part of 01/31/2022 No Yes lower leg L97.828 Non-pressure chronic ulcer of other part of left lower leg with other specified 01/31/2022 No Yes severity E11.622 Type 2 diabetes mellitus with other skin ulcer 01/31/2022 No Yes Inactive Problems Resolved Problems Electronic Signature(s) Signed: 03/16/2022 5:05:23 PM By: Kalman Shan DO Entered By: Kalman Shan on 03/16/2022  13:43:35 [name] ([id number]) [id number] Page 5 of 9 -------------------------------------------------------------------------------- Progress Note Details Patient Name: Date of Service: CYRIL, RAILEY 03/16/2022 1:00 PM Medical Record Number: [id number] Patient Account Number: [id number] Date of Birth/Sex: Treating RN: 1955-07-18 (66 y.o. F) Primary Care Provider: Harlan Stains Other Clinician: Referring Provider: Treating Provider/Extender: Jake Church in Treatment: 6 Subjective Chief Complaint Information obtained from Patient 10/20/2021; Left posterior leg wound 01/31/2022; left posterior leg wound History of Present Illness (HPI) Admission 06/26/2021 Ms. Suki Crockett is a 66 year old female with a past medical history of uncontrolled type 2 diabetes on oral agents with last hemoglobin A1c of 9, peripheral arterial disease that presents to the clinic for a 3-week history  of worsening wound to the posterior aspect of her left leg. She is not sure how it started. She has been using normal saline to the wound bed. She had arterial segmental pressures that showed a left ABI of 0.52 she is scheduled to see vein and vascular on 7/5. She is currently on doxycycline prescribed by her primary care doctor. She currently denies systemic signs of infection. 7/7; patient presents for follow-up. She saw Dr. Donzetta Matters yesterday, vein and vascular to discuss her arterial status. He is scheduling her for an arteriogram for possible intervention. She has had no issues with the Medihoney. 7/24; patient presents for follow-up. She continues to use Medihoney. She is scheduled to have a stress test on 7/28 in order to be cleared for vascular surgery. Plan is for left common femoral endarterectomy and left common femoral to below the knee popliteal artery bypass. Readmission 01/31/2022 Patient had a left iliofemoral endarterectomy with vein  patch angioplasty and common femoral to above-the-knee popliteal bypass with PTFE by Dr. Donzetta Matters on 12/06/2021. She had follow-up with vein and vascular on 01/18/2022 and it was noted that her left foot is well-perfused with brisk DP and PT Doppler signals. She has follow-up again with vein and vascular at the end of this month for bypass duplex and ABIs. She is currently been using Medihoney to the wound bed. She reports improvement to her chronic pain. She currently denies signs of infection. 10/10; patient presents for follow-up. She has been she has been using Dakin's wet-to-dry dressings however she was having pain on dressing changes. We switched to [facility] in the week. She is done better with this. She has no issues or complaints today. 10/17; patient presents for follow-up. She has been using Santyl and Hydrofera Blue to the wound bed. There is been improvement in wound healing. She has no issues or complaints today. 10/31; patient presents for follow-up. She has been using Santyl and Hydrofera Blue to the wound bed. She has no issues or complaints today. She saw vein and vascular on 10/25 they repeated a TBI on the left which was 0.66. She had a bypass graft duplex that showed biphasic waveforms of throughout the left lower extremity 11/10; patient presents for follow-up. She has recently run out of the [place] and has continued to use Hydrofera Blue to the wound bed. She has no issues or complaints today. 11/16; patient presents for follow-up. She states she still had a little bit of Santyl left in the tube that she was able to use to the wound bed this past week. She is continued with Hydrofera Blue. She has been using the Tubigrip. She has no issues or complaints today. Patient History Information obtained from Patient, Caregiver. Family History Cancer - Father, Diabetes - Mother,Maternal Grandparents,Father, Hypertension - Mother, No family history of Heart Disease, Hereditary  Spherocytosis, Kidney Disease, Lung Disease, Seizures, Stroke, Thyroid Problems, Tuberculosis. Social History Former smoker, Marital Status - Married, Alcohol Use - Rarely, Drug Use - No History, Caffeine Use - Daily. Medical History Eyes Denies history of Cataracts, Glaucoma, Optic Neuritis Cardiovascular Patient has history of Hypertension, Peripheral Arterial Disease, Peripheral Venous Disease Denies history of Angina, Arrhythmia, Coronary Artery Disease, Deep Vein Thrombosis, Hypotension, Myocardial Infarction, Phlebitis, Vasculitis Gastrointestinal Denies history of Cirrhosis , Colitis, Crohnoos, Hepatitis A, Hepatitis B, Hepatitis C Endocrine Patient has history of Type II Diabetes - A1C-9.1 Denies history of Type I Diabetes Immunological Denies history of Lupus Erythematosus, Raynaudoos, Scleroderma Musculoskeletal Patient has history of Osteoarthritis, Osteomyelitis -  Hx ankle Denies history of Gout, Rheumatoid Arthritis Neurologic Denies history of Neuropathy, Quadriplegia, Paraplegia, Seizure Disorder Hospitalization/Surgery History - left knee. JEREE, DELCID ([id number]) [id number] Page 6 of 9 Medical A Surgical History Notes nd Genitourinary CKD-3 Objective Constitutional respirations regular, non-labored and within target range for patient.. Vitals Time Taken: 12:58 PM, Height: 64 in, Weight: 197 lbs, BMI: 33.8, Temperature: 98.1 F, Pulse: 73 bpm, Respiratory Rate: 17 breaths/min, Blood Pressure: 143/82 mmHg, Capillary Blood Glucose: 88 mg/dl. Cardiovascular 2+ dorsalis pedis/posterior tibialis pulses. Psychiatric pleasant and cooperative. General Notes: Left lower extremity: T the posterior aspect there is an open wound with nonviable tissue and granulation tissue. No surrounding signs of o infection. Integumentary (Hair, Skin) Wound #2 status is Open. Original cause of wound was Gradually Appeared. The date acquired was:  11/21/2021. The wound has been in treatment 6 weeks. The wound is located on the Left,Posterior Lower Leg. The wound measures 5.8cm length x 4.5cm width x 0.1cm depth; 20.499cm^2 area and 2.05cm^3 volume. There is Fat Layer (Subcutaneous Tissue) exposed. There is no tunneling or undermining noted. There is a medium amount of serosanguineous drainage noted. The wound margin is distinct with the outline attached to the wound base. There is large (67-100%) red, pink, hyper - granulation within the wound bed. There is a small (1-33%) amount of necrotic tissue within the wound bed including Adherent Slough. The periwound skin appearance did not exhibit: Callus, Crepitus, Excoriation, Induration, Rash, Scarring, Dry/Scaly, Maceration, Atrophie Blanche, Cyanosis, Ecchymosis, Hemosiderin Staining, Mottled, Pallor, Rubor, Erythema. Periwound temperature was noted as No Abnormality. The periwound has tenderness on palpation. Assessment Active Problems ICD-10 Atherosclerosis of native arteries of left leg with ulceration of other part of lower leg Non-pressure chronic ulcer of other part of left lower leg with other specified severity Type 2 diabetes mellitus with other skin ulcer Patient's wound has shown improvement in size and appearance since last clinic visit. I debrided nonviable tissue and recommended continuing the course with Santyl and Hydrofera Blue. She has Medihoney once she runs out of [place]. She can continue with Tubigrip. We discussed potentially doing a skin substitute and patient would like to proceed with insurance verification for this. We will run organogenesis. Procedures Wound #2 Pre-procedure diagnosis of Wound #2 is an Arterial Insufficiency Ulcer located on the Left,Posterior Lower Leg .Severity of Tissue Pre Debridement is: Fat layer exposed. There was a Excisional Skin/Subcutaneous Tissue Debridement with a total area of 26.1 sq cm performed by Kalman Shan, DO. With  the following instrument(s): Curette to remove Viable and Non-Viable tissue/material. Material removed includes Subcutaneous Tissue and Slough and after achieving pain control using Lidocaine. No specimens were taken. A time out was conducted at 13:30, prior to the start of the procedure. A Minimum amount of bleeding was controlled with Pressure. The procedure was tolerated well with a pain level of 0 throughout and a pain level of 0 following the procedure. Post Debridement Measurements: 5.8cm length x 4.5cm width x 0.1cm depth; 2.05cm^3 volume. Character of Wound/Ulcer Post Debridement is improved. Severity of Tissue Post Debridement is: Fat layer exposed. Post procedure Diagnosis Wound #2: Same as Pre-Procedure Plan Follow-up Appointments: Return Appointment in 2 weeks. - w/ Dr. Heber [place] Anesthetic: [name] ([id number]) [id number] Page 7 of 9 (In clinic) Topical Lidocaine 5% applied to wound bed Bathing/ Shower/ Hygiene: May shower and wash wound with soap and water. - can use antibacterial soap Edema Control - Lymphedema / SCD / Other: Elevate legs to the level of  the heart or above for 30 minutes daily and/or when sitting, a frequency of: - throughout the day Avoid standing for long periods of time. Other Edema Control Orders/Instructions: - Tubigrip size E apply in the morning and remove at night. Additional Orders / Instructions: Follow Nutritious Diet - Monitor/Control Blood Sugars Home Health: New wound care orders this week; continue Home Health for wound care. May utilize formulary equivalent dressing for wound treatment orders unless otherwise specified. - Home health to change twice a week. use medihoney instead of santyl. Dressing changes to be completed by [place] on Monday / Wednesday / Friday except when patient has scheduled visit at [facility]. Other Home Health Orders/Instructions: - [facility] home health WOUND #2: - Lower  Leg Wound Laterality: Left, Posterior Cleanser: Soap and Water (Home Health) 1 x Per Day/30 Days Discharge Instructions: May shower and wash wound with dial antibacterial soap and water prior to dressing change. Cleanser: Wound Cleanser (Home Health) 1 x Per Day/30 Days Discharge Instructions: Cleanse the wound with wound cleanser prior to applying a clean dressing using gauze sponges, not tissue or cotton balls. Cleanser: Dakin's Solution 0.5%,16 (oz) (Home Health) 1 x Per Day/30 Days Prim Dressing: Hydrofera Blue Ready Foam, 4x5 in (Home Health) 1 x Per Day/30 Days ary Discharge Instructions: Apply to wound bed as instructed Prim Dressing: MediHoney Gel, tube 1.5 (oz) 1 x Per Day/30 Days ary Discharge Instructions: Apply to wound bed under the hydrofera blue. Secondary Dressing: ABD Pad, 5x9 (Home Health) 1 x Per Day/30 Days Discharge Instructions: Apply over primary dressing as directed. Secondary Dressing: Woven Gauze Sponge, Non-Sterile 4x4 in (Home Health) 1 x Per Day/30 Days Discharge Instructions: Apply over primary dressing as directed. Secured With: [employer], 4.5x3.1 (in/yd) (Home Health) 1 x Per Day/30 Days Discharge Instructions: Secure with Kerlix as directed. Secured With: 19M Medipore H Soft Cloth Surgical T ape, 4 x 10 (in/yd) (Home Health) 1 x Per Day/30 Days Discharge Instructions: Secure with tape as directed. Com pression Wrap: Tubigrip Size E 1 x Per Day/30 Days Discharge Instructions: apply in the morning and remove at night. 1. In office sharp debridement 2. Hydrofera Blue and Santyl 3. Start Medihoney once [employer] runs out 4. Follow-up in 2 weeks Electronic Signature(s) Signed: 03/16/2022 5:05:23 PM By: Kalman Shan DO Entered By: Kalman Shan on 03/16/2022 13:47:27 -------------------------------------------------------------------------------- HxROS Details Patient Name: Date of Service: [name] Houston RO N J. 03/16/2022 1:00 PM Medical  Record Number: [id number] Patient Account Number: [id number] Date of Birth/Sex: Treating RN: 1956-03-27 (66 y.o. F) Primary Care Provider: Harlan Stains Other Clinician: Referring Provider: Treating Provider/Extender: Jake Church in Treatment: 6 Information Obtained From Patient Caregiver Eyes Medical History: Negative for: Cataracts; Glaucoma; Optic Neuritis Cardiovascular Medical History: Positive for: Hypertension; Peripheral Arterial Disease; Peripheral Venous Disease Negative for: Angina; Arrhythmia; Coronary Artery Disease; Deep Vein Thrombosis; Hypotension; Myocardial Infarction; Phlebitis; Vasculitis Gastrointestinal Medical History: Negative for: Cirrhosis ; Colitis; Crohns; Hepatitis A; Hepatitis B; Hepatitis MARIADELCARMEN, CORELLA ([id number]) [id number] Page 8 of 9 Endocrine Medical History: Positive for: Type II Diabetes - A1C-9.1 Negative for: Type I Diabetes Time with diabetes: 12 years Treated with: Oral agents Blood sugar tested every day: No Genitourinary Medical History: Past Medical History Notes: CKD-3 Immunological Medical History: Negative for: Lupus Erythematosus; Raynauds; Scleroderma Musculoskeletal Medical History: Positive for: Osteoarthritis; Osteomyelitis - Hx ankle Negative for: Gout; Rheumatoid Arthritis Neurologic Medical History: Negative for: Neuropathy; Quadriplegia; Paraplegia; Seizure Disorder Immunizations Pneumococcal Vaccine: Received Pneumococcal Vaccination:  No Implantable Devices None Hospitalization / Surgery History Type of Hospitalization/Surgery left knee Family and Social History Cancer: Yes - Father; Diabetes: Yes - Mother,Maternal Grandparents,Father; Heart Disease: No; Hereditary Spherocytosis: No; Hypertension: Yes - Mother; Kidney Disease: No; Lung Disease: No; Seizures: No; Stroke: No; Thyroid Problems: No; Tuberculosis: No; Former smoker; Marital Status -  Married; Alcohol Use: Rarely; Drug Use: No History; Caffeine Use: Daily; Financial Concerns: No; Food, Clothing or Shelter Needs: No; Support System Lacking: No; Transportation Concerns: No Electronic Signature(s) Signed: 03/16/2022 5:05:23 PM By: Kalman Shan DO Entered By: Kalman Shan on 03/16/2022 13:45:18 -------------------------------------------------------------------------------- SuperBill Details Patient Name: Date of Service: [name] Houston RO N J. 03/16/2022 Medical Record Number: [id number] Patient Account Number: [id number] Date of Birth/Sex: Treating RN: 06/09/1955 (66 y.o. [name], [name] Primary Care Provider: Harlan Stains Other Clinician: Referring Provider: Treating Provider/Extender: [name] Diagnosis Coding ICD-10 Codes Code Description ZEN, FELLING ([id number]) [id number] Page 9 of 9 I70.248 Atherosclerosis of native arteries of left leg with ulceration of other part of lower leg L97.828 Non-pressure chronic ulcer of other part of left lower leg with other specified severity E11.622 Type 2 diabetes mellitus with other skin ulcer Facility Procedures : CPT4 Code: [id number] Description: 15176 - DEB SUBQ TISSUE 20 SQ CM/< ICD-10 Diagnosis Description L97.828 Non-pressure chronic ulcer of other part of left lower leg with other specified Modifier: severity Quantity: 1 : CPT4 Code: 16073710 Description: 62694 - DEB SUBQ TISS EA ADDL 20CM ICD-10 Diagnosis Description L97.828 Non-pressure chronic ulcer of other part of left lower leg with other specified Modifier: severity Quantity: 1 Physician Procedures : CPT4 Code Description Modifier [id number] 11042 - WC PHYS SUBQ TISS 20 SQ CM ICD-10 Diagnosis Description L97.828 Non-pressure chronic ulcer of other part of left lower leg with other specified severity Quantity: 1 : [id number] 18299 - WC PHYS SUBQ TISS EA ADDL 20 CM ICD-10  Diagnosis Description L97.828 Non-pressure chronic ulcer of other part of left lower leg with other specified severity Quantity: 1 Electronic Signature(s) Signed: 03/16/2022 5:05:23 PM By: Kalman Shan DO Entered By: Kalman Shan on 03/16/2022 13:47:39

## 2022-03-30 ENCOUNTER — Encounter (HOSPITAL_BASED_OUTPATIENT_CLINIC_OR_DEPARTMENT_OTHER): Payer: HMO | Admitting: Internal Medicine

## 2022-03-30 DIAGNOSIS — L97828 Non-pressure chronic ulcer of other part of left lower leg with other specified severity: Secondary | ICD-10-CM | POA: Diagnosis not present

## 2022-03-30 DIAGNOSIS — E11622 Type 2 diabetes mellitus with other skin ulcer: Secondary | ICD-10-CM | POA: Diagnosis not present

## 2022-03-31 NOTE — Progress Notes (Signed)
SHERREL, PLOCH ([id number]) [id number] Page 1 of 9 Visit Report for 03/30/2022 Chief Complaint Document Details Patient Name: Date of Service: [name] Houston, [name] Houston 03/30/2022 1:30 PM Medical Record Number: [id number] Patient Account Number: [id number] Date of Birth/Sex: Treating RN: [birth year] (66 y.o. F) Primary Care Provider: Harlan Stains Other Clinician: Referring Provider: Treating Provider/Extender: Jake Church in Treatment: 8 Information Obtained from: Patient Chief Complaint 10/20/2021; Left posterior leg wound 01/31/2022; left posterior leg wound Electronic Signature(s) Signed: 03/30/2022 4:36:48 PM By: Kalman Shan DO Entered By: Kalman Shan on 03/30/2022 15:50:13 -------------------------------------------------------------------------------- Debridement Details Patient Name: Date of Service: [name] Houston. 03/30/2022 1:30 PM Medical Record Number: [id number] Patient Account Number: [id number] Date of Birth/Sex: Treating RN: 13-Oct-1955 (66 y.o. [name], [name] Primary Care Provider: Harlan Stains Other Clinician: Referring Provider: Treating Provider/Extender: [name] Weeks in Treatment: 8 Debridement Performed for Assessment: Wound #2 Left,Posterior Lower Leg Performed By: Physician Kalman Shan, DO Debridement Type: Debridement Severity of Tissue Pre Debridement: Fat layer exposed Level of Consciousness (Pre-procedure): Awake and Alert Pre-procedure Verification/Time Out Yes - 14:06 Taken: Start Time: 14:06 Pain Control: Lidocaine T Area Debrided (L x W): otal 5.1 (cm) x 4 (cm) = 20.4 (cm) Tissue and other material debrided: Viable, Non-Viable, Slough, Subcutaneous, Slough Level: Skin/Subcutaneous Tissue Debridement Description: Excisional Instrument: Curette Bleeding: Minimum Hemostasis Achieved: Pressure End Time: 14:06 Procedural Pain: 0 Post  Procedural Pain: 0 Response to Treatment: Procedure was tolerated well Level of Consciousness (Post- Awake and Alert procedure): Post Debridement Measurements of Total Wound Length: (cm) 5.1 Width: (cm) 4 Depth: (cm) 0.1 Volume: (cm) 1.602 Character of Wound/Ulcer Post Debridement: Improved [name] Houston, [name] Houston ([id number]) [id number] Page 2 of 9 Severity of Tissue Post Debridement: Fat layer exposed Post Procedure Diagnosis Same as Pre-procedure Electronic Signature(s) Signed: 03/30/2022 4:36:48 PM By: Kalman Shan DO Signed: 03/30/2022 5:26:06 PM By: Rhae Hammock RN Entered By: Rhae Hammock on 03/30/2022 14:07:33 -------------------------------------------------------------------------------- HPI Details Patient Name: Date of Service: [name] Houston. 03/30/2022 1:30 PM Medical Record Number: [id number] Patient Account Number: [id number] Date of Birth/Sex: Treating RN: March 18, 1956 (66 y.o. F) Primary Care Provider: Harlan Stains Other Clinician: Referring Provider: Treating Provider/Extender: Jake Church in Treatment: 8 History of Present Illness HPI Description: Admission 06/26/2021 [name] is a 66 year old female with a past medical history of uncontrolled type 2 diabetes on oral agents with last hemoglobin A1c of 9, peripheral arterial disease that presents to the clinic for a 3-week history of worsening wound to the posterior aspect of her left leg. She is not sure how it started. She has been using normal saline to the wound bed. She had arterial segmental pressures that showed a left ABI of 0.52 she is scheduled to see vein and vascular on 7/5. She is currently on doxycycline prescribed by her primary care doctor. She currently denies systemic signs of infection. 7/7; patient presents for follow-up. She saw Dr. Donzetta Matters yesterday, vein and vascular to discuss her arterial status. He is scheduling  her for an arteriogram for possible intervention. She has had no issues with the Medihoney. 7/24; patient presents for follow-up. She continues to use Medihoney. She is scheduled to have a stress test on 7/28 in order to be cleared for vascular surgery. Plan is for left common femoral endarterectomy and left common femoral to below the knee popliteal artery bypass. Readmission 01/31/2022 Patient had a left iliofemoral endarterectomy with vein patch angioplasty and common femoral to  above-the-knee popliteal bypass with PTFE by Dr. Donzetta Matters on 12/06/2021. She had follow-up with vein and vascular on 01/18/2022 and it was noted that her left foot is well-perfused with brisk DP and PT Doppler signals. She has follow-up again with vein and vascular at the end of this month for bypass duplex and ABIs. She is currently been using Medihoney to the wound bed. She reports improvement to her chronic pain. She currently denies signs of infection. 10/10; patient presents for follow-up. She has been she has been using Dakin's wet-to-dry dressings however she was having pain on dressing changes. We switched to [facility] in the week. She is done better with this. She has no issues or complaints today. 10/17; patient presents for follow-up. She has been using Santyl and Hydrofera Blue to the wound bed. There is been improvement in wound healing. She has no issues or complaints today. 10/31; patient presents for follow-up. She has been using Santyl and Hydrofera Blue to the wound bed. She has no issues or complaints today. She saw vein and vascular on 10/25 they repeated a TBI on the left which was 0.66. She had a bypass graft duplex that showed biphasic waveforms of throughout the left lower extremity 11/10; patient presents for follow-up. She has recently run out of the [place] [facility] and has continued to use Hydrofera Blue to the wound bed. She has no issues or complaints today. 11/16; patient presents for follow-up. She  states she still had a little bit of Santyl left in the tube that she was able to use to the wound bed this past week. She is continued with Hydrofera Blue. She has been using the Tubigrip. She has no issues or complaints today. 11/30; patient presents for follow-up. She states she continues to have a little bit of Santyl left and has been using this with Hydrofera Blue. She knows to use Medihoney once this runs out. She has been using Tubigrip. She has no issues or complaints today. Electronic Signature(s) Signed: 03/30/2022 4:36:48 PM By: Kalman Shan DO Entered By: Kalman Shan on 03/30/2022 15:50:44 [name] ([id number]) [id number] Page 3 of 9 -------------------------------------------------------------------------------- Physical Exam Details Patient Name: Date of Service: [name] Houston, [name] Houston 03/30/2022 1:30 PM Medical Record Number: [id number] Patient Account Number: [id number] Date of Birth/Sex: Treating RN: [birth year] (66 y.o. F) Primary Care Provider: Harlan Stains Other Clinician: Referring Provider: Treating Provider/Extender: [name] Weeks in Treatment: 8 Constitutional respirations regular, non-labored and within target range for patient.. Cardiovascular 2+ dorsalis pedis/posterior tibialis pulses. Psychiatric pleasant and cooperative. Notes Left lower extremity: T the posterior aspect there is an open wound with nonviable tissue and granulation tissue. No surrounding signs of infection. o Electronic Signature(s) Signed: 03/30/2022 4:36:48 PM By: Kalman Shan DO Entered By: Kalman Shan on 03/30/2022 15:51:11 -------------------------------------------------------------------------------- Physician Orders Details Patient Name: Date of Service: [name] Houston. 03/30/2022 1:30 PM Medical Record Number: [id number] Patient Account Number: [id number] Date of Birth/Sex: Treating  RN: 02-01-1956 (66 y.o. [name] Primary Care Provider: Harlan Stains Other Clinician: Referring Provider: Treating Provider/Extender: Jake Church in Treatment: 8 Verbal / Phone Orders: No Diagnosis Coding Follow-up Appointments ppointment in 2 weeks. - w/ Dr. Heber Huntleigh Return A Anesthetic (In clinic) Topical Lidocaine 5% applied to wound bed Bathing/ Shower/ Hygiene May shower and wash wound with soap and water. - can use antibacterial soap Edema Control - Lymphedema / SCD / Other Elevate legs to the level of the heart or above  for 30 minutes daily and/or when sitting, a frequency of: - throughout the day Avoid standing for long periods of time. Other Edema Control Orders/Instructions: - Tubigrip size E apply in the morning and remove at night. Additional Orders / Instructions Follow Nutritious Diet - Monitor/Control Blood Sugars Home Health New wound care orders this week; continue Home Health for wound care. May utilize formulary equivalent dressing for wound treatment orders unless otherwise specified. - Home health to change twice a week. use medihoney instead of santyl. Dressing changes to be completed by [place] on Monday / Wednesday / Friday except when patient has scheduled visit at [facility]. Other Home Health Orders/Instructions: - [facility] home health Wound Treatment Wound #2 - Lower Leg Wound Laterality: Left, Posterior Cleanser: Soap and Water (Home Health) 1 x Per Day/30 Days Discharge Instructions: May shower and wash wound with dial antibacterial soap and water prior to dressing change. Cleanser: Wound Cleanser [facility]) 1 x Per Day/30 Days ROBENA, EWY ([id number]) [id number].pdf Page 4 of 9 Discharge Instructions: Cleanse the wound with wound cleanser prior to applying a clean dressing using gauze sponges, not tissue or cotton balls. Cleanser: Dakin's Solution 0.5%,16 (oz) (Home  Health) 1 x Per Day/30 Days Prim Dressing: MediHoney Gel, tube 1.5 (oz) 1 x Per Day/30 Days ary Discharge Instructions: Apply to wound bed under the hydrofera blue. Prim Dressing: Hydrofera Blue Ready Foam, 4x5 in (Home Health) 1 x Per Day/30 Days ary Discharge Instructions: Apply to wound bed as instructed Secondary Dressing: ABD Pad, 5x9 (Home Health) 1 x Per Day/30 Days Discharge Instructions: Apply over primary dressing as directed. Secondary Dressing: Woven Gauze Sponge, Non-Sterile 4x4 in (Home Health) 1 x Per Day/30 Days Discharge Instructions: Apply over primary dressing as directed. Secured With: [employer], 4.5x3.1 (in/yd) (Home Health) 1 x Per Day/30 Days Discharge Instructions: Secure with Kerlix as directed. Secured With: 26M Medipore H Soft Cloth Surgical T ape, 4 x 10 (in/yd) (Home Health) 1 x Per Day/30 Days Discharge Instructions: Secure with tape as directed. Compression Wrap: Tubigrip Size E 1 x Per Day/30 Days Discharge Instructions: apply in the morning and remove at night. Electronic Signature(s) Signed: 03/30/2022 4:36:48 PM By: Kalman Shan DO Entered By: Kalman Shan on 03/30/2022 15:51:19 -------------------------------------------------------------------------------- Problem List Details Patient Name: Date of Service: [name] Houston. 03/30/2022 1:30 PM Medical Record Number: [id number] Patient Account Number: [id number] Date of Birth/Sex: Treating RN: 05/02/1955 (66 y.o. F) Primary Care Provider: Harlan Stains Other Clinician: Referring Provider: Treating Provider/Extender: Jake Church in Treatment: 8 Active Problems ICD-10 Encounter Code Description Active Date MDM Diagnosis I70.248 Atherosclerosis of native arteries of left leg with ulceration of other part of 01/31/2022 No Yes lower leg L97.828 Non-pressure chronic ulcer of other part of left lower leg with other specified 01/31/2022 No  Yes severity E11.622 Type 2 diabetes mellitus with other skin ulcer 01/31/2022 No Yes Inactive Problems Resolved Problems Electronic Signature(s) Signed: 03/30/2022 4:36:48 PM By: Cyndi Bender, [name] ([id number]) [id number] Page 5 of 9 Entered By: Kalman Shan on 03/30/2022 15:49:53 -------------------------------------------------------------------------------- Progress Note Details Patient Name: Date of Service: [name] Houston, [name] Houston 03/30/2022 1:30 PM Medical Record Number: [id number] Patient Account Number: [id number] Date of Birth/Sex: Treating RN: 08-Jul-1955 (66 y.o. F) Primary Care Provider: Harlan Stains Other Clinician: Referring Provider: Treating Provider/Extender: Jake Church in Treatment: 8 Subjective Chief Complaint Information obtained from Patient 10/20/2021; Left posterior leg wound 01/31/2022; left posterior leg  wound History of Present Illness (HPI) Admission 06/26/2021 Ms. Obie Silos is a 66 year old female with a past medical history of uncontrolled type 2 diabetes on oral agents with last hemoglobin A1c of 9, peripheral arterial disease that presents to the clinic for a 3-week history of worsening wound to the posterior aspect of her left leg. She is not sure how it started. She has been using normal saline to the wound bed. She had arterial segmental pressures that showed a left ABI of 0.52 she is scheduled to see vein and vascular on 7/5. She is currently on doxycycline prescribed by her primary care doctor. She currently denies systemic signs of infection. 7/7; patient presents for follow-up. She saw Dr. Donzetta Matters yesterday, vein and vascular to discuss her arterial status. He is scheduling her for an arteriogram for possible intervention. She has had no issues with the Medihoney. 7/24; patient presents for follow-up. She continues to use Medihoney. She is scheduled to have a stress test  on 7/28 in order to be cleared for vascular surgery. Plan is for left common femoral endarterectomy and left common femoral to below the knee popliteal artery bypass. Readmission 01/31/2022 Patient had a left iliofemoral endarterectomy with vein patch angioplasty and common femoral to above-the-knee popliteal bypass with PTFE by Dr. Donzetta Matters on 12/06/2021. She had follow-up with vein and vascular on 01/18/2022 and it was noted that her left foot is well-perfused with brisk DP and PT Doppler signals. She has follow-up again with vein and vascular at the end of this month for bypass duplex and ABIs. She is currently been using Medihoney to the wound bed. She reports improvement to her chronic pain. She currently denies signs of infection. 10/10; patient presents for follow-up. She has been she has been using Dakin's wet-to-dry dressings however she was having pain on dressing changes. We switched to [facility] in the week. She is done better with this. She has no issues or complaints today. 10/17; patient presents for follow-up. She has been using Santyl and Hydrofera Blue to the wound bed. There is been improvement in wound healing. She has no issues or complaints today. 10/31; patient presents for follow-up. She has been using Santyl and Hydrofera Blue to the wound bed. She has no issues or complaints today. She saw vein and vascular on 10/25 they repeated a TBI on the left which was 0.66. She had a bypass graft duplex that showed biphasic waveforms of throughout the left lower extremity 11/10; patient presents for follow-up. She has recently run out of the [place] and has continued to use Hydrofera Blue to the wound bed. She has no issues or complaints today. 11/16; patient presents for follow-up. She states she still had a little bit of Santyl left in the tube that she was able to use to the wound bed this past week. She is continued with Hydrofera Blue. She has been using the Tubigrip. She has no  issues or complaints today. 11/30; patient presents for follow-up. She states she continues to have a little bit of Santyl left and has been using this with Hydrofera Blue. She knows to use Medihoney once this runs out. She has been using Tubigrip. She has no issues or complaints today. Patient History Information obtained from Patient, Caregiver. Family History Cancer - Father, Diabetes - Mother,Maternal Grandparents,Father, Hypertension - Mother, No family history of Heart Disease, Hereditary Spherocytosis, Kidney Disease, Lung Disease, Seizures, Stroke, Thyroid Problems, Tuberculosis. Social History Former smoker, Marital Status - Married, Alcohol Use - Rarely,  Drug Use - No History, Caffeine Use - Daily. Medical History Eyes Denies history of Cataracts, Glaucoma, Optic Neuritis Cardiovascular Patient has history of Hypertension, Peripheral Arterial Disease, Peripheral Venous Disease Denies history of Angina, Arrhythmia, Coronary Artery Disease, Deep Vein Thrombosis, Hypotension, Myocardial Infarction, Phlebitis, Vasculitis Gastrointestinal Denies history of Cirrhosis , Colitis, Crohnoos, Hepatitis A, Hepatitis B, Hepatitis C Endocrine Patient has history of Type II Diabetes - A1C-9.1 Denies history of Type I Diabetes Immunological Denies history of Lupus Erythematosus, Raynaudoos, Scleroderma [name] Houston, [name] Houston ([id number]) [id number] Page 6 of 9 Musculoskeletal Patient has history of Osteoarthritis, Osteomyelitis - Hx ankle Denies history of Gout, Rheumatoid Arthritis Neurologic Denies history of Neuropathy, Quadriplegia, Paraplegia, Seizure Disorder Hospitalization/Surgery History - left knee. Medical A Surgical History Notes nd Genitourinary CKD-3 Objective Constitutional respirations regular, non-labored and within target range for patient.. Vitals Time Taken: 1:25 PM, Height: 64 in, Weight: 197 lbs, BMI: 33.8, Temperature: 97.9 F, Pulse:  70 bpm, Respiratory Rate: 20 breaths/min, Blood Pressure: 145/63 mmHg, Capillary Blood Glucose: 95 mg/dl. Cardiovascular 2+ dorsalis pedis/posterior tibialis pulses. Psychiatric pleasant and cooperative. General Notes: Left lower extremity: T the posterior aspect there is an open wound with nonviable tissue and granulation tissue. No surrounding signs of o infection. Integumentary (Hair, Skin) Wound #2 status is Open. Original cause of wound was Gradually Appeared. The date acquired was: 11/21/2021. The wound has been in treatment 8 weeks. The wound is located on the Left,Posterior Lower Leg. The wound measures 5.1cm length x 4cm width x 0.1cm depth; 16.022cm^2 area and 1.602cm^3 volume. There is Fat Layer (Subcutaneous Tissue) exposed. There is no tunneling noted. There is a medium amount of serosanguineous drainage noted. The wound margin is distinct with the outline attached to the wound base. There is large (67-100%) red, pink, hyper - granulation within the wound bed. There is a small (1- 33%) amount of necrotic tissue within the wound bed including Adherent Slough. The periwound skin appearance did not exhibit: Callus, Crepitus, Excoriation, Induration, Rash, Scarring, Dry/Scaly, Maceration, Atrophie Blanche, Cyanosis, Ecchymosis, Hemosiderin Staining, Mottled, Pallor, Rubor, Erythema. Periwound temperature was noted as No Abnormality. The periwound has tenderness on palpation. Assessment Active Problems ICD-10 Atherosclerosis of native arteries of left leg with ulceration of other part of lower leg Non-pressure chronic ulcer of other part of left lower leg with other specified severity Type 2 diabetes mellitus with other skin ulcer Patient's wound has shown improvement in size) since last clinic visit. I debrided nonviable tissue. I recommended continuing the course with Santyl and Hydrofera Blue under Tubigrip. Follow-up in 2 weeks. Procedures Wound #2 Pre-procedure diagnosis of  Wound #2 is an Arterial Insufficiency Ulcer located on the Left,Posterior Lower Leg .Severity of Tissue Pre Debridement is: Fat layer exposed. There was a Excisional Skin/Subcutaneous Tissue Debridement with a total area of 20.4 sq cm performed by Kalman Shan, DO. With the following instrument(s): Curette to remove Viable and Non-Viable tissue/material. Material removed includes Subcutaneous Tissue and Slough and after achieving pain control using Lidocaine. No specimens were taken. A time out was conducted at 14:06, prior to the start of the procedure. A Minimum amount of bleeding was controlled with Pressure. The procedure was tolerated well with a pain level of 0 throughout and a pain level of 0 following the procedure. Post Debridement Measurements: 5.1cm length x 4cm width x 0.1cm depth; 1.602cm^3 volume. Character of Wound/Ulcer Post Debridement is improved. Severity of Tissue Post Debridement is: Fat layer exposed. Post procedure Diagnosis Wound #2: Same as Pre-Procedure [name] Houston, [name]  Houston ([id number]) [id number] Page 7 of 9 Plan Follow-up Appointments: Return Appointment in 2 weeks. - w/ Dr. Heber [place] Anesthetic: (In clinic) Topical Lidocaine 5% applied to wound bed Bathing/ Shower/ Hygiene: May shower and wash wound with soap and water. - can use antibacterial soap Edema Control - Lymphedema / SCD / Other: Elevate legs to the level of the heart or above for 30 minutes daily and/or when sitting, a frequency of: - throughout the day Avoid standing for long periods of time. Other Edema Control Orders/Instructions: - Tubigrip size E apply in the morning and remove at night. Additional Orders / Instructions: Follow Nutritious Diet - Monitor/Control Blood Sugars Home Health: New wound care orders this week; continue Home Health for wound care. May utilize formulary equivalent dressing for wound treatment orders unless otherwise specified. - Home health to  change twice a week. use medihoney instead of santyl. Dressing changes to be completed by [place] on Monday / Wednesday / Friday except when patient has scheduled visit at [facility]. Other Home Health Orders/Instructions: - [facility] home health WOUND #2: - Lower Leg Wound Laterality: Left, Posterior Cleanser: Soap and Water (Home Health) 1 x Per Day/30 Days Discharge Instructions: May shower and wash wound with dial antibacterial soap and water prior to dressing change. Cleanser: Wound Cleanser (Home Health) 1 x Per Day/30 Days Discharge Instructions: Cleanse the wound with wound cleanser prior to applying a clean dressing using gauze sponges, not tissue or cotton balls. Cleanser: Dakin's Solution 0.5%,16 (oz) (Home Health) 1 x Per Day/30 Days Prim Dressing: MediHoney Gel, tube 1.5 (oz) 1 x Per Day/30 Days ary Discharge Instructions: Apply to wound bed under the hydrofera blue. Prim Dressing: Hydrofera Blue Ready Foam, 4x5 in (Home Health) 1 x Per Day/30 Days ary Discharge Instructions: Apply to wound bed as instructed Secondary Dressing: ABD Pad, 5x9 (Home Health) 1 x Per Day/30 Days Discharge Instructions: Apply over primary dressing as directed. Secondary Dressing: Woven Gauze Sponge, Non-Sterile 4x4 in (Home Health) 1 x Per Day/30 Days Discharge Instructions: Apply over primary dressing as directed. Secured With: [employer], 4.5x3.1 (in/yd) (Home Health) 1 x Per Day/30 Days Discharge Instructions: Secure with Kerlix as directed. Secured With: 38M Medipore H Soft Cloth Surgical T ape, 4 x 10 (in/yd) (Home Health) 1 x Per Day/30 Days Discharge Instructions: Secure with tape as directed. Com pression Wrap: Tubigrip Size E 1 x Per Day/30 Days Discharge Instructions: apply in the morning and remove at night. 1. In office sharp debridement 2. Santyl and Hydrofera Blue under Tubigrip 3. Start Medihoney with Hydrofera Blue once Santyl runs out 4. Follow-up in 2  weeks Electronic Signature(s) Signed: 03/30/2022 4:36:48 PM By: Kalman Shan DO Entered By: Kalman Shan on 03/30/2022 15:52:07 -------------------------------------------------------------------------------- HxROS Details Patient Name: Date of Service: [name] Houston. 03/30/2022 1:30 PM Medical Record Number: [id number] Patient Account Number: [id number] Date of Birth/Sex: Treating RN: 23-Mar-1956 (66 y.o. F) Primary Care Provider: Harlan Stains Other Clinician: Referring Provider: Treating Provider/Extender: Jake Church in Treatment: 8 Information Obtained From Patient Caregiver Eyes Medical History: Negative for: Cataracts; Glaucoma; Optic Neuritis Cardiovascular Medical History: Positive for: Hypertension; Peripheral Arterial Disease; Peripheral Venous Disease Negative for: Angina; Arrhythmia; Coronary Artery Disease; Deep Vein Thrombosis; Hypotension; Myocardial Infarction; Phlebitis; Vasculitis [name] Houston, [name] Houston ([id number]) [phone] Page 8 of 9 Gastrointestinal Medical History: Negative for: Cirrhosis ; Colitis; Crohns; Hepatitis A; Hepatitis B; Hepatitis C Endocrine Medical History: Positive for: Type II Diabetes - A1C-9.1 Negative for: Type  I Diabetes Time with diabetes: 12 years Treated with: Oral agents Blood sugar tested every day: No Genitourinary Medical History: Past Medical History Notes: CKD-3 Immunological Medical History: Negative for: Lupus Erythematosus; Raynauds; Scleroderma Musculoskeletal Medical History: Positive for: Osteoarthritis; Osteomyelitis - Hx ankle Negative for: Gout; Rheumatoid Arthritis Neurologic Medical History: Negative for: Neuropathy; Quadriplegia; Paraplegia; Seizure Disorder Immunizations Pneumococcal Vaccine: Received Pneumococcal Vaccination: No Implantable Devices None Hospitalization / Surgery History Type of Hospitalization/Surgery left  knee Family and Social History Cancer: Yes - Father; Diabetes: Yes - Mother,Maternal Grandparents,Father; Heart Disease: No; Hereditary Spherocytosis: No; Hypertension: Yes - Mother; Kidney Disease: No; Lung Disease: No; Seizures: No; Stroke: No; Thyroid Problems: No; Tuberculosis: No; Former smoker; Marital Status - Married; Alcohol Use: Rarely; Drug Use: No History; Caffeine Use: Daily; Financial Concerns: No; Food, Clothing or Shelter Needs: No; Support System Lacking: No; Transportation Concerns: No Electronic Signature(s) Signed: 03/30/2022 4:36:48 PM By: Kalman Shan DO Entered By: Kalman Shan on 03/30/2022 15:50:50 -------------------------------------------------------------------------------- SuperBill Details Patient Name: Date of Service: [name] Cavalier RO [name] Cline. 03/30/2022 Medical Record Number: [id number] Patient Account Number: [id number] Date of Birth/Sex: Treating RN: Nov 09, 1955 (66 y.o. F) Primary Care Provider: Harlan Stains Other Clinician: Referring Provider: Treating Provider/Extender: Jake Church in Treatment: [address] [place] ([id number]) [id number] Page 9 of 9 Diagnosis Coding ICD-10 Codes Code Description I70.248 Atherosclerosis of native arteries of left leg with ulceration of other part of lower leg L97.828 Non-pressure chronic ulcer of other part of left lower leg with other specified severity E11.622 Type 2 diabetes mellitus with other skin ulcer Facility Procedures : CPT4 Code: 89169450 Description: 38882 - DEB SUBQ TISSUE 20 SQ CM/< ICD-10 Diagnosis Description L97.828 Non-pressure chronic ulcer of other part of left lower leg with other specified E11.622 Type 2 diabetes mellitus with other skin ulcer Modifier: severity Quantity: 1 : CPT4 Code: 80034917 Description: 91505 - DEB SUBQ TISS EA ADDL 20CM ICD-10 Diagnosis Description L97.828 Non-pressure chronic ulcer of other part of left  lower leg with other specified E11.622 Type 2 diabetes mellitus with other skin ulcer Modifier: severity Quantity: 1 Physician Procedures : CPT4 Code Description Modifier [id number] 11042 - WC PHYS SUBQ TISS 20 SQ CM ICD-10 Diagnosis Description L97.828 Non-pressure chronic ulcer of other part of left lower leg with other specified severity E11.622 Type 2 diabetes mellitus with other skin  ulcer Quantity: 1 : [id number] 11045 - WC PHYS SUBQ TISS EA ADDL 20 CM ICD-10 Diagnosis Description L97.828 Non-pressure chronic ulcer of other part of left lower leg with other specified severity E11.622 Type 2 diabetes mellitus with other skin ulcer Quantity: 1 Electronic Signature(s) Signed: 03/30/2022 4:36:48 PM By: Kalman Shan DO Entered By: Kalman Shan on 03/30/2022 15:52:33

## 2022-03-31 NOTE — Progress Notes (Signed)
DYMON, SUMMER[place]ILL ([id number]) [id number] Page 1 of 8 Visit Report for 03/30/2022 Arrival Information Details Patient Name: Date of Service: [name] [place], [name] Houston 03/30/2022 1:30 PM Medical Record Number: [id number] Patient Account Number: [id number] Date of Birth/Sex: Treating RN: [birth year] (66 y.o. [name] [place], [name] Houston Primary Care Ayo Guarino: Harlan Stains Other Clinician: Referring Harwood Nall: Treating Amy Belloso/Extender: [name] [place] in Treatment: 8 Visit Information History Since Last Visit Added or deleted any medications: No Patient Arrived: [name] Any new allergies or adverse reactions: No Arrival Time: 13:26 Had a fall or experienced change in No Accompanied By: family member activities of daily living that may affect Transfer Assistance: None risk of falls: Patient Identification Verified: Yes Signs or symptoms of abuse/neglect since last visito No Secondary Verification Process Completed: Yes Hospitalized since last visit: No Patient Requires Transmission-Based Precautions: No Implantable device outside of the clinic excluding No Patient Has Alerts: No cellular tissue based products placed in the center since last visit: Has Dressing in Place as Prescribed: Yes Has Compression in Place as Prescribed: Yes Pain Present Now: Yes Electronic Signature(s) Signed: 03/30/2022 6:11:43 PM By: Deon Pilling RN, BSN Entered By: Deon Pilling on 03/30/2022 13:26:46 -------------------------------------------------------------------------------- Encounter Discharge Information Details Patient Name: Date of Service: [name] Cavalier RO N J. 03/30/2022 1:30 PM Medical Record Number: [id number] Patient Account Number: [id number] Date of Birth/Sex: Treating RN: 1956/02/18 (66 y.o. [name] Houston, [name] Houston Primary Care Jaymen Fetch: Harlan Stains Other Clinician: Referring Mykeal Carrick: Treating [name] Houston/Extender: [name] Houston in Treatment: 8 Encounter Discharge Information Items Post Procedure Vitals Discharge Condition: Stable Temperature (F): 98.7 Ambulatory Status: Ambulatory Pulse (bpm): 74 Discharge Destination: Home Respiratory Rate (breaths/min): 17 Transportation: Private Auto Blood Pressure (mmHg): 120/80 Accompanied By: husband Schedule Follow-up Appointment: Yes Clinical Summary of Care: Patient Declined Electronic Signature(s) Signed: 03/30/2022 5:26:06 PM By: Rhae Hammock RN Entered By: Rhae Hammock on 03/30/2022 16:32:21 [name] ([id number]) [id number] Page 2 of 8 -------------------------------------------------------------------------------- Lower Extremity Assessment Details Patient Name: Date of Service: [name] Houston, [name] Houston 03/30/2022 1:30 PM Medical Record Number: [id number] Patient Account Number: [id number] Date of Birth/Sex: Treating RN: Jul 15, 1955 (66 y.o. [name] Primary Care Syrai Gladwin: Harlan Stains Other Clinician: Referring Craven Crean: Treating Adeli Frost/Extender: [name] Houston Weeks in Treatment: 8 Edema Assessment Assessed: [Left: Yes] [Right: No] Edema: [Left: N] [Right: o] Calf Left: Right: Point of Measurement: 29 cm From Medial Instep 39 cm Ankle Left: Right: Point of Measurement: 7 cm From Medial Instep 23 cm Vascular Assessment Pulses: Dorsalis Pedis Palpable: [Left:Yes] Electronic Signature(s) Signed: 03/30/2022 6:11:43 PM By: Deon Pilling RN, BSN Entered By: Deon Pilling on 03/30/2022 13:30:19 -------------------------------------------------------------------------------- Multi Wound Chart Details Patient Name: Date of Service: [name] Cavalier RO N J. 03/30/2022 1:30 PM Medical Record Number: [id number] Patient Account Number: [id number] Date of Birth/Sex: Treating RN: [birth year] (66 y.o. F) Primary Care Harutyun Monteverde: Harlan Stains Other Clinician: Referring  Oriya Kettering: Treating Joeann Steppe/Extender: [name] Houston Weeks in Treatment: 8 Vital Signs Height(in): 72 Capillary Blood Glucose(mg/dl): 95 Weight(lbs): 197 Pulse(bpm): 24 Body Mass Index(BMI): 33.8 Blood Pressure(mmHg): 145/63 Temperature(F): 97.9 Respiratory Rate(breaths/min): 20 [2:Photos:] [N/A:N/A] Left, Posterior Lower Leg N/A N/A Wound Location: Gradually Appeared N/A N/A Wounding Event: Arterial Insufficiency Ulcer N/A N/A Primary Etiology: Hypertension, Peripheral Arterial N/A N/A Comorbid History: Disease, Peripheral Venous Disease, Type II Diabetes, Osteoarthritis, Osteomyelitis 11/21/2021 N/A N/A Date Acquired: 8 N/A N/A Weeks of Treatment: Open N/A N/A Wound Status: No N/A N/A Wound Recurrence: 5.1x4x0.1 N/A N/A Measurements L x W x D (  cm) 16.022 N/A N/A A (cm) : rea 1.602 N/A N/A Volume (cm) : 54.70% N/A N/A % Reduction in A rea: 77.30% N/A N/A % Reduction in Volume: Full Thickness Without Exposed N/A N/A Classification: Support Structures Medium N/A N/A Exudate A mount: Serosanguineous N/A N/A Exudate Type: red, brown N/A N/A Exudate Color: Distinct, outline attached N/A N/A Wound Margin: Large (67-100%) N/A N/A Granulation A mount: Red, Pink, Hyper-granulation N/A N/A Granulation Quality: Small (1-33%) N/A N/A Necrotic A mount: Fat Layer (Subcutaneous Tissue): Yes N/A N/A Exposed Structures: Fascia: No Tendon: No Muscle: No Joint: No Bone: No Medium (34-66%) N/A N/A Epithelialization: Debridement - Excisional N/A N/A Debridement: Pre-procedure Verification/Time Out 14:06 N/A N/A Taken: Lidocaine N/A N/A Pain Control: Subcutaneous, Slough N/A N/A Tissue Debrided: Skin/Subcutaneous Tissue N/A N/A Level: 20.4 N/A N/A Debridement A (sq cm): rea Curette N/A N/A Instrument: Minimum N/A N/A Bleeding: Pressure N/A N/A Hemostasis A chieved: 0 N/A N/A Procedural Pain: 0 N/A N/A Post Procedural  Pain: Procedure was tolerated well N/A N/A Debridement Treatment Response: 5.1x4x0.1 N/A N/A Post Debridement Measurements L x W x D (cm) 1.602 N/A N/A Post Debridement Volume: (cm) Excoriation: No N/A N/A Periwound Skin Texture: Induration: No Callus: No Crepitus: No Rash: No Scarring: No Maceration: No N/A N/A Periwound Skin Moisture: Dry/Scaly: No Atrophie Blanche: No N/A N/A Periwound Skin Color: Cyanosis: No Ecchymosis: No Erythema: No Hemosiderin Staining: No Mottled: No Pallor: No Rubor: No No Abnormality N/A N/A Temperature: Yes N/A N/A Tenderness on Palpation: Debridement N/A N/A Procedures Performed: Treatment Notes Electronic Signature(s) Signed: 03/30/2022 4:36:48 PM By: Kalman Shan DO Entered By: Kalman Shan on 03/30/2022 15:50:05 [name] ([id number]) [id number] Page 4 of 8 -------------------------------------------------------------------------------- Multi-Disciplinary Care Plan Details Patient Name: Date of Service: [name] Houston, [name] Houston 03/30/2022 1:30 PM Medical Record Number: [id number] Patient Account Number: [id number] Date of Birth/Sex: Treating RN: 04/05/1956 (66 y.o. [name] [place], [name] Houston Primary Care Jahzaria Vary: Harlan Stains Other Clinician: Referring Tishie Altmann: Treating Jeneva Schweizer/Extender: [name] Houston Weeks in Treatment: 8 Active Inactive Wound/Skin Impairment Nursing Diagnoses: Impaired tissue integrity Knowledge deficit related to ulceration/compromised skin integrity Goals: Patient will have a decrease in wound volume by X% from date: (specify in notes) Date Initiated: 01/31/2022 Target Resolution Date: 04/21/2022 Goal Status: Active Patient/caregiver will verbalize understanding of skin care regimen Date Initiated: 01/31/2022 Target Resolution Date: 04/27/2022 Goal Status: Active Ulcer/skin breakdown will have a volume reduction of 30% by week 4 Date Initiated:  01/31/2022 Date Inactivated: 02/28/2022 Target Resolution Date: 03/03/2022 Unmet Reason: 19.9% healed in area Goal Status: Unmet and volume. Continue wound care treatment. Interventions: Assess patient/caregiver ability to obtain necessary supplies Assess patient/caregiver ability to perform ulcer/skin care regimen upon admission and as needed Assess ulceration(s) every visit Notes: Electronic Signature(s) Signed: 03/30/2022 5:26:06 PM By: Rhae Hammock RN Entered By: Rhae Hammock on 03/30/2022 16:31:25 -------------------------------------------------------------------------------- Pain Assessment Details Patient Name: Date of Service: [name] Cavalier RO N J. 03/30/2022 1:30 PM Medical Record Number: [id number] Patient Account Number: [id number] Date of Birth/Sex: Treating RN: February 04, 1956 (66 y.o. [name] Primary Care Rosario Kushner: Harlan Stains Other Clinician: Referring Shemeka Wardle: Treating Osmin Welz/Extender: [name] Houston Weeks in Treatment: 8 Active Problems Location of Pain Severity and Description of Pain Patient Has Paino Yes Site Locations Rate the pain. [name] Houston, [name] Houston ([id number]) [id number] Page 5 of 8 Rate the pain. Current Pain Level: 5 Pain Management and Medication Current Pain Management: Medication: No Cold Application: No Rest: No Massage: No Activity: No T.E.N.S.: No Heat Application:  No Leg drop or elevation: No Is the Current Pain Management Adequate: Adequate How does your wound impact your activities of daily livingo Sleep: No Bathing: No Appetite: No Relationship With Others: No Bladder Continence: No Emotions: No Bowel Continence: No Work: No Toileting: No Drive: No Dressing: No Hobbies: No Electronic Signature(s) Signed: 03/30/2022 6:11:43 PM By: Deon Pilling RN, BSN Entered By: Deon Pilling on 03/30/2022  13:29:24 -------------------------------------------------------------------------------- Patient/Caregiver Education Details Patient Name: Date of Service: [name] Houston 11/30/2023andnbsp1:30 PM Medical Record Number: [id number] Patient Account Number: [id number] Date of Birth/Gender: Treating RN: 10/05/1955 (66 y.o. [name] Houston, [name] Houston Primary Care Physician: Harlan Stains Other Clinician: Referring Physician: Treating Physician/Extender: [name] Houston in Treatment: 8 Education Assessment Education Provided To: Patient Education Topics Provided Wound/Skin Impairment: Methods: Explain/Verbal Responses: Reinforcements needed, State content correctly Electronic Signature(s) Signed: 03/30/2022 5:26:06 PM By: Rhae Hammock RN Entered By: Rhae Hammock on 03/30/2022 16:31:38 [name] ([id number]) [id number] Page 6 of 8 -------------------------------------------------------------------------------- Wound Assessment Details Patient Name: Date of Service: [name] Houston, [name] Houston 03/30/2022 1:30 PM Medical Record Number: [id number] Patient Account Number: [id number] Date of Birth/Sex: Treating RN: Jun 12, 1955 (66 y.o. [name] Houston, [name] Houston Primary Care Hasson Gaspard: Harlan Stains Other Clinician: Referring Timiko Offutt: Treating Angelene Rome/Extender: [name] Houston Weeks in Treatment: 8 Wound Status Wound Number: 2 Primary Arterial Insufficiency Ulcer Etiology: Wound Location: Left, Posterior Lower Leg Wound Open Wounding Event: Gradually Appeared Status: Date Acquired: 11/21/2021 Comorbid Hypertension, Peripheral Arterial Disease, Peripheral Venous Weeks Of Treatment: 8 History: Disease, Type II Diabetes, Osteoarthritis, Osteomyelitis Clustered Wound: No Photos Wound Measurements Length: (cm) 5. Width: (cm) 4 Depth: (cm) 0. Area: (cm) 1 Volume: (cm) 1 1 % Reduction in Area: 54.7% % Reduction in  Volume: 77.3% 1 Epithelialization: Medium (34-66%) 6.022 Tunneling: No .602 Wound Description Classification: Full Thickness Without Exposed Suppor Wound Margin: Distinct, outline attached Exudate Amount: Medium Exudate Type: Serosanguineous Exudate Color: red, brown t Structures Foul Odor After Cleansing: No Slough/Fibrino Yes Wound Bed Granulation Amount: Large (67-100%) Exposed Structure Granulation Quality: Red, Pink, Hyper-granulation Fascia Exposed: No Necrotic Amount: Small (1-33%) Fat Layer (Subcutaneous Tissue) Exposed: Yes Necrotic Quality: Adherent Slough Tendon Exposed: No Muscle Exposed: No Joint Exposed: No Bone Exposed: No Periwound Skin Texture Texture Color No Abnormalities Noted: No No Abnormalities Noted: No Callus: No Atrophie Blanche: No Crepitus: No Cyanosis: No Excoriation: No Ecchymosis: No Induration: No Erythema: No Rash: No Hemosiderin Staining: No Scarring: No Mottled: No Pallor: No Moisture Rubor: No No Abnormalities Noted: No Dry / Scaly: No Temperature / Pain Maceration: No Temperature: No Abnormality Tenderness on Palpation: Yes [name] Houston, [name] Houston ([id number]) [id number] Page 7 of 8 Treatment Notes Wound #2 (Lower Leg) Wound Laterality: Left, Posterior Cleanser Soap and Water Discharge Instruction: May shower and wash wound with dial antibacterial soap and water prior to dressing change. Wound Cleanser Discharge Instruction: Cleanse the wound with wound cleanser prior to applying a clean dressing using gauze sponges, not tissue or cotton balls. Dakin's Solution 0.5%,16 (oz) Peri-Wound Care Topical Primary Dressing MediHoney Gel, tube 1.5 (oz) Discharge Instruction: Apply to wound bed under the hydrofera blue. Hydrofera Blue Ready Foam, 4x5 in Discharge Instruction: Apply to wound bed as instructed Secondary Dressing ABD Pad, 5x9 Discharge Instruction: Apply over primary dressing as  directed. Woven Gauze Sponge, Non-Sterile 4x4 in Discharge Instruction: Apply over primary dressing as directed. Secured With [employer], 4.5x3.1 (in/yd) Discharge Instruction: Secure with Kerlix as directed. 71M Medipore H Soft Cloth Surgical T ape, 4  x 10 (in/yd) Discharge Instruction: Secure with tape as directed. Compression Wrap Tubigrip Size E Discharge Instruction: apply in the morning and remove at night. Compression Stockings Add-Ons Electronic Signature(s) Signed: 03/30/2022 6:11:43 PM By: Deon Pilling RN, BSN Entered By: Deon Pilling on 03/30/2022 13:34:30 -------------------------------------------------------------------------------- Vitals Details Patient Name: Date of Service: [name] Cavalier RO N J. 03/30/2022 1:30 PM Medical Record Number: [id number] Patient Account Number: [id number] Date of Birth/Sex: Treating RN: [birth year] (66 y.o. [name] Houston, [name] Houston Primary Care Jahnyla Parrillo: Harlan Stains Other Clinician: Referring Donia Yokum: Treating Shereta Crothers/Extender: [name] Houston Weeks in Treatment: 8 Vital Signs Time Taken: 13:25 Temperature (F): 97.9 Height (in): 64 Pulse (bpm): 70 Weight (lbs): 197 Respiratory Rate (breaths/min): 20 Body Mass Index (BMI): 33.8 Blood Pressure (mmHg): 145/63 Capillary Blood Glucose (mg/dl): 95 Reference Range: 80 - 120 mg / dl Electronic Signature(s) CORINTHIAN, KEMLER ([id number]) [id number] Page 8 of 8 Signed: 03/30/2022 6:11:43 PM By: Deon Pilling RN, BSN Entered By: Deon Pilling on 03/30/2022 13:32:17

## 2022-04-04 ENCOUNTER — Encounter (HOSPITAL_BASED_OUTPATIENT_CLINIC_OR_DEPARTMENT_OTHER): Payer: HMO | Attending: Internal Medicine | Admitting: Internal Medicine

## 2022-04-04 DIAGNOSIS — E1151 Type 2 diabetes mellitus with diabetic peripheral angiopathy without gangrene: Secondary | ICD-10-CM | POA: Diagnosis not present

## 2022-04-04 DIAGNOSIS — I70248 Atherosclerosis of native arteries of left leg with ulceration of other part of lower left leg: Secondary | ICD-10-CM | POA: Diagnosis present

## 2022-04-04 DIAGNOSIS — E11622 Type 2 diabetes mellitus with other skin ulcer: Secondary | ICD-10-CM | POA: Diagnosis not present

## 2022-04-04 DIAGNOSIS — N183 Chronic kidney disease, stage 3 unspecified: Secondary | ICD-10-CM | POA: Insufficient documentation

## 2022-04-04 DIAGNOSIS — E1122 Type 2 diabetes mellitus with diabetic chronic kidney disease: Secondary | ICD-10-CM | POA: Insufficient documentation

## 2022-04-04 DIAGNOSIS — L97828 Non-pressure chronic ulcer of other part of left lower leg with other specified severity: Secondary | ICD-10-CM | POA: Diagnosis not present

## 2022-04-04 NOTE — Progress Notes (Signed)
[name] Houston, [name] Houston ([id number]) [id number] Page 1 of 10 Visit Report for 04/04/2022 Chief Complaint Document Details Patient Name: Date of Service: [name] Houston 04/04/2022 1:00 PM Medical Record Number: [id number] Patient Account Number: [id number] Date of Birth/Sex: Treating RN: [birth year] (66 y.o. F) Primary Care Provider: Harlan Stains Other Clinician: Referring Provider: Treating Provider/Extender: Jake Church in Treatment: 9 Information Obtained from: Patient Chief Complaint 10/20/2021; Left posterior leg wound 01/31/2022; left posterior leg wound Electronic Signature(s) Signed: 04/04/2022 2:57:20 PM By: Kalman Shan DO Entered By: Kalman Shan on 04/04/2022 13:30:46 -------------------------------------------------------------------------------- Cellular or Tissue Based Product Details Patient Name: Date of Service: [name] Cavalier RO N J. 04/04/2022 1:00 PM Medical Record Number: [id number] Patient Account Number: [id number] Date of Birth/Sex: Treating RN: January 31, 1956 (66 y.o. [name] Houston Primary Care Provider: Harlan Stains Other Clinician: Referring Provider: Treating Provider/Extender: Jake Church in Treatment: 9 Cellular or Tissue Based Product Type Wound #2 Left,Posterior Lower Leg Applied to: Performed By: Physician Kalman Shan, DO Cellular or Tissue Based Product Type: Apligraf Level of Consciousness (Pre-procedure): Awake and Alert Pre-procedure Verification/Time Out Yes - 13:10 Taken: Location: trunk / arms / legs Wound Size (sq cm): 19.6 Product Size (sq cm): 44 Waste Size (sq cm): 15 Amount of Product Applied (sq cm): 29 Instrument Used: Forceps, Scissors Lot #: [id number] Order #: 1 Expiration Date: 04/12/2022 Fenestrated: Yes Instrument: Blade Reconstituted: Yes Solution Type: normal saline Solution Amount: 23m Lot #: [id number]  Expiration Date: 09/28/2024 Secured: Yes Secured With: Steri-Strips, adaptic Dressing Applied: No Procedural Pain: 0 Post Procedural Pain: 0 Response to Treatment: Procedure was tolerated well [name] Houston, [name] Houston([id number] Page 2 of 10 Level of Consciousness (Post- Awake and Alert procedure): Post Procedure Diagnosis Same as Pre-procedure Electronic Signature(s) Signed: 04/04/2022 2:57:20 PM By: HKalman ShanDO Signed: 04/04/2022 3:03:04 PM By: DDeon PillingRN, BSN Entered By: DDeon Pillingon 04/04/2022 13:16:55 -------------------------------------------------------------------------------- Debridement Details Patient Name: Date of Service: [name] CavalierRO N J. 04/04/2022 1:00 PM Medical Record Number: [id number] Account Number: [id number] of Birth/Sex: Treating RN: 411-25-1957(66y.o. FHelene Houston BMeta.RedingPrimary Care Provider: WHarlan StainsOther Clinician: Referring Provider: Treating Provider/Extender: HJake Churchin Treatment: 9 Debridement Performed for Assessment: Wound #2 Left,Posterior Lower Leg Performed By: Physician HKalman Shan DO Debridement Type: Debridement Severity of Tissue Pre Debridement: Fat layer exposed Level of Consciousness (Pre-procedure): Awake and Alert Pre-procedure Verification/Time Out Yes - 13:05 Taken: Start Time: 13:06 Pain Control: Lidocaine 5% topical ointment T Area Debrided (L x W): otal 4.9 (cm) x 4 (cm) = 19.6 (cm) Tissue and other material debrided: Viable, Non-Viable, Slough, Subcutaneous, Slough, Hyper-granulation Level: Skin/Subcutaneous Tissue Debridement Description: Excisional Instrument: Curette Bleeding: Minimum Hemostasis Achieved: Pressure End Time: 13:09 Procedural Pain: 0 Post Procedural Pain: 0 Response to Treatment: Procedure was tolerated well Level of Consciousness (Post- Awake and Alert procedure): Post Debridement  Measurements of Total Wound Length: (cm) 4.9 Width: (cm) 4 Depth: (cm) 0.1 Volume: (cm) 1.539 Character of Wound/Ulcer Post Debridement: Improved Severity of Tissue Post Debridement: Fat layer exposed Post Procedure Diagnosis Same as Pre-procedure Electronic Signature(s) Signed: 04/04/2022 2:57:20 PM By: HKalman ShanDO Signed: 04/04/2022 3:03:04 PM By: DDeon PillingRN, BSN Entered By: DDeon Pillingon 04/04/2022 13:14:18 [name] Houston([id number] Page 3 of 10 -------------------------------------------------------------------------------- HPI Details Patient Name: Date of Service: [name] Houston, [name]12/08/2021 1:00 PM Medical Record Number: [id number] Account Number: [id number] of Birth/Sex: Treating  RN: [birth year] (66 y.o. F) Primary Care Provider: Harlan Stains Other Clinician: Referring Provider: Treating Provider/Extender: Jake Church in Treatment: 9 History of Present Illness HPI Description: Admission 06/26/2021 [name] is a 66 year old female with a past medical history of uncontrolled type 2 diabetes on oral agents with last hemoglobin A1c of 9, peripheral arterial disease that presents to the clinic for a 3-week history of worsening wound to the posterior aspect of her left leg. She is not sure how it started. She has been using normal saline to the wound bed. She had arterial segmental pressures that showed a left ABI of 0.52 she is scheduled to see vein and vascular on 7/5. She is currently on doxycycline prescribed by her primary care doctor. She currently denies systemic signs of infection. 7/7; patient presents for follow-up. She saw Dr. Donzetta Matters yesterday, vein and vascular to discuss her arterial status. He is scheduling her for an arteriogram for possible intervention. She has had no issues with the Medihoney. 7/24; patient presents for follow-up. She continues to use  Medihoney. She is scheduled to have a stress test on 7/28 in order to be cleared for vascular surgery. Plan is for left common femoral endarterectomy and left common femoral to below the knee popliteal artery bypass. Readmission 01/31/2022 Patient had a left iliofemoral endarterectomy with vein patch angioplasty and common femoral to above-the-knee popliteal bypass with PTFE by Dr. Donzetta Matters on 12/06/2021. She had follow-up with vein and vascular on 01/18/2022 and it was noted that her left foot is well-perfused with brisk DP and PT Doppler signals. She has follow-up again with vein and vascular at the end of this month for bypass duplex and ABIs. She is currently been using Medihoney to the wound bed. She reports improvement to her chronic pain. She currently denies signs of infection. 10/10; patient presents for follow-up. She has been she has been using Dakin's wet-to-dry dressings however she was having pain on dressing changes. We switched to [facility] in the week. She is done better with this. She has no issues or complaints today. 10/17; patient presents for follow-up. She has been using Santyl and Hydrofera Blue to the wound bed. There is been improvement in wound healing. She has no issues or complaints today. 10/31; patient presents for follow-up. She has been using Santyl and Hydrofera Blue to the wound bed. She has no issues or complaints today. She saw vein and vascular on 10/25 they repeated a TBI on the left which was 0.66. She had a bypass graft duplex that showed biphasic waveforms of throughout the left lower extremity 11/10; patient presents for follow-up. She has recently run out of the [place] and has continued to use Hydrofera Blue to the wound bed. She has no issues or complaints today. 11/16; patient presents for follow-up. She states she still had a little bit of Santyl left in the tube that she was able to use to the wound bed this past week. She is continued with Hydrofera  Blue. She has been using the Tubigrip. She has no issues or complaints today. 11/30; patient presents for follow-up. She states she continues to have a little bit of Santyl left and has been using this with Hydrofera Blue. She knows to use Medihoney once this runs out. She has been using Tubigrip. She has no issues or complaints today. 12/5; patient presents for follow-up. She is approved for Apligraf and this was available for placement today. She has no issues or complaints today.  She has home health. Electronic Signature(s) Signed: 04/04/2022 2:57:20 PM By: Kalman Shan DO Entered By: Kalman Shan on 04/04/2022 13:31:11 -------------------------------------------------------------------------------- Physical Exam Details Patient Name: Date of Service: [name] Cavalier RO N J. 04/04/2022 1:00 PM Medical Record Number: [id number] Patient Account Number: [id number] Date of Birth/Sex: Treating RN: 01-Mar-1956 (66 y.o. F) Primary Care Provider: Harlan Stains Other Clinician: Referring Provider: Treating Provider/Extender: [name] Weeks in Treatment: 9 Constitutional respirations regular, non-labored and within target range for patient.. Cardiovascular 2+ dorsalis pedis/posterior tibialis pulses. Psychiatric [name] Houston, [name] Houston ([id number]) [id number] Page 4 of 10 pleasant and cooperative. Notes Left lower extremity: T the posterior aspect there is an open wound with scant nonviable tissue and granulation tissue. No surrounding signs of infection. o Electronic Signature(s) Signed: 04/04/2022 2:57:20 PM By: Kalman Shan DO Entered By: Kalman Shan on 04/04/2022 13:31:56 -------------------------------------------------------------------------------- Physician Orders Details Patient Name: Date of Service: [name] Cavalier RO N J. 04/04/2022 1:00 PM Medical Record Number: [id number] Patient Account Number: [id number] Date of  Birth/Sex: Treating RN: 10-08-1955 (66 y.o. 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 - Dr. Heber [place] Return A ppointment in 2 weeks. - Dr. Heber [place] Return A Anesthetic (In clinic) Topical Lidocaine 5% applied to wound bed Cellular or Tissue Based Products Wound #2 Left,Posterior Lower Leg Cellular or Tissue Based Product Type: - 04/04/2022 Apligraf applied #1. Cellular or Tissue Based Product applied to wound bed, secured with steri-strips, cover with Adaptic or Mepitel. (DO NOT REMOVE). Bathing/ Shower/ Hygiene May shower with protection but do not get wound dressing(s) wet. Edema Control - Lymphedema / SCD / Other Elevate legs to the level of the heart or above for 30 minutes daily and/or when sitting, a frequency of: - throughout the day Avoid standing for long periods of time. Exercise regularly Additional Orders / Instructions Follow Nutritious Diet - Monitor/Control Blood Sugars Home Health New wound care orders this week; continue Home Health for wound care. May utilize formulary equivalent dressing for wound treatment orders unless otherwise specified. - Home health to change twice a week OUTER DRESSING ONLY.***** Dressing changes to be completed by [place] on Monday / Wednesday / Friday except when patient has scheduled visit at [facility]. Other Home Health Orders/Instructions: - [facility] home health Wound Treatment Wound #2 - Lower Leg Wound Laterality: Left,  Posterior Cleanser: Soap and Water (Home Health) 1 x Per Week/30 Days Discharge Instructions: May shower and wash wound with dial antibacterial soap and water prior to dressing change. Cleanser: Wound Cleanser (Home Health) 1 x Per Week/30 Days Discharge Instructions: Cleanse the wound with wound cleanser prior to applying a clean dressing using gauze sponges, not tissue or cotton balls. Peri-Wound Care: Sween Lotion (Moisturizing lotion) (Generic) 1 x Per Week/30 Days Discharge Instructions: Apply moisturizing lotion as directed [name] Houston, [name] Houston ([id number]) [id number] Page 5 of 10 Prim Dressing: ADAPTIC TOUCH 3x4.25 (in/in) 1 x Per Week/30 Days ary Discharge Instructions: LEAVE ADAPIC AND STERI-STRIPS IN PLACE. Prim Dressing: apligraf 1 x Per Week/30 Days ary Discharge Instructions: APPLIED BY PROVIDER. LEAVE IN PLACE. Secondary Dressing: ABD Pad,  5x9 (Home Health) 1 x Per Week/30 Days Discharge Instructions: Apply over primary dressing as directed. Secondary Dressing: Woven Gauze Sponge, Non-Sterile 4x4 in (Home Health) 1 x Per Week/30 Days Discharge Instructions: Apply over primary dressing as directed. Compression Wrap: Kerlix Roll 4.5x3.1 (in/yd) (Home Health) 1 x Per Week/30 Days Discharge Instructions: Apply Kerlix and Coban compression as directed. Compression Wrap: Coban Self-Adherent Wrap 4x5 (in/yd) (Home Health) 1 x Per Week/30 Days Discharge Instructions: Apply over Kerlix as directed. Electronic Signature(s) Signed: 04/04/2022 2:57:20 PM By: Kalman Shan DO Entered By: Kalman Shan on 04/04/2022 13:32:07 -------------------------------------------------------------------------------- Problem List Details Patient Name: Date of Service: [name] Cavalier RO N J. 04/04/2022 1:00 PM Medical Record Number: [id number] Patient Account Number: [id number] Date of Birth/Sex: Treating RN: 14-Aug-1955 (66 y.o. [name] Houston, Meta.Reding Primary Care Provider:  Harlan Stains Other Clinician: Referring Provider: Treating Provider/Extender: Jake Church in Treatment: 9 Active Problems ICD-10 Encounter Code Description Active Date MDM Diagnosis I70.248 Atherosclerosis of native arteries of left leg with ulceration of other part of 01/31/2022 No Yes lower leg L97.828 Non-pressure chronic ulcer of other part of left lower leg with other specified 01/31/2022 No Yes severity E11.622 Type 2 diabetes mellitus with other skin ulcer 01/31/2022 No Yes Inactive Problems Resolved Problems Electronic Signature(s) Signed: 04/04/2022 2:57:20 PM By: Kalman Shan DO Entered By: Kalman Shan on 04/04/2022 13:19:43 Sander Houston ([id number]) [id number] Page 6 of 10 -------------------------------------------------------------------------------- Progress Note Details Patient Name: Date of Service: [name] Houston, [name] Houston 04/04/2022 1:00 PM Medical Record Number: [id number] Patient Account Number: [id number] Date of Birth/Sex: Treating RN: July 03, 1955 (66 y.o. F) Primary Care Provider: Harlan Stains Other Clinician: Referring Provider: Treating Provider/Extender: Jake Church in Treatment: 9 Subjective Chief Complaint Information obtained from Patient 10/20/2021; Left posterior leg wound 01/31/2022; left posterior leg wound History of Present Illness (HPI) Admission 06/26/2021 Ms. Melyssa Signor is a 66 year old female with a past medical history of uncontrolled type 2 diabetes on oral agents with last hemoglobin A1c of 9, peripheral arterial disease that presents to the clinic for a 3-week history of worsening wound to the posterior aspect of her left leg. She is not sure how it started. She has been using normal saline to the wound bed. She had arterial segmental pressures that showed a left ABI of 0.52 she is scheduled to see vein and vascular on 7/5. She is currently on  doxycycline prescribed by her primary care doctor. She currently denies systemic signs of infection. 7/7; patient presents for follow-up. She saw Dr. Donzetta Matters yesterday, vein and vascular to discuss her arterial status. He is scheduling her for an arteriogram for possible intervention. She has had no issues with the Medihoney. 7/24; patient presents for follow-up. She continues to use Medihoney. She is scheduled to have a stress test on 7/28 in order to be cleared for vascular surgery. Plan is for left common femoral endarterectomy and left common femoral to below the knee popliteal artery bypass. Readmission 01/31/2022 Patient had a left iliofemoral endarterectomy with vein patch angioplasty and common femoral to above-the-knee popliteal bypass with PTFE by Dr. Donzetta Matters on 12/06/2021. She had follow-up with vein and vascular on 01/18/2022 and it was noted that her left foot is well-perfused with brisk DP and PT Doppler signals. She has follow-up again with vein and vascular at the end of this month for bypass duplex and ABIs. She is currently been using Medihoney to the wound bed. She reports improvement to her chronic pain. She currently  denies signs of infection. 10/10; patient presents for follow-up. She has been she has been using Dakin's wet-to-dry dressings however she was having pain on dressing changes. We switched to [facility] in the week. She is done better with this. She has no issues or complaints today. 10/17; patient presents for follow-up. She has been using Santyl and Hydrofera Blue to the wound bed. There is been improvement in wound healing. She has no issues or complaints today. 10/31; patient presents for follow-up. She has been using Santyl and Hydrofera Blue to the wound bed. She has no issues or complaints today. She saw vein and vascular on 10/25 they repeated a TBI on the left which was 0.66. She had a bypass graft duplex that showed biphasic waveforms of throughout the left lower  extremity 11/10; patient presents for follow-up. She has recently run out of the [place] and has continued to use Hydrofera Blue to the wound bed. She has no issues or complaints today. 11/16; patient presents for follow-up. She states she still had a little bit of Santyl left in the tube that she was able to use to the wound bed this past week. She is continued with Hydrofera Blue. She has been using the Tubigrip. She has no issues or complaints today. 11/30; patient presents for follow-up. She states she continues to have a little bit of Santyl left and has been using this with Hydrofera Blue. She knows to use Medihoney once this runs out. She has been using Tubigrip. She has no issues or complaints today. 12/5; patient presents for follow-up. She is approved for Apligraf and this was available for placement today. She has no issues or complaints today. She has home health. Patient History Information obtained from Patient, Caregiver. Family History Cancer - Father, Diabetes - Mother,Maternal Grandparents,Father, Hypertension - Mother, No family history of Heart Disease, Hereditary Spherocytosis, Kidney Disease, Lung Disease, Seizures, Stroke, Thyroid Problems, Tuberculosis. Social History Former smoker, Marital Status - Married, Alcohol Use - Rarely, Drug Use - No History, Caffeine Use - Daily. Medical History Eyes Denies history of Cataracts, Glaucoma, Optic Neuritis Cardiovascular Patient has history of Hypertension, Peripheral Arterial Disease, Peripheral Venous Disease Denies history of Angina, Arrhythmia, Coronary Artery Disease, Deep Vein Thrombosis, Hypotension, Myocardial Infarction, Phlebitis, Vasculitis Gastrointestinal Denies history of Cirrhosis , Colitis, Crohnoos, Hepatitis A, Hepatitis B, Hepatitis C Endocrine Patient has history of Type II Diabetes - A1C-9.1 Denies history of Type I Diabetes Immunological Denies history of Lupus Erythematosus, Raynaudoos,  Scleroderma Musculoskeletal [name] Houston, [name] Houston ([id number]) [id number] Page 7 of 10 Patient has history of Osteoarthritis, Osteomyelitis - Hx ankle Denies history of Gout, Rheumatoid Arthritis Neurologic Denies history of Neuropathy, Quadriplegia, Paraplegia, Seizure Disorder Hospitalization/Surgery History - left knee. Medical A Surgical History Notes nd Genitourinary CKD-3 Objective Constitutional respirations regular, non-labored and within target range for patient.. Vitals Time Taken: 12:55 PM, Height: 64 in, Weight: 197 lbs, BMI: 33.8, Temperature: 98.1 F, Pulse: 83 bpm, Respiratory Rate: 20 breaths/min, Blood Pressure: 162/73 mmHg, Capillary Blood Glucose: 105 mg/dl. Cardiovascular 2+ dorsalis pedis/posterior tibialis pulses. Psychiatric pleasant and cooperative. General Notes: Left lower extremity: T the posterior aspect there is an open wound with scant nonviable tissue and granulation tissue. No surrounding signs of o infection. Integumentary (Hair, Skin) Wound #2 status is Open. Original cause of wound was Gradually Appeared. The date acquired was: 11/21/2021. The wound has been in treatment 9 weeks. The wound is located on the Left,Posterior Lower Leg. The wound measures 4.9cm length x 4cm width x  0.1cm depth; 15.394cm^2 area and 1.539cm^3 volume. There is Fat Layer (Subcutaneous Tissue) exposed. There is no tunneling or undermining noted. There is a medium amount of serosanguineous drainage noted. The wound margin is distinct with the outline attached to the wound base. There is large (67-100%) red, pink, hyper - granulation within the wound bed. There is a small (1-33%) amount of necrotic tissue within the wound bed including Adherent Slough. The periwound skin appearance did not exhibit: Callus, Crepitus, Excoriation, Induration, Rash, Scarring, Dry/Scaly, Maceration, Atrophie Blanche, Cyanosis, Ecchymosis, Hemosiderin Staining, Mottled,  Pallor, Rubor, Erythema. Periwound temperature was noted as No Abnormality. The periwound has tenderness on palpation. Assessment Active Problems ICD-10 Atherosclerosis of native arteries of left leg with ulceration of other part of lower leg Non-pressure chronic ulcer of other part of left lower leg with other specified severity Type 2 diabetes mellitus with other skin ulcer Patient's wound appears well-healing. I debrided nonviable tissue. Apligraf #1 was placed in standard fashion. We will place this under Kerlix/Coban. She has home health and they will change the wrap. Follow-up in 1 week. Procedures Wound #2 Pre-procedure diagnosis of Wound #2 is an Arterial Insufficiency Ulcer located on the Left,Posterior Lower Leg .Severity of Tissue Pre Debridement is: Fat layer exposed. There was a Excisional Skin/Subcutaneous Tissue Debridement with a total area of 19.6 sq cm performed by Kalman Shan, DO. With the following instrument(s): Curette to remove Viable and Non-Viable tissue/material. Material removed includes Subcutaneous Tissue, Slough, and Hyper- granulation after achieving pain control using Lidocaine 5% topical ointment. A time out was conducted at 13:05, prior to the start of the procedure. A Minimum amount of bleeding was controlled with Pressure. The procedure was tolerated well with a pain level of 0 throughout and a pain level of 0 following the procedure. Post Debridement Measurements: 4.9cm length x 4cm width x 0.1cm depth; 1.539cm^3 volume. Character of Wound/Ulcer Post Debridement is improved. Severity of Tissue Post Debridement is: Fat layer exposed. Post procedure Diagnosis Wound #2: Same as Pre-Procedure Pre-procedure diagnosis of Wound #2 is an Arterial Insufficiency Ulcer located on the Left,Posterior Lower Leg. A skin graft procedure using a bioengineered skin substitute/cellular or tissue based product was performed by Kalman Shan, DO with the following  instrument(s): Forceps and Scissors. Apligraf was applied and secured with Steri-Strips and adaptic. 29 sq cm of product was utilized and 15 sq cm was wasted. Post Application, no dressing was applied. A Time Out was conducted at 13:10, prior to the start of the procedure. The procedure was tolerated well with a pain level of 0 throughout and a pain level of 0 following the procedure. Post procedure Diagnosis Wound #2: Same as Pre-Procedure [name] Houston, [name] Houston ([id number]) [id number] Page 8 of 10 . Plan Follow-up Appointments: Return Appointment in 1 week. - Dr. Heber [place] Return Appointment in 2 weeks. - Dr. Heber [place] Anesthetic: (In clinic) Topical Lidocaine 5% applied to wound bed Cellular or Tissue Based Products: Wound #2 Left,Posterior Lower Leg: Cellular or Tissue Based Product Type: - 04/04/2022 Apligraf applied #1. Cellular or Tissue Based Product applied to wound bed, secured with steri-strips, cover with Adaptic or Mepitel. (DO NOT REMOVE). Bathing/ Shower/ Hygiene: May shower with protection but do not get wound dressing(s) wet. Edema Control - Lymphedema / SCD / Other: Elevate legs to the level of the heart or above for 30 minutes daily and/or when sitting, a frequency of: - throughout the day Avoid standing for long periods of time. Exercise regularly Additional Orders / Instructions: Follow Nutritious  Diet - Monitor/Control Blood Sugars Home Health: New wound care orders this week; continue Home Health for wound care. May utilize formulary equivalent dressing for wound treatment orders unless otherwise specified. - Home health to change twice a week OUTER DRESSING ONLY.***** Dressing changes to be completed by [place] on Monday / Wednesday / Friday except when patient has scheduled visit at [facility]. Other Home Health Orders/Instructions: - [facility] home [facility] WOUND #2: - Lower Leg Wound Laterality: Left, Posterior Cleanser: Soap and  Water (Home Health) 1 x Per Week/30 Days Discharge Instructions: May shower and wash wound with dial antibacterial soap and water prior to dressing change. Cleanser: Wound Cleanser (Home Health) 1 x Per Week/30 Days Discharge Instructions: Cleanse the wound with wound cleanser prior to applying a clean dressing using gauze sponges, not tissue or cotton balls. Peri-Wound Care: Sween Lotion (Moisturizing lotion) (Generic) 1 x Per Week/30 Days Discharge Instructions: Apply moisturizing lotion as directed Prim Dressing: ADAPTIC TOUCH 3x4.25 (in/in) 1 x Per Week/30 Days ary Discharge Instructions: LEAVE ADAPIC AND STERI-STRIPS IN PLACE. Prim Dressing: apligraf 1 x Per Week/30 Days ary Discharge Instructions: APPLIED BY PROVIDER. LEAVE IN PLACE. Secondary Dressing: ABD Pad, 5x9 (Home Health) 1 x Per Week/30 Days Discharge Instructions: Apply over primary dressing as directed. Secondary Dressing: Woven Gauze Sponge, Non-Sterile 4x4 in (Home Health) 1 x Per Week/30 Days Discharge Instructions: Apply over primary dressing as directed. Com pression Wrap: Kerlix Roll 4.5x3.1 (in/yd) (Home Health) 1 x Per Week/30 Days Discharge Instructions: Apply Kerlix and Coban compression as directed. Com pression Wrap: Coban Self-Adherent Wrap 4x5 (in/yd) (Home Health) 1 x Per Week/30 Days Discharge Instructions: Apply over Kerlix as directed. 1. In office sharp debridement 2. Apligraf #1 placed in standard fashion 3. Follow-up in 1 week 4. Kerlix/Coban Electronic Signature(s) Signed: 04/04/2022 2:57:20 PM By: Kalman Shan DO Entered By: Kalman Shan on 04/04/2022 13:34:40 -------------------------------------------------------------------------------- HxROS Details Patient Name: Date of Service: [name] Cavalier RO N J. 04/04/2022 1:00 PM Medical Record Number: [id number] Patient Account Number: [id number] Date of Birth/Sex: Treating RN: 1955/06/14 (66 y.o. F) Primary Care Provider: Harlan Stains Other  Clinician: Referring Provider: Treating Provider/Extender: Jake Church in Treatment: 9 Information Obtained From Patient Caregiver [address] [place], BUCIO ([id number]) [id number] Page 9 of 10 Medical History: Negative for: Cataracts; Glaucoma; Optic Neuritis Cardiovascular Medical History: Positive for: Hypertension; Peripheral Arterial Disease; Peripheral Venous Disease Negative for: Angina; Arrhythmia; Coronary Artery Disease; Deep Vein Thrombosis; Hypotension; Myocardial Infarction; Phlebitis; Vasculitis Gastrointestinal Medical History: Negative for: Cirrhosis ; Colitis; Crohns; Hepatitis A; Hepatitis B; Hepatitis C Endocrine Medical History: Positive for: Type II Diabetes - A1C-9.1 Negative for: Type I Diabetes Time with diabetes: 12 years Treated with: Oral agents Blood sugar tested every day: No Genitourinary Medical History: Past Medical History Notes: CKD-3 Immunological Medical History: Negative for: Lupus Erythematosus; Raynauds; Scleroderma Musculoskeletal Medical History: Positive for: Osteoarthritis; Osteomyelitis - Hx ankle Negative for: Gout; Rheumatoid Arthritis Neurologic Medical History: Negative for: Neuropathy; Quadriplegia; Paraplegia; Seizure Disorder Immunizations Pneumococcal Vaccine: Received Pneumococcal Vaccination: No Implantable Devices None Hospitalization / Surgery History Type of Hospitalization/Surgery left knee Family and Social History Cancer: Yes - Father; Diabetes: Yes - Mother,Maternal Grandparents,Father; Heart Disease: No; Hereditary Spherocytosis: No; Hypertension: Yes - Mother; Kidney Disease: No; Lung Disease: No; Seizures: No; Stroke: No; Thyroid Problems: No; Tuberculosis: No; Former smoker; Marital Status - Married; Alcohol Use: Rarely; Drug Use: No History; Caffeine Use: Daily; Financial Concerns: No; Food, Clothing or Shelter Needs: No; Support System Lacking:  No; Transportation Concerns: No Electronic Signature(s) Signed: 04/04/2022 2:57:20 PM By: Kalman Shan DO Entered By: Kalman Shan on 04/04/2022 13:31:19 Sander Houston ([id number]) [id number] Page 10 of 10 -------------------------------------------------------------------------------- SuperBill Details Patient Name: Date of Service: TREONNA, KLEE 04/04/2022 Medical Record Number: [id number] Patient Account Number: [id number] Date of Birth/Sex: Treating RN: [birth year] (66 y.o. [name] Houston, Meta.Reding Primary Care Provider: Harlan Stains Other Clinician: Referring Provider: Treating Provider/Extender: [name] Weeks in Treatment: 9 Diagnosis Coding ICD-10 Codes Code Description I70.248 Atherosclerosis of native arteries of left leg with ulceration of other part of lower leg L97.828 Non-pressure chronic ulcer of other part of left lower leg with other specified severity E11.622 Type 2 diabetes mellitus with other skin ulcer Facility Procedures : CPT4 Code: 64158309 Description: (Facility Use Only) Apligraf 1 SQ CM Modifier: Quantity: 44 : CPT4 Code: 40768088 Description: 11031 - SKIN SUB GRAFT TRNK/ARM/LEG ICD-10 Diagnosis Description L97.828 Non-pressure chronic ulcer of other part of left lower leg with other specified s E11.622 Type 2 diabetes mellitus with other skin ulcer Modifier: everity Quantity: 1 Physician Procedures : CPT4 Code Description Modifier [id number] - WC PHYS SKIN SUB GRAFT TRNK/ARM/LEG ICD-10 Diagnosis Description L97.828 Non-pressure chronic ulcer of other part of left lower leg with other specified severity E11.622 Type 2 diabetes mellitus with other  skin ulcer Quantity: 1 Electronic Signature(s) Signed: 04/04/2022 2:57:20 PM By: Kalman Shan DO Entered By: Kalman Shan on 04/04/2022 13:34:52

## 2022-04-04 NOTE — Progress Notes (Signed)
NATORI, GUDINO ([id number]) [id number] Page 1 of 8 Visit Report for 04/04/2022 Arrival Information Details Patient Name: Date of Service: [name] Houston, [name] Houston 04/04/2022 1:00 PM Medical Record Number: [id number] Patient Account Number: [id number] Date of Birth/Sex: Treating RN: 09/04/1955 (66 y.o. [name] Houston, [name] Houston Primary Care [name] Houston: Harlan Stains Other Clinician: Referring Hadasah Brugger: Treating Elsy Chiang/Extender: Jake Church in Treatment: 9 Visit Information History Since Last Visit Added or deleted any medications: No Patient Arrived: Stretcher Any new allergies or adverse reactions: No Arrival Time: 12:56 Had a fall or experienced change in No Accompanied By: self activities of daily living that may affect Transfer Assistance: None risk of falls: Patient Identification Verified: Yes Signs or symptoms of abuse/neglect since last visito No Secondary Verification Process Completed: Yes Hospitalized since last visit: No Patient Requires Transmission-Based Precautions: No Implantable device outside of the clinic excluding No Patient Has Alerts: No cellular tissue based products placed in the center since last visit: Has Dressing in Place as Prescribed: Yes Pain Present Now: No Electronic Signature(s) Signed: 04/04/2022 3:03:04 PM By: Deon Pilling RN, BSN Entered By: Deon Pilling on 04/04/2022 12:56:47 -------------------------------------------------------------------------------- Encounter Discharge Information Details Patient Name: Date of Service: [name] Cavalier RO N J. 04/04/2022 1:00 PM Medical Record Number: [id number] Patient Account Number: [id number] Date of Birth/Sex: Treating RN: 10/05/1955 (66 y.o. [name] Houston Primary Care Deaisa Merida: Harlan Stains Other Clinician: Referring Danaiya Steadman: Treating Maxime Beckner/Extender: Jake Church in Treatment: 9 Encounter Discharge Information Items  Post Procedure Vitals Discharge Condition: Stable Temperature (F): 98.1 Ambulatory Status: Walker Pulse (bpm): 83 Discharge Destination: Home Respiratory Rate (breaths/min): 20 Transportation: Private Auto Blood Pressure (mmHg): 162/73 Accompanied By: husband Schedule Follow-up Appointment: Yes Clinical Summary of Care: Electronic Signature(s) Signed: 04/04/2022 3:03:04 PM By: Deon Pilling RN, BSN Entered By: Deon Pilling on 04/04/2022 13:18:29 [name] ([id number]) [id number] Page 2 of 8 -------------------------------------------------------------------------------- Lower Extremity Assessment Details Patient Name: Date of Service: [name] Houston, [name] Houston 04/04/2022 1:00 PM Medical Record Number: [id number] Patient Account Number: [id number] Date of Birth/Sex: Treating RN: [birth year] (66 y.o. [name] Houston Primary Care Ramzey Petrovic: Harlan Stains Other Clinician: Referring Traves Majchrzak: Treating Jude Linck/Extender: [name] Weeks in Treatment: 9 Edema Assessment Assessed: [Left: Yes] [Right: No] Edema: [Left: N] [Right: o] Calf Left: Right: Point of Measurement: 29 cm From Medial Instep 38 cm Ankle Left: Right: Point of Measurement: 7 cm From Medial Instep 23 cm Vascular Assessment Pulses: Dorsalis Pedis Palpable: [Left:Yes] Electronic Signature(s) Signed: 04/04/2022 3:03:04 PM By: Deon Pilling RN, BSN Entered By: Deon Pilling on 04/04/2022 13:01:07 -------------------------------------------------------------------------------- Multi Wound Chart Details Patient Name: Date of Service: [name] Cavalier RO N J. 04/04/2022 1:00 PM Medical Record Number: [id number] Patient Account Number: [id number] Date of Birth/Sex: Treating RN: 1955/11/20 (66 y.o. F) Primary Care Masai Kidd: Harlan Stains Other Clinician: Referring Aundre Hietala: Treating Loida Calamia/Extender: Jake Church in Treatment: 9 Vital  Signs Height(in): 64 Capillary Blood Glucose(mg/dl): 105 Weight(lbs): 197 Pulse(bpm): 33 Body Mass Index(BMI): 33.8 Blood Pressure(mmHg): 162/73 Temperature(F): 98.1 Respiratory Rate(breaths/min): 20 [2:Photos:] [N/A:N/A] Left, Posterior Lower Leg N/A N/A Wound Location: Gradually Appeared N/A N/A Wounding Event: Arterial Insufficiency Ulcer N/A N/A Primary Etiology: Hypertension, Peripheral Arterial N/A N/A Comorbid History: Disease, Peripheral Venous Disease, Type II Diabetes, Osteoarthritis, Osteomyelitis 11/21/2021 N/A N/A Date Acquired: 9 N/A N/A Weeks of Treatment: Open N/A N/A Wound Status: No N/A N/A Wound Recurrence: 4.9x4x0.1 N/A N/A Measurements L x W x D (cm) 15.394 N/A N/A A (cm) : rea 1.539  N/A N/A Volume (cm) : 56.40% N/A N/A % Reduction in A rea: 78.20% N/A N/A % Reduction in Volume: Full Thickness Without Exposed N/A N/A Classification: Support Structures Medium N/A N/A Exudate A mount: Serosanguineous N/A N/A Exudate Type: red, brown N/A N/A Exudate Color: Distinct, outline attached N/A N/A Wound Margin: Large (67-100%) N/A N/A Granulation A mount: Red, Pink, Hyper-granulation N/A N/A Granulation Quality: Small (1-33%) N/A N/A Necrotic A mount: Fat Layer (Subcutaneous Tissue): Yes N/A N/A Exposed Structures: Fascia: No Tendon: No Muscle: No Joint: No Bone: No Medium (34-66%) N/A N/A Epithelialization: Debridement - Excisional N/A N/A Debridement: Pre-procedure Verification/Time Out 13:05 N/A N/A Taken: Lidocaine 5% topical ointment N/A N/A Pain Control: Subcutaneous, Slough N/A N/A Tissue Debrided: Skin/Subcutaneous Tissue N/A N/A Level: 19.6 N/A N/A Debridement A (sq cm): rea Curette N/A N/A Instrument: Minimum N/A N/A Bleeding: Pressure N/A N/A Hemostasis A chieved: 0 N/A N/A Procedural Pain: 0 N/A N/A Post Procedural Pain: Procedure was tolerated well N/A N/A Debridement Treatment Response: 4.9x4x0.1  N/A N/A Post Debridement Measurements L x W x D (cm) 1.539 N/A N/A Post Debridement Volume: (cm) Excoriation: No N/A N/A Periwound Skin Texture: Induration: No Callus: No Crepitus: No Rash: No Scarring: No Maceration: No N/A N/A Periwound Skin Moisture: Dry/Scaly: No Atrophie Blanche: No N/A N/A Periwound Skin Color: Cyanosis: No Ecchymosis: No Erythema: No Hemosiderin Staining: No Mottled: No Pallor: No Rubor: No No Abnormality N/A N/A Temperature: Yes N/A N/A Tenderness on Palpation: Cellular or Tissue Based Product N/A N/A Procedures Performed: Debridement Treatment Notes Wound #2 (Lower Leg) Wound Laterality: Left, Posterior Cleanser Soap and Water Discharge Instruction: May shower and wash wound with dial antibacterial soap and water prior to dressing change. Wound Cleanser Discharge Instruction: Cleanse the wound with wound cleanser prior to applying a clean dressing using gauze sponges, not tissue or cotton balls. Peri-Wound Care Sween Lotion (Moisturizing lotion) Discharge Instruction: Apply moisturizing lotion as directed [name] Houston, [name] Houston ([id number]) [id number] Page 4 of 8 Topical Primary Dressing ADAPTIC TOUCH 3x4.25 (in/in) Discharge Instruction: LEAVE ADAPIC AND STERI-STRIPS IN PLACE. apligraf Discharge Instruction: APPLIED BY Jonella Redditt. LEAVE IN PLACE. Secondary Dressing ABD Pad, 5x9 Discharge Instruction: Apply over primary dressing as directed. Woven Gauze Sponge, Non-Sterile 4x4 in Discharge Instruction: Apply over primary dressing as directed. Secured With Compression Wrap Kerlix Roll 4.5x3.1 (in/yd) Discharge Instruction: Apply Kerlix and Coban compression as directed. Coban Self-Adherent Wrap 4x5 (in/yd) Discharge Instruction: Apply over Kerlix as directed. Compression Stockings Add-Ons Electronic Signature(s) Signed: 04/04/2022 2:57:20 PM By: Kalman Shan DO Entered By: Kalman Shan on 04/04/2022  13:19:49 -------------------------------------------------------------------------------- Multi-Disciplinary Care Plan Details Patient Name: Date of Service: [name] Cavalier RO N J. 04/04/2022 1:00 PM Medical Record Number: [id number] Patient Account Number: [id number] Date of Birth/Sex: Treating RN: 1955/09/20 (66 y.o. [name] Houston, Tammi Klippel Primary Care Katiejo Gilroy: Harlan Stains Other Clinician: Referring Tari Lecount: Treating Salwa Bai/Extender: [name] Weeks in Treatment: 9 Active Inactive Wound/Skin Impairment Nursing Diagnoses: Impaired tissue integrity Knowledge deficit related to ulceration/compromised skin integrity Goals: Patient will have a decrease in wound volume by X% from date: (specify in notes) Date Initiated: 01/31/2022 Target Resolution Date: 06/01/2022 Goal Status: Active Patient/caregiver will verbalize understanding of skin care regimen Date Initiated: 01/31/2022 Target Resolution Date: 06/01/2022 Goal Status: Active Ulcer/skin breakdown will have a volume reduction of 30% by week 4 Date Initiated: 01/31/2022 Date Inactivated: 02/28/2022 Target Resolution Date: 03/03/2022 Unmet Reason: 19.9% healed in area Goal Status: Unmet and volume. Continue wound care treatment. Interventions: Assess patient/caregiver ability to obtain necessary  supplies Assess patient/caregiver ability to perform ulcer/skin care regimen upon admission and as needed [name] Houston, [name] Houston ([id number]) [id number] Page 5 of 8 Assess ulceration(s) every visit Notes: Electronic Signature(s) Signed: 04/04/2022 3:03:04 PM By: Deon Pilling RN, BSN Entered By: Deon Pilling on 04/04/2022 13:10:01 -------------------------------------------------------------------------------- Pain Assessment Details Patient Name: Date of Service: [name] Cavalier RO N J. 04/04/2022 1:00 PM Medical Record Number: [id number] Patient Account Number: [id number] Date of Birth/Sex:  Treating RN: [birth year] (66 y.o. [name] Houston Primary Care Ashleigh Luckow: Harlan Stains Other Clinician: Referring Tamyah Cutbirth: Treating Arelys Glassco/Extender: [name] Weeks in Treatment: 9 Active Problems Location of Pain Severity and Description of Pain Patient Has Paino Yes Site Locations Pain Location: Pain in Ulcers Rate the pain. Current Pain Level: 7 Pain Management and Medication Current Pain Management: Medication: No Cold Application: No Rest: No Massage: No Activity: No T.E.N.S.: No Heat Application: No Leg drop or elevation: No Is the Current Pain Management Adequate: Adequate How does your wound impact your activities of daily livingo Sleep: No Bathing: No Appetite: No Relationship With Others: No Bladder Continence: No Emotions: No Bowel Continence: No Work: No Toileting: No Drive: No Dressing: No Hobbies: No Electronic Signature(s) Signed: 04/04/2022 3:03:04 PM By: Deon Pilling RN, BSN Entered By: Deon Pilling on 04/04/2022 12:59:08 [name] ([id number]) [id number] Page 6 of 8 -------------------------------------------------------------------------------- Patient/Caregiver Education Details Patient Name: Date of Service: [name] Houston, [name] Houston 12/5/2023andnbsp1:00 PM Medical Record Number: [id number] Patient Account Number: [id number] Date of Birth/Gender: Treating RN: 1956/03/05 (66 y.o. [name] Houston Primary Care Physician: Harlan Stains Other Clinician: Referring Physician: Treating Physician/Extender: Jake Church in Treatment: 9 Education Assessment Education Provided To: Patient Education Topics Provided Wound/Skin Impairment: Handouts: Skin Care Do's and Dont's Methods: Explain/Verbal Responses: Reinforcements needed Electronic Signature(s) Signed: 04/04/2022 3:03:04 PM By: Deon Pilling RN, BSN Entered By: Deon Pilling on 04/04/2022  13:10:18 -------------------------------------------------------------------------------- Wound Assessment Details Patient Name: Date of Service: [name] Cavalier RO N J. 04/04/2022 1:00 PM Medical Record Number: [id number] Patient Account Number: [id number] Date of Birth/Sex: Treating RN: 08/31/1955 (66 y.o. [name] Houston, [name] Houston Primary Care Tanmay Halteman: Harlan Stains Other Clinician: Referring Kelan Pritt: Treating Marwah Disbro/Extender: [name] Weeks in Treatment: 9 Wound Status Wound Number: 2 Primary Arterial Insufficiency Ulcer Etiology: Wound Location: Left, Posterior Lower Leg Wound Open Wounding Event: Gradually Appeared Status: Date Acquired: 11/21/2021 Comorbid Hypertension, Peripheral Arterial Disease, Peripheral Venous Weeks Of Treatment: 9 History: Disease, Type II Diabetes, Osteoarthritis, Osteomyelitis Clustered Wound: No Photos Wound Measurements Length: (cm) 4.9 Houston, [name] J ([id number]) Width: (cm) 4 Depth: (cm) 0.1 Area: (cm) 15 Volume: (cm) 1. % Reduction in Area: 56.4% [id number] Page 7 of 8 % Reduction in Volume: 78.2% Epithelialization: Medium (34-66%) .394 Tunneling: No 539 Undermining: No Wound Description Classification: Full Thickness Without Exposed Support Structures Wound Margin: Distinct, outline attached Exudate Amount: Medium Exudate Type: Serosanguineous Exudate Color: red, brown Foul Odor After Cleansing: No Slough/Fibrino Yes Wound Bed Granulation Amount: Large (67-100%) Exposed Structure Granulation Quality: Red, Pink, Hyper-granulation Fascia Exposed: No Necrotic Amount: Small (1-33%) Fat Layer (Subcutaneous Tissue) Exposed: Yes Necrotic Quality: Adherent Slough Tendon Exposed: No Muscle Exposed: No Joint Exposed: No Bone Exposed: No Periwound Skin Texture Texture Color No Abnormalities Noted: No No Abnormalities Noted: No Callus: No Atrophie Blanche: No Crepitus: No Cyanosis:  No Excoriation: No Ecchymosis: No Induration: No Erythema: No Rash: No Hemosiderin Staining: No Scarring: No Mottled: No Pallor: No Moisture Rubor: No No Abnormalities Noted: No Dry / Scaly: No Temperature /  Pain Maceration: No Temperature: No Abnormality Tenderness on Palpation: Yes Treatment Notes Wound #2 (Lower Leg) Wound Laterality: Left, Posterior Cleanser Soap and Water Discharge Instruction: May shower and wash wound with dial antibacterial soap and water prior to dressing change. Wound Cleanser Discharge Instruction: Cleanse the wound with wound cleanser prior to applying a clean dressing using gauze sponges, not tissue or cotton balls. Peri-Wound Care Sween Lotion (Moisturizing lotion) Discharge Instruction: Apply moisturizing lotion as directed Topical Primary Dressing ADAPTIC TOUCH 3x4.25 (in/in) Discharge Instruction: LEAVE ADAPIC AND STERI-STRIPS IN PLACE. apligraf Discharge Instruction: APPLIED BY Arul Farabee. LEAVE IN PLACE. Secondary Dressing ABD Pad, 5x9 Discharge Instruction: Apply over primary dressing as directed. Woven Gauze Sponge, Non-Sterile 4x4 in Discharge Instruction: Apply over primary dressing as directed. Secured With Compression Wrap Kerlix Roll 4.5x3.1 (in/yd) Discharge Instruction: Apply Kerlix and Coban compression as directed. Coban Self-Adherent Wrap 4x5 (in/yd) Discharge Instruction: Apply over Kerlix as directed. Compression Stockings [name] Houston, [name] Houston ([id number]) [id number] Page 8 of 8 Add-Ons Electronic Signature(s) Signed: 04/04/2022 3:03:04 PM By: Deon Pilling RN, BSN Entered By: Deon Pilling on 04/04/2022 13:03:09 -------------------------------------------------------------------------------- Vitals Details Patient Name: Date of Service: [name] Cavalier RO N J. 04/04/2022 1:00 PM Medical Record Number: [id number] Patient Account Number: [id number] Date of Birth/Sex: Treating RN: October 02, 1955 (66  y.o. [name] Houston, [name] Houston Primary Care Jakyia Gaccione: Harlan Stains Other Clinician: Referring Yovany Clock: Treating Aizley Stenseth/Extender: [name] Weeks in Treatment: 9 Vital Signs Time Taken: 12:55 Temperature (F): 98.1 Height (in): 64 Pulse (bpm): 83 Weight (lbs): 197 Respiratory Rate (breaths/min): 20 Body Mass Index (BMI): 33.8 Blood Pressure (mmHg): 162/73 Capillary Blood Glucose (mg/dl): 105 Reference Range: 80 - 120 mg / dl Electronic Signature(s) Signed: 04/04/2022 3:03:04 PM By: Deon Pilling RN, BSN Entered By: Deon Pilling on 04/04/2022 12:57:07

## 2022-04-14 ENCOUNTER — Encounter (HOSPITAL_BASED_OUTPATIENT_CLINIC_OR_DEPARTMENT_OTHER): Payer: HMO | Admitting: Internal Medicine

## 2022-04-14 DIAGNOSIS — I70248 Atherosclerosis of native arteries of left leg with ulceration of other part of lower left leg: Secondary | ICD-10-CM | POA: Diagnosis not present

## 2022-04-14 DIAGNOSIS — E11622 Type 2 diabetes mellitus with other skin ulcer: Secondary | ICD-10-CM | POA: Diagnosis not present

## 2022-04-14 DIAGNOSIS — L97828 Non-pressure chronic ulcer of other part of left lower leg with other specified severity: Secondary | ICD-10-CM

## 2022-04-14 NOTE — Progress Notes (Signed)
SOL, ODOR ([id number]) [id number] Page 1 of 10 Visit Report for 04/14/2022 Chief Complaint Document Details Patient Name: Date of Service: [name] [place], [name] Houston 04/14/2022 11:00 A M Medical Record Number: [id number] Patient Account Number: [id number] Date of Birth/Sex: Treating RN: [name] Houston (66 y.o. [name] Houston) Primary Care Provider: Harlan Houston Other Clinician: Referring Provider: Treating Provider/Extender: [name] [place] in Treatment: 10 Information Obtained from: Patient Chief Complaint 10/20/2021; Left posterior leg wound 01/31/2022; left posterior leg wound Electronic Signature(s) Signed: 04/14/2022 1:27:05 PM By: [name] Shan DO Entered By: [name] [place] on 04/14/2022 13:20:18 -------------------------------------------------------------------------------- Cellular or Tissue Based Product Details Patient Name: Date of Service: [name] [place] RO N J. 04/14/2022 11:00 A M Medical Record Number: [id number] Patient Account Number: [id number] Date of Birth/Sex: Treating RN: [birth year]Houston (66 y.o. [name] [place], [name] Houston Primary Care Provider: Harlan [place] Other Clinician: Referring Provider: Treating Provider/Extender: [name] [place] in Treatment: 10 Cellular or Tissue Based Product Type Wound #2 Left,Posterior Lower Leg Applied to: Performed By: Physician [name] Shan, DO Cellular or Tissue Based Product Type: Apligraf Level of Consciousness (Pre-procedure): Awake and Alert Pre-procedure Verification/Time Out Yes - 11:40 Taken: Location: trunk / arms / legs Wound Size (sq cm): 11.1 Product Size (sq cm): 44 Waste Size (sq cm): 20 Waste Reason: wound size Amount of Product Applied (sq cm): 24 Lot #: [id number] Order #: 2 Expiration Date: 04/22/2022 Fenestrated: Yes Instrument: Blade Reconstituted: Yes Solution Type: normal saline Solution Amount: 53m Lot #:: [id number]  Expiration Date: 09/28/2024 Secured: Yes Secured With: Steri-Strips, adaptic Dressing Applied: No Procedural Pain: 0 Post Procedural Pain: 0 Response to Treatment: Procedure was tolerated well [name] Houston, [name] Houston([id number] [phone] Page 2 of 10 Level of Consciousness (Post- Awake and Alert procedure): Post Procedure Diagnosis Same as Pre-procedure Electronic Signature(s) Signed: 04/14/2022 1:27:05 PM By: HKalman ShanDO Signed: 04/14/2022 2:20:22 PM By: DDeon PillingRN, BSN Entered By: DDeon Pillingon 04/14/2022 11:47:19 -------------------------------------------------------------------------------- HPI Details Patient Name: Date of Service: [name] CavalierRO N J. 04/14/2022 11:00 A M Medical Record Number: [id number] Account Number: [id number] of Birth/Sex: Treating RN: 407-Dec-Houston(66y.o. [name] [place]) Primary Care Provider: WHarlan StainsOther Clinician: Referring Provider: Treating Provider/Extender: HJake Churchin Treatment: 10 History of Present Illness HPI Description: Admission 06/26/2021 Ms. SZanai Mallariis a 66year old female with a past medical history of uncontrolled type 2 diabetes on oral agents with last hemoglobin A1c of 9, peripheral arterial disease that presents to the clinic for a 3-week history of worsening wound to the posterior aspect of her left leg. She is not sure how it started. She has been using normal saline to the wound bed. She had arterial segmental pressures that showed a left ABI of 0.52 she is scheduled to see vein and vascular on 7/5. She is currently on doxycycline prescribed by her primary care doctor. She currently denies systemic signs of infection. 7/7; patient presents for follow-up. She saw Dr. CDonzetta Mattersyesterday, vein and vascular to discuss her arterial status. He is scheduling her for an arteriogram for possible intervention. She has had no issues with the Medihoney. 7/24;  patient presents for follow-up. She continues to use Medihoney. She is scheduled to have a stress test on 7/28 in order to be cleared for vascular surgery. Plan is for left common femoral endarterectomy and left common femoral to below the knee popliteal artery bypass. Readmission 01/31/2022 Patient had a left iliofemoral endarterectomy with vein patch angioplasty and common femoral to above-the-knee  popliteal bypass with PTFE by Dr. Donzetta Matters on 12/06/2021. She had follow-up with vein and vascular on 01/18/2022 and it was noted that her left foot is well-perfused with brisk DP and PT Doppler signals. She has follow-up again with vein and vascular at the end of this month for bypass duplex and ABIs. She is currently been using Medihoney to the wound bed. She reports improvement to her chronic pain. She currently denies signs of infection. 10/10; patient presents for follow-up. She has been she has been using Dakin's wet-to-dry dressings however she was having pain on dressing changes. We switched to [facility] in the week. She is done better with this. She has no issues or complaints today. 10/17; patient presents for follow-up. She has been using Santyl and Hydrofera Blue to the wound bed. There is been improvement in wound healing. She has no issues or complaints today. 10/31; patient presents for follow-up. She has been using Santyl and Hydrofera Blue to the wound bed. She has no issues or complaints today. She saw vein and vascular on 10/25 they repeated a TBI on the left which was 0.66. She had a bypass graft duplex that showed biphasic waveforms of throughout the left lower extremity 11/10; patient presents for follow-up. She has recently run out of the [place] and has continued to use Hydrofera Blue to the wound bed. She has no issues or complaints today. 11/16; patient presents for follow-up. She states she still had a little bit of Santyl left in the tube that she was able to use to the wound bed  this past week. She is continued with Hydrofera Blue. She has been using the Tubigrip. She has no issues or complaints today. 11/30; patient presents for follow-up. She states she continues to have a little bit of Santyl left and has been using this with Hydrofera Blue. She knows to use Medihoney once this runs out. She has been using Tubigrip. She has no issues or complaints today. 12/5; patient presents for follow-up. She is approved for Apligraf and this was available for placement today. She has no issues or complaints today. She has home health. 12/15; patient presents for follow-up. Apligraf was placed in standard fashion at last clinic visit under [name]/[name]. She did well with this. The wound is measuring smaller. Electronic Signature(s) Signed: 04/14/2022 1:27:05 PM By: [name] Shan DO Entered By: [name] Houston on 04/14/2022 13:21:16 [name] Houston ([id number]) [id number] Page 3 of 10 -------------------------------------------------------------------------------- Chemical Cauterization Details Patient Name: Date of Service: [name] Houston, [name] Houston 04/14/2022 11:00 A M Medical Record Number: [id number] Patient Account Number: [id number] Date of Birth/Sex: Treating RN: Houston[birth year] (66 y.o. [name] Houston Primary Care Provider: Harlan Houston Other Clinician: Referring Provider: Treating Provider/Extender: [name] [place] in Treatment: 10 Procedure Performed for: Wound #2 Left,Posterior Lower Leg Performed By: Physician [name] Shan, DO Post Procedure Diagnosis Same as Pre-procedure Notes silver nitrate used. Electronic Signature(s) Signed: 04/14/2022 1:27:05 PM By: [name] Shan DO Signed: 04/14/2022 2:20:22 PM By: Deon Pilling RN, BSN Entered By: Deon Pilling on 04/14/2022 11:44:20 -------------------------------------------------------------------------------- Physical Exam Details Patient Name: Date of  Service: [name] Houston RO N J. 04/14/2022 11:00 A M Medical Record Number: [id number] Patient Account Number: [id number] Date of Birth/Sex: Treating RN: [birth year] Houston (66 y.o. [name] [place]) Primary Care Provider: Harlan [place] Other Clinician: Referring Provider: Treating Provider/Extender: [name] [place] in Treatment: 10 Constitutional respirations regular, non-labored and within target range for patient.. Cardiovascular 2+ dorsalis pedis/posterior tibialis pulses. Psychiatric pleasant and cooperative.  Notes Left lower extremity: T the posterior aspect there is an open wound with granulation tissue and hyper granulated areas. No signs of surrounding infection. o Epithelization occurring to the edges circumferentially. Electronic Signature(s) Signed: 04/14/2022 1:27:05 PM By: [name] Shan DO Entered By: [name] [place] on 04/14/2022 13:21:46 Physician Orders Details -------------------------------------------------------------------------------- [name] Houston ([id number]) [id number] Page 4 of 10 Patient Name: Date of Service: [name] Houston, [name] Houston 04/14/2022 11:00 A M Medical Record Number: [id number] Patient Account Number: [id number] Date of Birth/Sex: Treating RN: [birth year]Houston (66 y.o. [name] Houston Primary Care Provider: Harlan Houston Other Clinician: Referring Provider: Treating Provider/Extender: [name] [place] in Treatment: 10 Verbal / Phone Orders: No Diagnosis Coding Follow-up Appointments ppointment in 1 week. - Dr. Heber  Return A ppointment in 2 [place]. - Dr. Heber  Return A Anesthetic (In clinic) Topical Lidocaine 5% applied to wound bed Cellular or Tissue Based Products Wound #2 Left,Posterior Lower Leg Cellular or Tissue Based Product Type: - 04/04/2022 Apligraf applied #1. 04/14/2022 apligraf applied #2. Cellular or Tissue Based Product applied to wound bed, secured with steri-strips,  cover with Adaptic or Mepitel. (DO NOT REMOVE). Bathing/ Shower/ Hygiene May shower with protection but do not get wound dressing(s) wet. Edema Control - Lymphedema / SCD / Other Elevate legs to the level of the heart or above for 30 minutes daily and/or when sitting, a frequency of: - throughout the day Avoid standing for long periods of time. Exercise regularly Additional Orders / Instructions Follow Nutritious Diet - Monitor/Control Blood Sugars Home Health New wound care orders this week; continue Home Health for wound care. May utilize formulary equivalent dressing for wound treatment orders unless otherwise specified. - Home health to change twice a week OUTER DRESSING ONLY.***** Dressing changes to be completed by [place] on Monday / Wednesday / Friday except when patient has scheduled visit at [facility]. Other Home Health Orders/Instructions: - [facility] home health Wound Treatment Wound #2 - Lower Leg Wound Laterality: Left, Posterior Cleanser: Soap and Water (Home Health) 1 x Per Week/30 Days Discharge Instructions: May shower and wash wound with dial antibacterial soap and water prior to dressing change. Cleanser: Wound Cleanser (Home Health) 1 x Per Week/30 Days Discharge Instructions: Cleanse the wound with wound cleanser prior to applying a clean dressing using gauze sponges, not tissue or cotton balls. Peri-Wound Care: Sween Lotion (Moisturizing lotion) (Generic) 1 x Per Week/30 Days Discharge Instructions: Apply moisturizing lotion as directed Prim Dressing: ADAPTIC TOUCH 3x4.25 (in/in) 1 x Per Week/30 Days ary Discharge Instructions: LEAVE ADAPIC AND STERI-STRIPS IN PLACE. Prim Dressing: apligraf 1 x Per Week/30 Days ary Discharge Instructions: APPLIED BY PROVIDER. LEAVE IN PLACE. Secondary Dressing: ABD Pad, 5x9 (Home Health) 1 x Per Week/30 Days Discharge Instructions: Apply over primary dressing as directed. Secondary Dressing: Woven Gauze Sponge,  Non-Sterile 4x4 in (Home Health) 1 x Per Week/30 Days Discharge Instructions: Apply over primary dressing as directed. Compression [place]: [name] Houston 4.5x3.1 (in/yd) (Home Health) 1 x Per Week/30 Days Discharge Instructions: Apply [name] and [name] compression as directed. Compression [place]: [name] [place] 4x5 (in/yd) (Home Health) 1 x Per Week/30 Days Discharge Instructions: Apply over [name] as directed. Electronic Signature(s) Signed: 04/14/2022 1:27:05 PM By: [name] Shan DO Entered By: [name] [place] on 04/14/2022 13:21:53 [name] Houston ([id number]) [id number] Page 5 of 10 -------------------------------------------------------------------------------- Problem List Details Patient Name: Date of Service: [name] Houston, [name] Houston 04/14/2022 11:00 A M Medical Record Number: [id number] Patient Account Number: [id number] Date of  Birth/Sex: Treating RN: [birth year]Houston (66 y.o. [name] [place]) Primary Care Provider: Harlan Houston Other Clinician: Referring Provider: Treating Provider/Extender: [name] [place] in Treatment: 10 Active Problems ICD-10 Encounter Code Description Active Date MDM Diagnosis I70.248 Atherosclerosis of native arteries of left leg with ulceration of other part of 01/31/2022 No Yes lower leg L97.828 Non-pressure chronic ulcer of other part of left lower leg with other specified 01/31/2022 No Yes severity E11.622 Type 2 diabetes mellitus with other skin ulcer 01/31/2022 No Yes Inactive Problems Resolved Problems Electronic Signature(s) Signed: 04/14/2022 1:27:05 PM By: [name] Shan DO Entered By: [name] [place] on 04/14/2022 13:20:06 -------------------------------------------------------------------------------- Progress Note Details Patient Name: Date of Service: [name] Houston RO N J. 04/14/2022 11:00 A M Medical Record Number: [id number] Patient Account Number: [id number] Date of Birth/Sex: Treating  RN: [birth year]-Dec-Houston (66 y.o. [name] [place]) Primary Care Provider: Harlan [place] Other Clinician: Referring Provider: Treating Provider/Extender: [name] [place] in Treatment: 10 Subjective Chief Complaint Information obtained from Patient 10/20/2021; Left posterior leg wound 01/31/2022; left posterior leg wound History of Present Illness (HPI) Admission 06/26/2021 Ms. Marika Mahaffy is a 66 year old female with a past medical history of uncontrolled type 2 diabetes on oral agents with last hemoglobin A1c of 9, peripheral arterial disease that presents to the clinic for a 3-week history of worsening wound to the posterior aspect of her left leg. She is not sure how it started. She has been using normal saline to the wound bed. She had arterial segmental pressures that showed a left ABI of 0.52 she is scheduled to see vein and vascular on 7/5. She is currently on doxycycline prescribed by her primary care doctor. She currently denies systemic signs of infection. 7/7; patient presents for follow-up. She saw Dr. Donzetta Matters yesterday, vein and vascular to discuss her arterial status. He is scheduling her for an arteriogram for possible intervention. She has had no issues with the Medihoney. [name] Houston, FLEITES ([id number]) [id number] Page 6 of 10 7/24; patient presents for follow-up. She continues to use Medihoney. She is scheduled to have a stress test on 7/28 in order to be cleared for vascular surgery. Plan is for left common femoral endarterectomy and left common femoral to below the knee popliteal artery bypass. Readmission 01/31/2022 Patient had a left iliofemoral endarterectomy with vein patch angioplasty and common femoral to above-the-knee popliteal bypass with PTFE by Dr. Donzetta Matters on 12/06/2021. She had follow-up with vein and vascular on 01/18/2022 and it was noted that her left foot is well-perfused with brisk DP and PT Doppler signals. She has follow-up again with  vein and vascular at the end of this month for bypass duplex and ABIs. She is currently been using Medihoney to the wound bed. She reports improvement to her chronic pain. She currently denies signs of infection. 10/10; patient presents for follow-up. She has been she has been using Dakin's wet-to-dry dressings however she was having pain on dressing changes. We switched to [facility] in the week. She is done better with this. She has no issues or complaints today. 10/17; patient presents for follow-up. She has been using Santyl and Hydrofera Blue to the wound bed. There is been improvement in wound healing. She has no issues or complaints today. 10/31; patient presents for follow-up. She has been using Santyl and Hydrofera Blue to the wound bed. She has no issues or complaints today. She saw vein and vascular on 10/25 they repeated a TBI on the left which was 0.66. She had a bypass graft duplex that  showed biphasic waveforms of throughout the left lower extremity 11/10; patient presents for follow-up. She has recently run out of the [place] and has continued to use Hydrofera Blue to the wound bed. She has no issues or complaints today. 11/16; patient presents for follow-up. She states she still had a little bit of Santyl left in the tube that she was able to use to the wound bed this past week. She is continued with Hydrofera Blue. She has been using the Tubigrip. She has no issues or complaints today. 11/30; patient presents for follow-up. She states she continues to have a little bit of Santyl left and has been using this with Hydrofera Blue. She knows to use Medihoney once this runs out. She has been using Tubigrip. She has no issues or complaints today. 12/5; patient presents for follow-up. She is approved for Apligraf and this was available for placement today. She has no issues or complaints today. She has home health. 12/15; patient presents for follow-up. Apligraf was placed in standard  fashion at last clinic visit under [name]/[name]. She did well with this. The wound is measuring smaller. Patient History Information obtained from Patient, Caregiver. Family History Cancer - Father, Diabetes - Mother,Maternal Grandparents,Father, Hypertension - Mother, No family history of Heart Disease, Hereditary Spherocytosis, Kidney Disease, Lung Disease, Seizures, Stroke, Thyroid Problems, Tuberculosis. Social History Former smoker, Marital Status - Married, Alcohol Use - Rarely, Drug Use - No History, Caffeine Use - Daily. Medical History Eyes Denies history of Cataracts, Glaucoma, Optic Neuritis Cardiovascular Patient has history of Hypertension, Peripheral Arterial Disease, Peripheral Venous Disease Denies history of Angina, Arrhythmia, Coronary Artery Disease, Deep Vein Thrombosis, Hypotension, Myocardial Infarction, Phlebitis, Vasculitis Gastrointestinal Denies history of Cirrhosis , Colitis, Crohnoos, Hepatitis A, Hepatitis B, Hepatitis C Endocrine Patient has history of Type II Diabetes - A1C-9.1 Denies history of Type I Diabetes Immunological Denies history of Lupus Erythematosus, Raynaudoos, Scleroderma Musculoskeletal Patient has history of Osteoarthritis, Osteomyelitis - Hx ankle Denies history of Gout, Rheumatoid Arthritis Neurologic Denies history of Neuropathy, Quadriplegia, Paraplegia, Seizure Disorder Hospitalization/Surgery History - left knee. Medical A Surgical History Notes nd Genitourinary CKD-3 Objective Constitutional respirations regular, non-labored and within target range for patient.. Vitals Time Taken: 11:06 AM, Height: 64 in, Weight: 197 lbs, BMI: 33.8, Temperature: 98.1 [name] [place], Pulse: 67 bpm, Respiratory Rate: 18 breaths/min, Blood Pressure: 156/84 mmHg, Capillary Blood Glucose: 101 mg/dl. Cardiovascular [name] Houston, [name] Houston ([id number]) [id number] Page 7 of 10 2+ dorsalis pedis/posterior tibialis  pulses. Psychiatric pleasant and cooperative. General Notes: Left lower extremity: T the posterior aspect there is an open wound with granulation tissue and hyper granulated areas. No signs of surrounding o infection. Epithelization occurring to the edges circumferentially. Integumentary (Hair, Skin) Wound #2 status is Open. Original cause of wound was Gradually Appeared. The date acquired was: 11/21/2021. The wound has been in treatment 10 [place]. The wound is located on the Left,Posterior Lower Leg. The wound measures 3.7cm length x 3cm width x 0.1cm depth; 8.718cm^2 area and 0.872cm^3 volume. There is Fat Layer (Subcutaneous Tissue) exposed. There is no tunneling or undermining noted. There is a medium amount of serosanguineous drainage noted. The wound margin is distinct with the outline attached to the wound base. There is large (67-100%) red, pink, hyper - granulation within the wound bed. There is no necrotic tissue within the wound bed. The periwound skin appearance did not exhibit: Callus, Crepitus, Excoriation, Induration, Rash, Scarring, Dry/Scaly, Maceration, Atrophie Blanche, Cyanosis, Ecchymosis, Hemosiderin Staining, Mottled, Pallor, Rubor, Erythema. Periwound temperature was noted  as No Abnormality. The periwound has tenderness on palpation. Assessment Active Problems ICD-10 Atherosclerosis of native arteries of left leg with ulceration of other part of lower leg Non-pressure chronic ulcer of other part of left lower leg with other specified severity Type 2 diabetes mellitus with other skin ulcer Patient's wound has shown improvement in size and appearance since last clinic visit. I used silver nitrate to the hyper granulated areas. Apligraf was placed in standard fashion today. Continue compression therapy. Follow-up in 1 week. Procedures Wound #2 Pre-procedure diagnosis of Wound #2 is an Arterial Insufficiency Ulcer located on the Left,Posterior Lower Leg. A skin graft  procedure using a bioengineered skin substitute/cellular or tissue based product was performed by [name] Shan, DO. Apligraf was applied and secured with Steri-Strips and adaptic. 24 sq cm of product was utilized and 20 sq cm was wasted due to wound size. Post Application, no dressing was applied. A Time Out was conducted at 11:40, prior to the start of the procedure. The procedure was tolerated well with a pain level of 0 throughout and a pain level of 0 following the procedure. Post procedure Diagnosis Wound #2: Same as Pre-Procedure . Pre-procedure diagnosis of Wound #2 is an Arterial Insufficiency Ulcer located on the Left,Posterior Lower Leg . An Chemical Cauterization procedure was performed by [name] Shan, DO. Post procedure Diagnosis Wound #2: Same as Pre-Procedure Notes: silver nitrate used. Plan Follow-up Appointments: Return Appointment in 1 week. - Dr. Heber [place] Return Appointment in 2 [place]. - Dr. Heber [place] Anesthetic: (In clinic) Topical Lidocaine 5% applied to wound bed Cellular or Tissue Based Products: Wound #2 Left,Posterior Lower Leg: Cellular or Tissue Based Product Type: - 04/04/2022 Apligraf applied #1. 04/14/2022 apligraf applied #2. Cellular or Tissue Based Product applied to wound bed, secured with steri-strips, cover with Adaptic or Mepitel. (DO NOT REMOVE). Bathing/ Shower/ Hygiene: May shower with protection but do not get wound dressing(s) wet. Edema Control - Lymphedema / SCD / Other: Elevate legs to the level of the heart or above for 30 minutes daily and/or when sitting, a frequency of: - throughout the day Avoid standing for long periods of time. Exercise regularly Additional Orders / Instructions: Follow Nutritious Diet - Monitor/Control Blood Sugars Home Health: New wound care orders this week; continue Home Health for wound care. May utilize formulary equivalent dressing for wound treatment orders unless otherwise specified. - Home health to  change twice a week OUTER DRESSING ONLY.***** Dressing changes to be completed by [place] on Monday / Wednesday / Friday except when patient has scheduled visit at [facility]. Other Home Health Orders/Instructions: - [facility] home health WOUND #2: - Lower Leg Wound Laterality: Left, Posterior Cleanser: Soap and Water (Home Health) 1 x Per Week/30 Days Discharge Instructions: May shower and wash wound with dial antibacterial soap and water prior to dressing change. Cleanser: Wound Cleanser (Home Health) 1 x Per Week/30 Days Discharge Instructions: Cleanse the wound with wound cleanser prior to applying a clean dressing using gauze sponges, not tissue or cotton balls. Peri-Wound Care: Sween Lotion (Moisturizing lotion) (Generic) 1 x Per Week/30 Days LINZY, LAURY ([id number]) [id number] Page 8 of 10 Discharge Instructions: Apply moisturizing lotion as directed Prim Dressing: ADAPTIC TOUCH 3x4.25 (in/in) 1 x Per Week/30 Days ary Discharge Instructions: LEAVE ADAPIC AND STERI-STRIPS IN PLACE. Prim Dressing: apligraf 1 x Per Week/30 Days ary Discharge Instructions: APPLIED BY PROVIDER. LEAVE IN PLACE. Secondary Dressing: ABD Pad, 5x9 (Home Health) 1 x Per Week/30 Days Discharge Instructions: Apply over  primary dressing as directed. Secondary Dressing: Woven Gauze Sponge, Non-Sterile 4x4 in (Home Health) 1 x Per Week/30 Days Discharge Instructions: Apply over primary dressing as directed. Com pression Houston: [name] Houston 4.5x3.1 (in/yd) (Home Health) 1 x Per Week/30 Days Discharge Instructions: Apply [name] and [name] compression as directed. Com pression Houston: [name] Houston 4x5 (in/yd) (Home Health) 1 x Per Week/30 Days Discharge Instructions: Apply over [name] as directed. 1. Apligraf placed in standard fashion 2. Continue [name]/[name] 3. Follow-up in 1 week 4. Silver nitrate Electronic Signature(s) Signed: 04/14/2022 1:27:05 PM By:  [name] Shan DO Entered By: [name] Houston on 04/14/2022 13:22:45 -------------------------------------------------------------------------------- HxROS Details Patient Name: Date of Service: [name] Houston RO N J. 04/14/2022 11:00 A M Medical Record Number: [id number] Patient Account Number: [id number] Date of Birth/Sex: Treating RN: Houston[birth year] (66 y.o. [name] Houston) Primary Care Provider: Harlan Houston Other Clinician: Referring Provider: Treating Provider/Extender: [name] Houston in Treatment: 10 Information Obtained From Patient Caregiver Eyes Medical History: Negative for: Cataracts; Glaucoma; Optic Neuritis Cardiovascular Medical History: Positive for: Hypertension; Peripheral Arterial Disease; Peripheral Venous Disease Negative for: Angina; Arrhythmia; Coronary Artery Disease; Deep Vein Thrombosis; Hypotension; Myocardial Infarction; Phlebitis; Vasculitis Gastrointestinal Medical History: Negative for: Cirrhosis ; Colitis; Crohns; Hepatitis A; Hepatitis B; Hepatitis C Endocrine Medical History: Positive for: Type II Diabetes - A1C-9.1 Negative for: Type I Diabetes Time with diabetes: 12 years Treated with: Oral agents Blood sugar tested every day: No Genitourinary Medical History: Past Medical History Notes: CKD-3 Immunological LESSA, HUGE ([id number]) [id number] Page 9 of 10 Medical History: Negative for: Lupus Erythematosus; Raynauds; Scleroderma Musculoskeletal Medical History: Positive for: Osteoarthritis; Osteomyelitis - Hx ankle Negative for: Gout; Rheumatoid Arthritis Neurologic Medical History: Negative for: Neuropathy; Quadriplegia; Paraplegia; Seizure Disorder Immunizations Pneumococcal Vaccine: Received Pneumococcal Vaccination: No Implantable Devices None Hospitalization / Surgery History Type of Hospitalization/Surgery left knee Family and Social History Cancer: Yes - Father; Diabetes: Yes  - Mother,Maternal Grandparents,Father; Heart Disease: No; Hereditary Spherocytosis: No; Hypertension: Yes - Mother; Kidney Disease: No; Lung Disease: No; Seizures: No; Stroke: No; Thyroid Problems: No; Tuberculosis: No; Former smoker; Marital Status - Married; Alcohol Use: Rarely; Drug Use: No History; Caffeine Use: Daily; Financial Concerns: No; Food, Clothing or Shelter Needs: No; Support System Lacking: No; Transportation Concerns: No Electronic Signature(s) Signed: 04/14/2022 1:27:05 PM By: [name] Shan DO Entered By: [name] Houston on 04/14/2022 13:21:22 -------------------------------------------------------------------------------- SuperBill Details Patient Name: Date of Service: [name] Houston RO N J. 04/14/2022 Medical Record Number: [id number] Patient Account Number: [id number] Date of Birth/Sex: Treating RN: Houston[birth year] (66 y.o. [name] Houston, Meta.Reding Primary Care Provider: Harlan Houston Other Clinician: Referring Provider: Treating Provider/Extender: [name] Houston in Treatment: 10 Diagnosis Coding ICD-10 Codes Code Description [phone] Atherosclerosis of native arteries of left leg with ulceration of other part of lower leg L97.828 Non-pressure chronic ulcer of other part of left lower leg with other specified severity E11.622 Type 2 diabetes mellitus with other skin ulcer Facility Procedures : CPT4 Code: 28413244 Description: (Facility Use Only) Apligraf 1 SQ CM Modifier: Quantity: 44 : CPT4 Code: [id number] Description: 66440 - SKIN SUB GRAFT TRNK/ARM/LEG ICD-10 Diagnosis Description L97.828 Non-pressure chronic ulcer of other part of left lower leg with other specified se E11.622 Type 2 diabetes mellitus with other skin ulcer Modifier: verity Quantity: 1 Physician Procedures HADLEA, FURUYA ([id number]): CPT4 Code Description [id number] 15271 - WC PHYS SKIN SUB GRAFT TRNK/ARM/LEG ICD-10 Diagnosis Description L97.828 Non-pressure chronic ulcer of  other part of left lower leg with ot E11.622 Type 2 diabetes  mellitus with other  skin ulcer [id number] Page 10 of 10: Quantity Modifier 1 her specified severity Electronic Signature(s) Signed: 04/14/2022 1:27:05 PM By: [name] Shan DO Entered By: [name] [place] on 04/14/2022 13:22:54

## 2022-04-17 NOTE — Progress Notes (Signed)
ZOIE, SARIN ([id number]) [id number] Page 1 of 8 Visit Report for 04/14/2022 Arrival Information Details Patient Name: Date of Service: [name] Houston, [name] Houston 04/14/2022 11:00 A M Medical Record Number: [id number] Patient Account Number: [id number] Date of Birth/Sex: Treating RN: 10-18-1955 (66 y.o. F) Primary Care Sigifredo Pignato: Harlan Stains Other Clinician: Referring Neta Upadhyay: Treating Adler Alton/Extender: Jake Church in Treatment: 10 Visit Information History Since Last Visit Added or deleted any medications: No Patient Arrived: Walker Any new allergies or adverse reactions: No Arrival Time: 11:05 Had a fall or experienced change in No Accompanied By: Husband activities of daily living that may affect Transfer Assistance: None risk of falls: Patient Identification Verified: Yes Signs or symptoms of abuse/neglect since last visito No Secondary Verification Process Completed: Yes Hospitalized since last visit: No Patient Requires Transmission-Based Precautions: No Implantable device outside of the clinic excluding No Patient Has Alerts: No cellular tissue based products placed in the center since last visit: Has Compression in Place as Prescribed: Yes Pain Present Now: No Electronic Signature(s) Signed: 04/17/2022 4:19:27 PM By: Erenest Blank Entered By: Erenest Blank on 04/14/2022 11:06:13 -------------------------------------------------------------------------------- Encounter Discharge Information Details Patient Name: Date of Service: [name] Houston. 04/14/2022 11:00 A M Medical Record Number: [id number] Patient Account Number: [id number] Date of Birth/Sex: Treating RN: 01/26/1956 (66 y.o. [name] Primary Care Hennie Gosa: Harlan Stains Other Clinician: Referring Kaysi Ourada: Treating Shadee Montoya/Extender: Jake Church in Treatment: 10 Encounter Discharge Information Items Post  Procedure Vitals Discharge Condition: Stable Temperature (F): 98.1 Ambulatory Status: Walker Pulse (bpm): 67 Discharge Destination: Home Respiratory Rate (breaths/min): 18 Transportation: Private Auto Blood Pressure (mmHg): 156/84 Accompanied By: husband Schedule Follow-up Appointment: Yes Clinical Summary of Care: Electronic Signature(s) Signed: 04/14/2022 2:20:22 PM By: Deon Pilling RN, BSN Entered By: Deon Pilling on 04/14/2022 11:48:56 [name] ([id number]) [id number] Page 2 of 8 -------------------------------------------------------------------------------- Lower Extremity Assessment Details Patient Name: Date of Service: [name] Houston, [name] Houston 04/14/2022 11:00 A M Medical Record Number: [id number] Patient Account Number: [id number] Date of Birth/Sex: Treating RN: [birth year] (66 y.o. F) Primary Care Amore Grater: Harlan Stains Other Clinician: Referring Kingslee Mairena: Treating Darina Hartwell/Extender: [name] Weeks in Treatment: 10 Edema Assessment Assessed: [Left: No] [Right: No] Edema: [Left: N] [Right: o] Calf Left: Right: Point of Measurement: 29 cm From Medial Instep 37.3 cm Ankle Left: Right: Point of Measurement: 7 cm From Medial Instep 21.1 cm Electronic Signature(s) Signed: 04/17/2022 4:19:27 PM By: Erenest Blank Entered By: Erenest Blank on 04/14/2022 11:17:23 -------------------------------------------------------------------------------- Multi Wound Chart Details Patient Name: Date of Service: [name] Houston. 04/14/2022 11:00 A M Medical Record Number: [id number] Patient Account Number: [id number] Date of Birth/Sex: Treating RN: 05-20-1955 (66 y.o. F) Primary Care Xaivier Malay: Harlan Stains Other Clinician: Referring Ortha Metts: Treating Myda Detwiler/Extender: [name] Weeks in Treatment: 10 Vital Signs Height(in): 64 Capillary Blood Glucose(mg/dl): 101 Weight(lbs):  197 Pulse(bpm): 46 Body Mass Index(BMI): 33.8 Blood Pressure(mmHg): 156/84 Temperature(F): 98.1 Respiratory Rate(breaths/min): 18 [2:Photos:] [N/A:N/A] Left, Posterior Lower Leg N/A N/A Wound Location: Gradually Appeared N/A N/A Wounding Event: Arterial Insufficiency Ulcer N/A N/A Primary Etiology: Hypertension, Peripheral Arterial N/A N/A Comorbid History: Disease, Peripheral Venous Disease, Type II Diabetes, Osteoarthritis, [name] Houston, [name] Houston ([id number]) [id number] Page 3 of 8 Osteomyelitis 11/21/2021 N/A N/A Date Acquired: 10 N/A N/A Weeks of Treatment: Open N/A N/A Wound Status: No N/A N/A Wound Recurrence: 3.7x3x0.1 N/A N/A Measurements L x W x D (cm) 8.718 N/A N/A A (cm) : rea 0.872 N/A N/A  Volume (cm) : 75.30% N/A N/A % Reduction in Area: 87.70% N/A N/A % Reduction in Volume: Full Thickness Without Exposed N/A N/A Classification: Support Structures Medium N/A N/A Exudate Amount: Serosanguineous N/A N/A Exudate Type: red, brown N/A N/A Exudate Color: Distinct, outline attached N/A N/A Wound Margin: Large (67-100%) N/A N/A Granulation Amount: Red, Pink, Hyper-granulation N/A N/A Granulation Quality: None Present (0%) N/A N/A Necrotic Amount: Fat Layer (Subcutaneous Tissue): Yes N/A N/A Exposed Structures: Fascia: No Tendon: No Muscle: No Joint: No Bone: No Medium (34-66%) N/A N/A Epithelialization: Excoriation: No N/A N/A Periwound Skin Texture: Induration: No Callus: No Crepitus: No Rash: No Scarring: No Maceration: No N/A N/A Periwound Skin Moisture: Dry/Scaly: No Atrophie Blanche: No N/A N/A Periwound Skin Color: Cyanosis: No Ecchymosis: No Erythema: No Hemosiderin Staining: No Mottled: No Pallor: No Rubor: No No Abnormality N/A N/A Temperature: Yes N/A N/A Tenderness on Palpation: Cellular or Tissue Based Product N/A N/A Procedures Performed: Chemical Cauterization Treatment Notes Wound  #2 (Lower Leg) Wound Laterality: Left, Posterior Cleanser Soap and Water Discharge Instruction: May shower and wash wound with dial antibacterial soap and water prior to dressing change. Wound Cleanser Discharge Instruction: Cleanse the wound with wound cleanser prior to applying a clean dressing using gauze sponges, not tissue or cotton balls. Peri-Wound Care Sween Lotion (Moisturizing lotion) Discharge Instruction: Apply moisturizing lotion as directed Topical Primary Dressing ADAPTIC TOUCH 3x4.25 (in/in) Discharge Instruction: LEAVE ADAPIC AND STERI-STRIPS IN PLACE. apligraf Discharge Instruction: APPLIED BY Ulysses Alper. LEAVE IN PLACE. Secondary Dressing ABD Pad, 5x9 Discharge Instruction: Apply over primary dressing as directed. Woven Gauze Sponge, Non-Sterile 4x4 in Discharge Instruction: Apply over primary dressing as directed. Secured With Compression Wrap Kerlix Roll 4.5x3.1 (in/yd) Discharge Instruction: Apply Kerlix and Coban compression as directed. KEYONDA, BICKLE ([id number]) [id number] Page 4 of 8 Coban Self-Adherent Wrap 4x5 (in/yd) Discharge Instruction: Apply over Kerlix as directed. Compression Stockings Add-Ons Electronic Signature(s) Signed: 04/14/2022 1:27:05 PM By: Kalman Shan DO Entered By: Kalman Shan on 04/14/2022 13:20:11 -------------------------------------------------------------------------------- Multi-Disciplinary Care Plan Details Patient Name: Date of Service: [name] [place]. 04/14/2022 11:00 A M Medical Record Number: [id number] Patient Account Number: [id number] Date of Birth/Sex: Treating RN: [birth year] (66 y.o. Helene Shoe, Tammi Klippel Primary Care Chanley Mcenery: Harlan Stains Other Clinician: Referring Anjelita Sheahan: Treating Heidi Lemay/Extender: [name] Weeks in Treatment: 10 Active Inactive Wound/Skin Impairment Nursing Diagnoses: Impaired tissue integrity Knowledge deficit related  to ulceration/compromised skin integrity Goals: Patient will have a decrease in wound volume by X% from date: (specify in notes) Date Initiated: 01/31/2022 Target Resolution Date: 06/01/2022 Goal Status: Active Patient/caregiver will verbalize understanding of skin care regimen Date Initiated: 01/31/2022 Target Resolution Date: 06/01/2022 Goal Status: Active Ulcer/skin breakdown will have a volume reduction of 30% by week 4 Date Initiated: 01/31/2022 Date Inactivated: 02/28/2022 Target Resolution Date: 03/03/2022 Unmet Reason: 19.9% healed in area Goal Status: Unmet and volume. Continue wound care treatment. Interventions: Assess patient/caregiver ability to obtain necessary supplies Assess patient/caregiver ability to perform ulcer/skin care regimen upon admission and as needed Assess ulceration(s) every visit Notes: Electronic Signature(s) Signed: 04/14/2022 2:20:22 PM By: Deon Pilling RN, BSN Entered By: Deon Pilling on 04/14/2022 10:55:18 -------------------------------------------------------------------------------- Pain Assessment Details Patient Name: Date of Service: [name] [place]. 04/14/2022 11:00 A M Medical Record Number: [id number] Patient Account Number: [id number] MONTINA, DORRANCE ([id number]) [phone].pdf Page 5 of 8 Date of Birth/Sex: Treating RN: 1955/08/20 (66 y.o. F) Primary Care Evelyne Makepeace: Harlan Stains Other Clinician: Referring Cheree Fowles: Treating Jenness Stemler/Extender: [name]  Weeks in Treatment: 10 Active Problems Location of Pain Severity and Description of Pain Patient Has Paino No Site Locations Pain Management and Medication Current Pain Management: Electronic Signature(s) Signed: 04/17/2022 4:19:27 PM By: Erenest Blank Entered By: Erenest Blank on 04/14/2022 11:17:09 -------------------------------------------------------------------------------- Patient/Caregiver Education Details Patient  Name: Date of Service: [name] [place] 12/15/2023andnbsp11:00 A M Medical Record Number: [id number] Patient Account Number: [id number] Date of Birth/Gender: Treating RN: 1955-11-05 (66 y.o. [name] Primary Care Physician: Harlan Stains Other Clinician: Referring Physician: Treating Physician/Extender: Jake Church in Treatment: 10 Education Assessment Education Provided To: Patient Education Topics Provided Wound/Skin Impairment: Handouts: Skin Care Do's and Dont's Methods: Explain/Verbal Responses: Reinforcements needed Electronic Signature(s) Signed: 04/14/2022 2:20:22 PM By: Deon Pilling RN, BSN Entered By: Deon Pilling on 04/14/2022 10:55:29 [name] ([id number]) [id number] Page 6 of 8 -------------------------------------------------------------------------------- Wound Assessment Details Patient Name: Date of Service: [name] Houston, [name] Houston 04/14/2022 11:00 A M Medical Record Number: [id number] Patient Account Number: [id number] Date of Birth/Sex: Treating RN: 11/28/1955 (66 y.o. F) Primary Care Adrionna Delcid: Harlan Stains Other Clinician: Referring Meggin Ola: Treating Aurora Rody/Extender: [name] Weeks in Treatment: 10 Wound Status Wound Number: 2 Primary Arterial Insufficiency Ulcer Etiology: Wound Location: Left, Posterior Lower Leg Wound Open Wounding Event: Gradually Appeared Status: Date Acquired: 11/21/2021 Comorbid Hypertension, Peripheral Arterial Disease, Peripheral Venous Weeks Of Treatment: 10 History: Disease, Type II Diabetes, Osteoarthritis, Osteomyelitis Clustered Wound: No Photos Wound Measurements Length: (cm) 3.7 Width: (cm) 3 Depth: (cm) 0.1 Area: (cm) 8.718 Volume: (cm) 0.872 % Reduction in Area: 75.3% % Reduction in Volume: 87.7% Epithelialization: Medium (34-66%) Tunneling: No Undermining: No Wound Description Classification: Full  Thickness Without Exposed Suppor Wound Margin: Distinct, outline attached Exudate Amount: Medium Exudate Type: Serosanguineous Exudate Color: red, brown t Structures Foul Odor After Cleansing: No Slough/Fibrino Yes Wound Bed Granulation Amount: Large (67-100%) Exposed Structure Granulation Quality: Red, Pink, Hyper-granulation Fascia Exposed: No Necrotic Amount: None Present (0%) Fat Layer (Subcutaneous Tissue) Exposed: Yes Tendon Exposed: No Muscle Exposed: No Joint Exposed: No Bone Exposed: No Periwound Skin Texture Texture Color No Abnormalities Noted: No No Abnormalities Noted: No Callus: No Atrophie Blanche: No Crepitus: No Cyanosis: No Excoriation: No Ecchymosis: No Induration: No Erythema: No Rash: No Hemosiderin Staining: No Scarring: No Mottled: No Pallor: No Moisture Rubor: No No Abnormalities Noted: No Dry / Scaly: No Temperature / Pain Maceration: No Temperature: No Abnormality Tenderness on Palpation: Yes [name] Houston, [name] Houston ([id number]) [id number] Page 7 of 8 Treatment Notes Wound #2 (Lower Leg) Wound Laterality: Left, Posterior Cleanser Soap and Water Discharge Instruction: May shower and wash wound with dial antibacterial soap and water prior to dressing change. Wound Cleanser Discharge Instruction: Cleanse the wound with wound cleanser prior to applying a clean dressing using gauze sponges, not tissue or cotton balls. Peri-Wound Care Sween Lotion (Moisturizing lotion) Discharge Instruction: Apply moisturizing lotion as directed Topical Primary Dressing ADAPTIC TOUCH 3x4.25 (in/in) Discharge Instruction: LEAVE ADAPIC AND STERI-STRIPS IN PLACE. apligraf Discharge Instruction: APPLIED BY Signa Cheek. LEAVE IN PLACE. Secondary Dressing ABD Pad, 5x9 Discharge Instruction: Apply over primary dressing as directed. Woven Gauze Sponge, Non-Sterile 4x4 in Discharge Instruction: Apply over primary dressing as directed. Secured  With Compression Wrap Kerlix Roll 4.5x3.1 (in/yd) Discharge Instruction: Apply Kerlix and Coban compression as directed. Coban Self-Adherent Wrap 4x5 (in/yd) Discharge Instruction: Apply over Kerlix as directed. Compression Stockings Add-Ons Electronic Signature(s) Signed: 04/17/2022 4:19:27 PM By: Erenest Blank Entered By: Erenest Blank on 04/14/2022 11:18:55 -------------------------------------------------------------------------------- Vitals Details  Patient Name: Date of Service: [name] Houston, [name] Houston 04/14/2022 11:00 A M Medical Record Number: [id number] Patient Account Number: [id number] Date of Birth/Sex: Treating RN: Sep 08, 1955 (66 y.o. F) Primary Care Kolbee Bogusz: Harlan Stains Other Clinician: Referring Sholom Dulude: Treating Kaena Santori/Extender: Jake Church in Treatment: 10 Vital Signs Time Taken: 11:06 Temperature (F): 98.1 Height (in): 64 Pulse (bpm): 67 Weight (lbs): 197 Respiratory Rate (breaths/min): 18 Body Mass Index (BMI): 33.8 Blood Pressure (mmHg): 156/84 Capillary Blood Glucose (mg/dl): 101 Reference Range: 80 - 120 mg / dl Electronic Signature(s) Signed: 04/17/2022 4:19:27 PM By: Britt Bottom, [name] ([id number]) By: Erenest Blank [phone].pdf Page 8 of 8 Signed: 04/17/2022 4:19:27 PM Entered By: Erenest Blank on 04/14/2022 11:08:04

## 2022-04-20 ENCOUNTER — Encounter (HOSPITAL_BASED_OUTPATIENT_CLINIC_OR_DEPARTMENT_OTHER): Payer: HMO | Admitting: Internal Medicine

## 2022-04-27 ENCOUNTER — Encounter (HOSPITAL_BASED_OUTPATIENT_CLINIC_OR_DEPARTMENT_OTHER): Payer: HMO | Admitting: Internal Medicine

## 2022-04-27 DIAGNOSIS — I70248 Atherosclerosis of native arteries of left leg with ulceration of other part of lower left leg: Secondary | ICD-10-CM | POA: Diagnosis not present

## 2022-04-27 DIAGNOSIS — E11622 Type 2 diabetes mellitus with other skin ulcer: Secondary | ICD-10-CM

## 2022-04-27 DIAGNOSIS — L97828 Non-pressure chronic ulcer of other part of left lower leg with other specified severity: Secondary | ICD-10-CM

## 2022-04-27 NOTE — Progress Notes (Signed)
IDORA, BROSIOUS ([id number]) [id number] Page 1 of 8 Visit Report for 04/27/2022 Arrival Information Details Patient Name: Date of Service: [name] Houston, [name] Houston 04/27/2022 3:15 PM Medical Record Number: [id number] Patient Account Number: [id number] Date of Birth/Sex: Treating RN: [birth year] (66 y.o. F) Primary Care Forever Arechiga: Harlan Stains Other Clinician: Referring Sargent Mankey: Treating Lamyiah Crawshaw/Extender: Jake Church in Treatment: 12 Visit Information History Since Last Visit Added or deleted any medications: No Patient Arrived: Walker Any new allergies or adverse reactions: No Arrival Time: 15:16 Had a fall or experienced change in No Accompanied By: husband activities of daily living that may affect Transfer Assistance: None risk of falls: Patient Identification Verified: Yes Signs or symptoms of abuse/neglect since last visito No Secondary Verification Process Completed: Yes Hospitalized since last visit: No Patient Requires Transmission-Based Precautions: No Implantable device outside of the clinic excluding No Patient Has Alerts: No cellular tissue based products placed in the center since last visit: Has Compression in Place as Prescribed: Yes Pain Present Now: No Electronic Signature(s) Signed: 04/27/2022 4:51:54 PM By: Erenest Blank Entered By: Erenest Blank on 04/27/2022 15:16:41 -------------------------------------------------------------------------------- Encounter Discharge Information Details Patient Name: Date of Service: [name] Houston RO N J. 04/27/2022 3:15 PM Medical Record Number: [id number] Patient Account Number: [id number] Date of Birth/Sex: Treating RN: [birth year] (66 y.o. [name], [name] Primary Care Genesis Paget: Harlan Stains Other Clinician: Referring [name] Houston: Treating Jermar Colter/Extender: Jake Church in Treatment: 12 Encounter Discharge Information Items Post  Procedure Vitals Discharge Condition: Stable Temperature (F): 98.7 Ambulatory Status: Ambulatory Pulse (bpm): 74 Discharge Destination: Home Respiratory Rate (breaths/min): 17 Transportation: Private Auto Blood Pressure (mmHg): 134/74 Accompanied By: self Schedule Follow-up Appointment: Yes Clinical Summary of Care: Patient Declined Electronic Signature(s) Signed: 04/27/2022 4:52:17 PM By: Rhae Hammock RN Entered By: Rhae Hammock on 04/27/2022 15:59:13 [name] Houston ([id number]) [id number] Page 2 of 8 -------------------------------------------------------------------------------- Lower Extremity Assessment Details Patient Name: Date of Service: [name] Houston 04/27/2022 3:15 PM Medical Record Number: [id number] Patient Account Number: [id number] Date of Birth/Sex: Treating RN: 16-Nov-1955 (66 y.o. F) Primary Care Antwan Pandya: Harlan Stains Other Clinician: Referring Thatiana Renbarger: Treating Knut Rondinelli/Extender: [name] Weeks in Treatment: 12 Edema Assessment Assessed: [Left: No] [Right: No] Edema: [Left: N] [Right: o] Calf Left: Right: Point of Measurement: 29 cm From Medial Instep 37.5 cm Ankle Left: Right: Point of Measurement: 7 cm From Medial Instep 22 cm Electronic Signature(s) Signed: 04/27/2022 4:51:54 PM By: Erenest Blank Entered By: Erenest Blank on 04/27/2022 15:33:20 -------------------------------------------------------------------------------- Multi Wound Chart Details Patient Name: Date of Service: [name] Houston. 04/27/2022 3:15 PM Medical Record Number: [id number] Patient Account Number: [id number] Date of Birth/Sex: Treating RN: Dec 04, 1955 (66 y.o. F) Primary Care Dynisha Due: Harlan Stains Other Clinician: Referring Vara Mairena: Treating Kedron Uno/Extender: Jake Church in Treatment: 12 Vital Signs Height(in): 64 Capillary Blood Glucose(mg/dl):  123 Weight(lbs): 197 Pulse(bpm): 60 Body Mass Index(BMI): 33.8 Blood Pressure(mmHg): 136/72 Temperature(F): 97.7 Respiratory Rate(breaths/min): 18 [2:Photos:] [N/A:N/A] Left, Posterior Lower Leg N/A N/A Wound Location: Gradually Appeared N/A N/A Wounding Event: Arterial Insufficiency Ulcer N/A N/A Primary Etiology: Hypertension, Peripheral Arterial N/A N/A Comorbid History: Disease, Peripheral Venous Disease, Type II Diabetes, Osteoarthritis, [name] Houston ([id number]) [id number] Page 3 of 8 Osteomyelitis 11/21/2021 N/A N/A Date Acquired: 12 N/A N/A Weeks of Treatment: Open N/A N/A Wound Status: No N/A N/A Wound Recurrence: 3.4x2x0.1 N/A N/A Measurements L x W x D (cm) 5.341 N/A N/A A (cm) : rea 0.534 N/A N/A Volume (cm) :  84.90% N/A N/A % Reduction in Area: 92.40% N/A N/A % Reduction in Volume: Full Thickness Without Exposed N/A N/A Classification: Support Structures Medium N/A N/A Exudate A mount: Serosanguineous N/A N/A Exudate Type: red, brown N/A N/A Exudate Color: Distinct, outline attached N/A N/A Wound Margin: Medium (34-66%) N/A N/A Granulation A mount: Red, Pink, Hyper-granulation N/A N/A Granulation Quality: Medium (34-66%) N/A N/A Necrotic A mount: Fat Layer (Subcutaneous Tissue): Yes N/A N/A Exposed Structures: Fascia: No Tendon: No Muscle: No Joint: No Bone: No Medium (34-66%) N/A N/A Epithelialization: Debridement - Excisional N/A N/A Debridement: Pre-procedure Verification/Time Out 15:50 N/A N/A Taken: Lidocaine N/A N/A Pain Control: Subcutaneous, Slough N/A N/A Tissue Debrided: Skin/Subcutaneous Tissue N/A N/A Level: 6.8 N/A N/A Debridement A (sq cm): rea Curette N/A N/A Instrument: Minimum N/A N/A Bleeding: Pressure N/A N/A Hemostasis A chieved: 0 N/A N/A Procedural Pain: 0 N/A N/A Post Procedural Pain: Procedure was tolerated well N/A N/A Debridement Treatment  Response: 3.4x2x0.1 N/A N/A Post Debridement Measurements L x W x D (cm) 0.534 N/A N/A Post Debridement Volume: (cm) Excoriation: No N/A N/A Periwound Skin Texture: Induration: No Callus: No Crepitus: No Rash: No Scarring: No Maceration: No N/A N/A Periwound Skin Moisture: Dry/Scaly: No Atrophie Blanche: No N/A N/A Periwound Skin Color: Cyanosis: No Ecchymosis: No Erythema: No Hemosiderin Staining: No Mottled: No Pallor: No Rubor: No No Abnormality N/A N/A Temperature: Yes N/A N/A Tenderness on Palpation: Cellular or Tissue Based Product N/A N/A Procedures Performed: Debridement Treatment Notes Wound #2 (Lower Leg) Wound Laterality: Left, Posterior Cleanser Soap and Water Discharge Instruction: May shower and wash wound with dial antibacterial soap and water prior to dressing change. Wound Cleanser Discharge Instruction: Cleanse the wound with wound cleanser prior to applying a clean dressing using gauze sponges, not tissue or cotton balls. Peri-Wound Care Sween Lotion (Moisturizing lotion) Discharge Instruction: Apply moisturizing lotion as directed Topical Primary Dressing ADAPTIC TOUCH 3x4.25 (in/in) Discharge Instruction: LEAVE ADAPIC AND STERI-STRIPS IN PLACE. [address] [facility] ([id number]) [id number] Page 4 of 8 Discharge Instruction: APPLIED BY Rae Plotner. LEAVE IN PLACE. Secondary Dressing ABD Pad, 5x9 Discharge Instruction: Apply over primary dressing as directed. Woven Gauze Sponge, Non-Sterile 4x4 in Discharge Instruction: Apply over primary dressing as directed. Secured With Compression Wrap Kerlix Roll 4.5x3.1 (in/yd) Discharge Instruction: Apply Kerlix and Coban compression as directed. Coban Self-Adherent Wrap 4x5 (in/yd) Discharge Instruction: Apply over Kerlix as directed. Compression Stockings Add-Ons Electronic Signature(s) Signed: 04/27/2022 4:25:19 PM By: Kalman Shan DO Entered By: Kalman Shan on 04/27/2022 16:21:50 -------------------------------------------------------------------------------- Multi-Disciplinary Care Plan Details Patient Name: Date of Service: [name] Houston RO N J. 04/27/2022 3:15 PM Medical Record Number: [id number] Patient Account Number: [id number] Date of Birth/Sex: Treating RN: March 29, 1956 (66 y.o. [name], [name] Primary Care Adriene Knipfer: Harlan Stains Other Clinician: Referring Ash Mcelwain: Treating Ashyia Schraeder/Extender: [name] Weeks in Treatment: 12 Active Inactive Wound/Skin Impairment Nursing Diagnoses: Impaired tissue integrity Knowledge deficit related to ulceration/compromised skin integrity Goals: Patient will have a decrease in wound volume by X% from date: (specify in notes) Date Initiated: 01/31/2022 Target Resolution Date: 06/01/2022 Goal Status: Active Patient/caregiver will verbalize understanding of skin care regimen Date Initiated: 01/31/2022 Target Resolution Date: 06/01/2022 Goal Status: Active Ulcer/skin breakdown will have a volume reduction of 30% by week 4 Date Initiated: 01/31/2022 Date Inactivated: 02/28/2022 Target Resolution Date: 03/03/2022 Unmet Reason: 19.9% healed in area Goal Status: Unmet and volume. Continue wound care treatment. Interventions: Assess patient/caregiver ability to obtain necessary supplies Assess patient/caregiver ability to perform ulcer/skin care regimen  upon admission and as needed Assess ulceration(s) every visit Notes: Electronic Signature(s) Signed: 04/27/2022 4:52:17 PM By: Rhae Hammock RN Creig Hines, [name] ([id number]) By: Rhae Hammock RN [phone].pdf Page 5 of 8 Signed: 04/27/2022 4:52:17 PM Entered By: Rhae Hammock on 04/27/2022 15:36:38 -------------------------------------------------------------------------------- Pain Assessment Details Patient Name: Date of Service: [name], Houston 04/27/2022 3:15  PM Medical Record Number: [id number] Patient Account Number: [id number] Date of Birth/Sex: Treating RN: [birth year] (66 y.o. F) Primary Care Gerturde Kuba: Harlan Stains Other Clinician: Referring Madelynne Lasker: Treating Adryen Cookson/Extender: Jake Church in Treatment: 12 Active Problems Location of Pain Severity and Description of Pain Patient Has Paino No Site Locations Pain Management and Medication Current Pain Management: Electronic Signature(s) Signed: 04/27/2022 4:51:54 PM By: Erenest Blank Entered By: Erenest Blank on 04/27/2022 15:33:03 -------------------------------------------------------------------------------- Patient/Caregiver Education Details Patient Name: Date of Service: [name] Houston 12/28/2023andnbsp3:15 PM Medical Record Number: [id number] Patient Account Number: [id number] Date of Birth/Gender: Treating RN: 11-01-1955 (66 y.o. [name], [name] Primary Care Physician: Harlan Stains Other Clinician: Referring Physician: Treating Physician/Extender: Jake Church in Treatment: 12 Education Assessment Education Provided To: Patient Education Topics Provided AZAELA, CARACCI ([id number]) [id number] Page 6 of 8 Wound/Skin Impairment: Methods: Explain/Verbal Responses: Reinforcements needed, State content correctly Electronic Signature(s) Signed: 04/27/2022 4:52:17 PM By: Rhae Hammock RN Entered By: Rhae Hammock on 04/27/2022 15:36:54 -------------------------------------------------------------------------------- Wound Assessment Details Patient Name: Date of Service: [name] Houston. 04/27/2022 3:15 PM Medical Record Number: [id number] Patient Account Number: [id number] Date of Birth/Sex: Treating RN: [birth year] (66 y.o. F) Primary Care Joanthan Hlavacek: Harlan Stains Other Clinician: Referring Harnoor Kohles: Treating Tron Flythe/Extender: [name] Weeks in Treatment: 12 Wound Status Wound Number: 2 Primary Arterial Insufficiency Ulcer Etiology: Wound Location: Left, Posterior Lower Leg Wound Open Wounding Event: Gradually Appeared Status: Date Acquired: 11/21/2021 Comorbid Hypertension, Peripheral Arterial Disease, Peripheral Venous Weeks Of Treatment: 12 History: Disease, Type II Diabetes, Osteoarthritis, Osteomyelitis Clustered Wound: No Photos Wound Measurements Length: (cm) 3.4 Width: (cm) 2 Depth: (cm) 0.1 Area: (cm) 5.341 Volume: (cm) 0.534 % Reduction in Area: 84.9% % Reduction in Volume: 92.4% Epithelialization: Medium (34-66%) Tunneling: No Undermining: No Wound Description Classification: Full Thickness Without Exposed Suppor Wound Margin: Distinct, outline attached Exudate Amount: Medium Exudate Type: Serosanguineous Exudate Color: red, brown t Structures Foul Odor After Cleansing: No Slough/Fibrino Yes Wound Bed Granulation Amount: Medium (34-66%) Exposed Structure Granulation Quality: Red, Pink, Hyper-granulation Fascia Exposed: No Necrotic Amount: Medium (34-66%) Fat Layer (Subcutaneous Tissue) Exposed: Yes Necrotic Quality: Adherent Slough Tendon Exposed: No Muscle Exposed: No Joint Exposed: No Bone Exposed: No [address] KATRIANNA, FRIESENHAHN ([id number]) [id number] Page 7 of 8 No Abnormalities Noted: No No Abnormalities Noted: No Callus: No Atrophie Blanche: No Crepitus: No Cyanosis: No Excoriation: No Ecchymosis: No Induration: No Erythema: No Rash: No Hemosiderin Staining: No Scarring: No Mottled: No Pallor: No Moisture Rubor: No No Abnormalities Noted: No Dry / Scaly: No Temperature / Pain Maceration: No Temperature: No Abnormality Tenderness on Palpation: Yes Treatment Notes Wound #2 (Lower Leg) Wound Laterality: Left, Posterior Cleanser Soap and Water Discharge Instruction: May shower and wash wound with  dial antibacterial soap and water prior to dressing change. Wound Cleanser Discharge Instruction: Cleanse the wound with wound cleanser prior to applying a clean dressing using gauze sponges, not tissue or cotton balls. Peri-Wound Care Sween Lotion (Moisturizing lotion) Discharge Instruction: Apply moisturizing lotion as directed Topical Primary Dressing ADAPTIC TOUCH 3x4.25 (in/in) Discharge Instruction: LEAVE ADAPIC AND  STERI-STRIPS IN PLACE. apligraf Discharge Instruction: APPLIED BY Glorya Bartley. LEAVE IN PLACE. Secondary Dressing ABD Pad, 5x9 Discharge Instruction: Apply over primary dressing as directed. Woven Gauze Sponge, Non-Sterile 4x4 in Discharge Instruction: Apply over primary dressing as directed. Secured With Compression Wrap Kerlix Roll 4.5x3.1 (in/yd) Discharge Instruction: Apply Kerlix and Coban compression as directed. Coban Self-Adherent Wrap 4x5 (in/yd) Discharge Instruction: Apply over Kerlix as directed. Compression Stockings Add-Ons Electronic Signature(s) Signed: 04/27/2022 4:51:54 PM By: Erenest Blank Entered By: Erenest Blank on 04/27/2022 15:34:59 -------------------------------------------------------------------------------- Vitals Details Patient Name: Date of Service: [name] Houston RO N J. 04/27/2022 3:15 PM Medical Record Number: [id number] Patient Account Number: [id number] Date of Birth/Sex: Treating RN: [birth year] (66 y.o. F) Primary Care Ibtisam Benge: Harlan Stains Other Clinician: Referring Britiney Blahnik: Treating Marcel Sorter/Extender: Jake Church in Treatment: [address], [place] ([id number]) [id number] Page 8 of 8 Vital Signs Time Taken: 15:16 Temperature (F): 97.7 Height (in): 64 Pulse (bpm): 60 Weight (lbs): 197 Respiratory Rate (breaths/min): 18 Body Mass Index (BMI): 33.8 Blood Pressure (mmHg): 136/72 Capillary Blood Glucose (mg/dl): 123 Reference Range: 80 - 120 mg / dl Electronic  Signature(s) Signed: 04/27/2022 4:51:54 PM By: Erenest Blank Entered By: Erenest Blank on 04/27/2022 15:18:54

## 2022-04-27 NOTE — Progress Notes (Signed)
AKIA, [name] Houston ([id number]) [id number] Page 1 of 10 Visit Report for 04/27/2022 Chief Complaint Document Details Patient Name: Date of Service: [name] Houston, [name] Houston 04/27/2022 3:15 PM Medical Record Number: [id number] Patient Account Number: [id number] Date of Birth/Sex: Treating RN: Jul 31, 1955 (66 y.o. F) Primary Care Provider: Harlan Stains Other Clinician: Referring Provider: Treating Provider/Extender: Jake Church in Treatment: 12 Information Obtained from: Patient Chief Complaint 10/20/2021; Left posterior leg wound 01/31/2022; left posterior leg wound Electronic Signature(s) Signed: 04/27/2022 4:25:19 PM By: Kalman Shan DO Entered By: Kalman Shan on 04/27/2022 16:21:57 -------------------------------------------------------------------------------- Cellular or Tissue Based Product Details Patient Name: Date of Service: [name] Houston. 04/27/2022 3:15 PM Medical Record Number: [id number] Patient Account Number: [id number] Date of Birth/Sex: Treating RN: [birth year] (66 y.o. [name] Houston, [name] Houston Primary Care Provider: Harlan Stains Other Clinician: Referring Provider: Treating Provider/Extender: Jake Church in Treatment: 12 Cellular or Tissue Based Product Type Wound #2 Left,Posterior Lower Leg Applied to: Performed By: Physician Kalman Shan, DO Cellular or Tissue Based Product Type: Apligraf Level of Consciousness (Pre-procedure): Awake and Alert Pre-procedure Verification/Time Out Yes - 15:53 Taken: Location: trunk / arms / legs Wound Size (sq cm): 6.8 Product Size (sq cm): 44 Waste Size (sq cm): 22 Waste Reason: wound size Amount of Product Applied (sq cm): 22 Lot #: [id number] Expiration Date: 05/03/2022 Fenestrated: Yes Instrument: Blade Reconstituted: Yes Solution Type: normal saline Solution Amount: 81m Lot #: [id number] Expiration Date:  09/28/2024 Secured: Yes Secured With: Steri-Strips Dressing Applied: Yes Primary Dressing: adaptic. gauze Procedural Pain: 0 Post Procedural Pain: 0 Response to Treatment: Procedure was tolerated well JRAPHAEL, ESPE([id number] Page 2 of 10 Level of Consciousness (Post- Awake and Alert procedure): Post Procedure Diagnosis Same as Pre-procedure Electronic Signature(s) Signed: 04/27/2022 4:25:19 PM By: HKalman ShanDO Signed: 04/27/2022 4:52:17 PM By: BRhae HammockRN Entered By: BRhae Hammockon 04/27/2022 15:57:38 -------------------------------------------------------------------------------- Debridement Details Patient Name: Date of Service: [name] CavalierRO N J. 04/27/2022 3:15 PM Medical Record Number: [id number] Account Number: [id number] of Birth/Sex: Treating RN: 4January 03, 1957(66y.o. FTonita Houston [name] Houston Primary Care Provider: WHarlan StainsOther Clinician: Referring Provider: Treating Provider/Extender: HJake Churchin Treatment: 12 Debridement Performed for Assessment: Wound #2 Left,Posterior Lower Leg Performed By: Physician HKalman Shan DO Debridement Type: Debridement Severity of Tissue Pre Debridement: Fat layer exposed Level of Consciousness (Pre-procedure): Awake and Alert Pre-procedure Verification/Time Out Yes - 15:50 Taken: Start Time: 15:50 Pain Control: Lidocaine T Area Debrided (L x W): otal 3.4 (cm) x 2 (cm) = 6.8 (cm) Tissue and other material debrided: Viable, Non-Viable, Slough, Subcutaneous, Slough Level: Skin/Subcutaneous Tissue Debridement Description: Excisional Instrument: Curette Bleeding: Minimum Hemostasis Achieved: Pressure End Time: 15:50 Procedural Pain: 0 Post Procedural Pain: 0 Response to Treatment: Procedure was tolerated well Level of Consciousness (Post- Awake and Alert procedure): Post Debridement Measurements of Total  Wound Length: (cm) 3.4 Width: (cm) 2 Depth: (cm) 0.1 Volume: (cm) 0.534 Character of Wound/Ulcer Post Debridement: Improved Severity of Tissue Post Debridement: Fat layer exposed Post Procedure Diagnosis Same as Pre-procedure Electronic Signature(s) Signed: 04/27/2022 4:25:19 PM By: HKalman ShanDO Signed: 04/27/2022 4:52:17 PM By: BRhae HammockRN Entered By: BRhae Hammockon 04/27/2022 15:50:37 JSander Nephew([id number]) [id number] Page 3 of 10 -------------------------------------------------------------------------------- HPI Details Patient Name: Date of Service: [name], [name]12/28/2023 3:15 PM Medical Record Number: [id number] Account Number: [id number] of Birth/Sex: Treating RN: 4May 25, 1957(66y.o. F) Primary  Care Provider: Harlan Stains Other Clinician: Referring Provider: Treating Provider/Extender: Jake Church in Treatment: 12 History of Present Illness HPI Description: Admission 06/26/2021 [name] is a 66 year old female with a past medical history of uncontrolled type 2 diabetes on oral agents with last hemoglobin A1c of 9, peripheral arterial disease that presents to the clinic for a 3-week history of worsening wound to the posterior aspect of her left leg. She is not sure how it started. She has been using normal saline to the wound bed. She had arterial segmental pressures that showed a left ABI of 0.52 she is scheduled to see vein and vascular on 7/5. She is currently on doxycycline prescribed by her primary care doctor. She currently denies systemic signs of infection. 7/7; patient presents for follow-up. She saw Dr. Donzetta Matters yesterday, vein and vascular to discuss her arterial status. He is scheduling her for an arteriogram for possible intervention. She has had no issues with the Medihoney. 7/24; patient presents for follow-up. She continues to use Medihoney. She is  scheduled to have a stress test on 7/28 in order to be cleared for vascular surgery. Plan is for left common femoral endarterectomy and left common femoral to below the knee popliteal artery bypass. Readmission 01/31/2022 Patient had a left iliofemoral endarterectomy with vein patch angioplasty and common femoral to above-the-knee popliteal bypass with PTFE by Dr. Donzetta Matters on 12/06/2021. She had follow-up with vein and vascular on 01/18/2022 and it was noted that her left foot is well-perfused with brisk DP and PT Doppler signals. She has follow-up again with vein and vascular at the end of this month for bypass duplex and ABIs. She is currently been using Medihoney to the wound bed. She reports improvement to her chronic pain. She currently denies signs of infection. 10/10; patient presents for follow-up. She has been she has been using Dakin's wet-to-dry dressings however she was having pain on dressing changes. We switched to [facility] in the week. She is done better with this. She has no issues or complaints today. 10/17; patient presents for follow-up. She has been using Santyl and Hydrofera Blue to the wound bed. There is been improvement in wound healing. She has no issues or complaints today. 10/31; patient presents for follow-up. She has been using Santyl and Hydrofera Blue to the wound bed. She has no issues or complaints today. She saw vein and vascular on 10/25 they repeated a TBI on the left which was 0.66. She had a bypass graft duplex that showed biphasic waveforms of throughout the left lower extremity 11/10; patient presents for follow-up. She has recently run out of the [place] and has continued to use Hydrofera Blue to the wound bed. She has no issues or complaints today. 11/16; patient presents for follow-up. She states she still had a little bit of Santyl left in the tube that she was able to use to the wound bed this past week. She is continued with Hydrofera Blue. She has been  using the Tubigrip. She has no issues or complaints today. 11/30; patient presents for follow-up. She states she continues to have a little bit of Santyl left and has been using this with Hydrofera Blue. She knows to use Medihoney once this runs out. She has been using Tubigrip. She has no issues or complaints today. 12/5; patient presents for follow-up. She is approved for Apligraf and this was available for placement today. She has no issues or complaints today. She has home health. 12/15; patient  presents for follow-up. Apligraf was placed in standard fashion at last clinic visit under kerlix/coban. She did well with this. The wound is measuring smaller. 12/28; patient presents for follow-up. Apligraf was placed at last clinic visit. She has home health to change the wrap. Apligraf was kept in place for the past 2 weeks. She has tolerated this well. The wound is measuring smaller. Electronic Signature(s) Signed: 04/27/2022 4:25:19 PM By: Kalman Shan DO Entered By: Kalman Shan on 04/27/2022 16:22:33 -------------------------------------------------------------------------------- Physical Exam Details Patient Name: Date of Service: Levy Sjogren J. 04/27/2022 3:15 PM Medical Record Number: [id number] Patient Account Number: [id number] Date of Birth/Sex: Treating RN: 09/23/1955 (66 y.o. F) Primary Care Provider: Harlan Stains Other Clinician: Referring Provider: Treating Provider/Extender: [name] Weeks in Treatment: 12 Constitutional respirations regular, non-labored and within target range for patient.Creig Hines, [name] ([id number]) [id number] Page 4 of 10 Cardiovascular 2+ dorsalis pedis/posterior tibialis pulses. Psychiatric pleasant and cooperative. Notes Left lower extremity: T the posterior aspect there is an open wound with granulation tissue, hypergranulated areas and non viable tissue. No signs  of o surrounding infection. Epithelization occurring to the edges circumferentially. Electronic Signature(s) Signed: 04/27/2022 4:25:19 PM By: Kalman Shan DO Entered By: Kalman Shan on 04/27/2022 16:23:20 -------------------------------------------------------------------------------- Physician Orders Details Patient Name: Date of Service: Stasia Houston RO N J. 04/27/2022 3:15 PM Medical Record Number: [id number] Patient Account Number: [id number] Date of Birth/Sex: Treating RN: 11-08-1955 (66 y.o. [name] Houston, [name] Houston Primary Care Provider: Harlan Stains Other Clinician: Referring Provider: Treating Provider/Extender: Jake Church in Treatment: 12 Verbal / Phone Orders: No Diagnosis Coding Follow-up Appointments ppointment in 2 weeks. - Dr. Heber [place] ON Tuesday Return A Anesthetic (In clinic) Topical Lidocaine 5% applied to wound bed Cellular or Tissue Based Products Wound #2 Left,Posterior Lower Leg Cellular or Tissue Based Product Type: - 04/04/2022 Apligraf applied #1. 04/14/2022 apligraf applied #2 04/27/22 Apligraf applied # 3 Cellular or Tissue Based Product applied to wound bed, secured with steri-strips, cover with Adaptic or Mepitel. (DO NOT REMOVE). Edema Control - Lymphedema / SCD / Other Avoid standing for long periods of time. Exercise regularly Additional Orders / Instructions Follow Nutritious Diet - Monitor/Control Blood Sugars Home Health New wound care orders this week; continue Home Health for wound care. May utilize formulary equivalent dressing for wound treatment orders unless otherwise specified. - Home health to change twice a week OUTER DRESSING ONLY.***** Dressing changes to be completed by [facility] on Monday / Wednesday / Friday except when patient has scheduled visit at [facility]. Other Home Health Orders/Instructions: - [facility] home health Wound Treatment Wound #2 - Lower Leg Wound Laterality: Left,  Posterior Cleanser: Soap and Water (Home Health) 1 x Per Week/30 Days Discharge Instructions: May shower and wash wound with dial antibacterial soap and water prior to dressing change. Cleanser: Wound Cleanser (Home Health) 1 x Per Week/30 Days Discharge Instructions: Cleanse the wound with wound cleanser prior to applying a clean dressing using gauze sponges, not tissue or cotton balls. Peri-Wound Care: Sween Lotion (Moisturizing lotion) (Generic) 1 x Per Week/30 Days Discharge Instructions: Apply moisturizing lotion as directed Prim Dressing: ADAPTIC TOUCH 3x4.25 (in/in) 1 x Per Week/30 Days ary Discharge Instructions: LEAVE ADAPIC AND STERI-STRIPS IN PLACE. Prim Dressing: apligraf ary 1 x Per Week/30 Days JANEANE, COZART ([id number]) [phone] Page 5 of 10 Discharge Instructions: APPLIED BY PROVIDER. LEAVE IN PLACE. Secondary Dressing: ABD Pad, 5x9 (Home Health) 1 x Per Week/30 Days Discharge Instructions: Apply  over primary dressing as directed. Secondary Dressing: Woven Gauze Sponge, Non-Sterile 4x4 in (Home Health) 1 x Per Week/30 Days Discharge Instructions: Apply over primary dressing as directed. Compression Wrap: Kerlix Roll 4.5x3.1 (in/yd) (Home Health) 1 x Per Week/30 Days Discharge Instructions: Apply Kerlix and Coban compression as directed. Compression Wrap: Coban Self-Adherent Wrap 4x5 (in/yd) (Home Health) 1 x Per Week/30 Days Discharge Instructions: Apply over Kerlix as directed. Electronic Signature(s) Signed: 04/27/2022 4:25:19 PM By: Kalman Shan DO Entered By: Kalman Shan on 04/27/2022 16:23:28 -------------------------------------------------------------------------------- Problem List Details Patient Name: Date of Service: Stasia Houston RO N J. 04/27/2022 3:15 PM Medical Record Number: [id number] Patient Account Number: [id number] Date of Birth/Sex: Treating RN: 02/25/1956 (66 y.o. F) Primary Care Provider: Harlan Stains  Other Clinician: Referring Provider: Treating Provider/Extender: Jake Church in Treatment: 12 Active Problems ICD-10 Encounter Code Description Active Date MDM Diagnosis I70.248 Atherosclerosis of native arteries of left leg with ulceration of other part of 01/31/2022 No Yes lower leg L97.828 Non-pressure chronic ulcer of other part of left lower leg with other specified 01/31/2022 No Yes severity E11.622 Type 2 diabetes mellitus with other skin ulcer 01/31/2022 No Yes Inactive Problems Resolved Problems Electronic Signature(s) Signed: 04/27/2022 4:25:19 PM By: Kalman Shan DO Entered By: Kalman Shan on 04/27/2022 16:21:44 Progress Note Details -------------------------------------------------------------------------------- [name] ([id number]) [id number] Page 6 of 10 Patient Name: Date of Service: KATELIND, PYTEL 04/27/2022 3:15 PM Medical Record Number: [id number] Patient Account Number: [id number] Date of Birth/Sex: Treating RN: 1955-12-06 (66 y.o. F) Primary Care Provider: Harlan Stains Other Clinician: Referring Provider: Treating Provider/Extender: Jake Church in Treatment: 12 Subjective Chief Complaint Information obtained from Patient 10/20/2021; Left posterior leg wound 01/31/2022; left posterior leg wound History of Present Illness (HPI) Admission 06/26/2021 Ms. Eupha Lobb is a 66 year old female with a past medical history of uncontrolled type 2 diabetes on oral agents with last hemoglobin A1c of 9, peripheral arterial disease that presents to the clinic for a 3-week history of worsening wound to the posterior aspect of her left leg. She is not sure how it started. She has been using normal saline to the wound bed. She had arterial segmental pressures that showed a left ABI of 0.52 she is scheduled to see vein and vascular on 7/5. She is currently on doxycycline  prescribed by her primary care doctor. She currently denies systemic signs of infection. 7/7; patient presents for follow-up. She saw Dr. Donzetta Matters yesterday, vein and vascular to discuss her arterial status. He is scheduling her for an arteriogram for possible intervention. She has had no issues with the Medihoney. 7/24; patient presents for follow-up. She continues to use Medihoney. She is scheduled to have a stress test on 7/28 in order to be cleared for vascular surgery. Plan is for left common femoral endarterectomy and left common femoral to below the knee popliteal artery bypass. Readmission 01/31/2022 Patient had a left iliofemoral endarterectomy with vein patch angioplasty and common femoral to above-the-knee popliteal bypass with PTFE by Dr. Donzetta Matters on 12/06/2021. She had follow-up with vein and vascular on 01/18/2022 and it was noted that her left foot is well-perfused with brisk DP and PT Doppler signals. She has follow-up again with vein and vascular at the end of this month for bypass duplex and ABIs. She is currently been using Medihoney to the wound bed. She reports improvement to her chronic pain. She currently denies signs of infection. 10/10; patient presents for follow-up. She has been she  has been using Dakin's wet-to-dry dressings however she was having pain on dressing changes. We switched to [facility] in the week. She is done better with this. She has no issues or complaints today. 10/17; patient presents for follow-up. She has been using Santyl and Hydrofera Blue to the wound bed. There is been improvement in wound healing. She has no issues or complaints today. 10/31; patient presents for follow-up. She has been using Santyl and Hydrofera Blue to the wound bed. She has no issues or complaints today. She saw vein and vascular on 10/25 they repeated a TBI on the left which was 0.66. She had a bypass graft duplex that showed biphasic waveforms of throughout the left lower  extremity 11/10; patient presents for follow-up. She has recently run out of the [place] and has continued to use Hydrofera Blue to the wound bed. She has no issues or complaints today. 11/16; patient presents for follow-up. She states she still had a little bit of Santyl left in the tube that she was able to use to the wound bed this past week. She is continued with Hydrofera Blue. She has been using the Tubigrip. She has no issues or complaints today. 11/30; patient presents for follow-up. She states she continues to have a little bit of Santyl left and has been using this with Hydrofera Blue. She knows to use Medihoney once this runs out. She has been using Tubigrip. She has no issues or complaints today. 12/5; patient presents for follow-up. She is approved for Apligraf and this was available for placement today. She has no issues or complaints today. She has home health. 12/15; patient presents for follow-up. Apligraf was placed in standard fashion at last clinic visit under kerlix/coban. She did well with this. The wound is measuring smaller. 12/28; patient presents for follow-up. Apligraf was placed at last clinic visit. She has home health to change the wrap. Apligraf was kept in place for the past 2 weeks. She has tolerated this well. The wound is measuring smaller. Patient History Information obtained from Patient, Caregiver. Family History Cancer - Father, Diabetes - Mother,Maternal Grandparents,Father, Hypertension - Mother, No family history of Heart Disease, Hereditary Spherocytosis, Kidney Disease, Lung Disease, Seizures, Stroke, Thyroid Problems, Tuberculosis. Social History Former smoker, Marital Status - Married, Alcohol Use - Rarely, Drug Use - No History, Caffeine Use - Daily. Medical History Eyes Denies history of Cataracts, Glaucoma, Optic Neuritis Cardiovascular Patient has history of Hypertension, Peripheral Arterial Disease, Peripheral Venous Disease Denies history of  Angina, Arrhythmia, Coronary Artery Disease, Deep Vein Thrombosis, Hypotension, Myocardial Infarction, Phlebitis, Vasculitis Gastrointestinal Denies history of Cirrhosis , Colitis, Crohnoos, Hepatitis A, Hepatitis B, Hepatitis C Endocrine Patient has history of Type II Diabetes - A1C-9.1 Denies history of Type I Diabetes Immunological Denies history of Lupus Erythematosus, Raynaudoos, Scleroderma Musculoskeletal Patient has history of Osteoarthritis, Osteomyelitis - Hx ankle Denies history of Gout, Rheumatoid Arthritis Neurologic DANYELE, SMEJKAL ([id number]) [id number] Page 7 of 10 Denies history of Neuropathy, Quadriplegia, Paraplegia, Seizure Disorder Hospitalization/Surgery History - left knee. Medical A Surgical History Notes nd Genitourinary CKD-3 Objective Constitutional respirations regular, non-labored and within target range for patient.. Vitals Time Taken: 3:16 PM, Height: 64 in, Weight: 197 lbs, BMI: 33.8, Temperature: 97.7 F, Pulse: 60 bpm, Respiratory Rate: 18 breaths/min, Blood Pressure: 136/72 mmHg, Capillary Blood Glucose: 123 mg/dl. Cardiovascular 2+ dorsalis pedis/posterior tibialis pulses. Psychiatric pleasant and cooperative. General Notes: Left lower extremity: T the posterior aspect there is an open wound with granulation tissue, hypergranulated areas  and non viable tissue. No o signs of surrounding infection. Epithelization occurring to the edges circumferentially. Integumentary (Hair, Skin) Wound #2 status is Open. Original cause of wound was Gradually Appeared. The date acquired was: 11/21/2021. The wound has been in treatment 12 weeks. The wound is located on the Left,Posterior Lower Leg. The wound measures 3.4cm length x 2cm width x 0.1cm depth; 5.341cm^2 area and 0.534cm^3 volume. There is Fat Layer (Subcutaneous Tissue) exposed. There is no tunneling or undermining noted. There is a medium amount of serosanguineous  drainage noted. The wound margin is distinct with the outline attached to the wound base. There is medium (34-66%) red, pink, hyper - granulation within the wound bed. There is a medium (34-66%) amount of necrotic tissue within the wound bed including Adherent Slough. The periwound skin appearance did not exhibit: Callus, Crepitus, Excoriation, Induration, Rash, Scarring, Dry/Scaly, Maceration, Atrophie Blanche, Cyanosis, Ecchymosis, Hemosiderin Staining, Mottled, Pallor, Rubor, Erythema. Periwound temperature was noted as No Abnormality. The periwound has tenderness on palpation. Assessment Active Problems ICD-10 Atherosclerosis of native arteries of left leg with ulceration of other part of lower leg Non-pressure chronic ulcer of other part of left lower leg with other specified severity Type 2 diabetes mellitus with other skin ulcer Patient's wound has shown improvement in size appearance since last clinic visit. I debrided nonviable tissue. I used silver nitrate to the hyper granulated areas. Apligraf was placed in standard fashion. Continue Kerlix/Coban. Patient has home health we will change the outer dressing next week. Follow-up in 2 weeks. Procedures Wound #2 Pre-procedure diagnosis of Wound #2 is an Arterial Insufficiency Ulcer located on the Left,Posterior Lower Leg .Severity of Tissue Pre Debridement is: Fat layer exposed. There was a Excisional Skin/Subcutaneous Tissue Debridement with a total area of 6.8 sq cm performed by Kalman Shan, DO. With the following instrument(s): Curette to remove Viable and Non-Viable tissue/material. Material removed includes Subcutaneous Tissue and Slough and after achieving pain control using Lidocaine. No specimens were taken. A time out was conducted at 15:50, prior to the start of the procedure. A Minimum amount of bleeding was controlled with Pressure. The procedure was tolerated well with a pain level of 0 throughout and a pain level of 0  following the procedure. Post Debridement Measurements: 3.4cm length x 2cm width x 0.1cm depth; 0.534cm^3 volume. Character of Wound/Ulcer Post Debridement is improved. Severity of Tissue Post Debridement is: Fat layer exposed. Post procedure Diagnosis Wound #2: Same as Pre-Procedure Pre-procedure diagnosis of Wound #2 is an Arterial Insufficiency Ulcer located on the Left,Posterior Lower Leg. A skin graft procedure using a bioengineered skin substitute/cellular or tissue based product was performed by Kalman Shan, DO. Apligraf was applied and secured with Steri-Strips. 22 sq cm of product was utilized and 22 sq cm was wasted due to wound size. Post Application, adaptic. gauze was applied. A Time Out was conducted at 15:53, prior to the start of the procedure. The procedure was tolerated well with a pain level of 0 throughout and a pain level of 0 following the procedure. Post procedure Diagnosis Wound #2: Same as Pre-Procedure . HANH, KERTESZ ([id number]) [id number] Page 8 of 10 Plan Follow-up Appointments: Return Appointment in 2 weeks. - Dr. Heber [place] ON Tuesday Anesthetic: (In clinic) Topical Lidocaine 5% applied to wound bed Cellular or Tissue Based Products: Wound #2 Left,Posterior Lower Leg: Cellular or Tissue Based Product Type: - 04/04/2022 Apligraf applied #1. 04/14/2022 apligraf applied #2 04/27/22 Apligraf applied # 3 Cellular or Tissue Based Product applied to  wound bed, secured with steri-strips, cover with Adaptic or Mepitel. (DO NOT REMOVE). Edema Control - Lymphedema / SCD / Other: Avoid standing for long periods of time. Exercise regularly Additional Orders / Instructions: Follow Nutritious Diet - Monitor/Control Blood Sugars Home Health: New wound care orders this week; continue Home Health for wound care. May utilize formulary equivalent dressing for wound treatment orders unless otherwise specified. - Home health to change twice a  week OUTER DRESSING ONLY.***** Dressing changes to be completed by [place] on Monday / Wednesday / Friday except when patient has scheduled visit at [facility]. Other Home Health Orders/Instructions: - [facility] home [facility] WOUND #2: - Lower Leg Wound Laterality: Left, Posterior Cleanser: Soap and Water (Home Health) 1 x Per Week/30 Days Discharge Instructions: May shower and wash wound with dial antibacterial soap and water prior to dressing change. Cleanser: Wound Cleanser (Home Health) 1 x Per Week/30 Days Discharge Instructions: Cleanse the wound with wound cleanser prior to applying a clean dressing using gauze sponges, not tissue or cotton balls. Peri-Wound Care: Sween Lotion (Moisturizing lotion) (Generic) 1 x Per Week/30 Days Discharge Instructions: Apply moisturizing lotion as directed Prim Dressing: ADAPTIC TOUCH 3x4.25 (in/in) 1 x Per Week/30 Days ary Discharge Instructions: LEAVE ADAPIC AND STERI-STRIPS IN PLACE. Prim Dressing: apligraf 1 x Per Week/30 Days ary Discharge Instructions: APPLIED BY PROVIDER. LEAVE IN PLACE. Secondary Dressing: ABD Pad, 5x9 (Home Health) 1 x Per Week/30 Days Discharge Instructions: Apply over primary dressing as directed. Secondary Dressing: Woven Gauze Sponge, Non-Sterile 4x4 in (Home Health) 1 x Per Week/30 Days Discharge Instructions: Apply over primary dressing as directed. Com pression Wrap: Kerlix Roll 4.5x3.1 (in/yd) (Home Health) 1 x Per Week/30 Days Discharge Instructions: Apply Kerlix and Coban compression as directed. Com pression Wrap: Coban Self-Adherent Wrap 4x5 (in/yd) (Home Health) 1 x Per Week/30 Days Discharge Instructions: Apply over Kerlix as directed. 1. In office sharp debridement 2. Apligraf placed in standard fashion 3. Kerlix/Coban 4. Follow-up in 1 week Electronic Signature(s) Signed: 04/27/2022 4:25:19 PM By: Kalman Shan DO Entered By: Kalman Shan on 04/27/2022  16:24:34 -------------------------------------------------------------------------------- HxROS Details Patient Name: Date of Service: Stasia Houston RO N J. 04/27/2022 3:15 PM Medical Record Number: [id number] Patient Account Number: [id number] Date of Birth/Sex: Treating RN: 02/09/1956 (66 y.o. F) Primary Care Provider: Harlan Stains Other Clinician: Referring Provider: Treating Provider/Extender: Jake Church in Treatment: 12 Information Obtained From Patient Caregiver Eyes Medical History: Negative for: Cataracts; Glaucoma; Optic Neuritis Cardiovascular NUMA, HEATWOLE ([id number]) [id number] Page 9 of 10 Medical History: Positive for: Hypertension; Peripheral Arterial Disease; Peripheral Venous Disease Negative for: Angina; Arrhythmia; Coronary Artery Disease; Deep Vein Thrombosis; Hypotension; Myocardial Infarction; Phlebitis; Vasculitis Gastrointestinal Medical History: Negative for: Cirrhosis ; Colitis; Crohns; Hepatitis A; Hepatitis B; Hepatitis C Endocrine Medical History: Positive for: Type II Diabetes - A1C-9.1 Negative for: Type I Diabetes Time with diabetes: 12 years Treated with: Oral agents Blood sugar tested every day: No Genitourinary Medical History: Past Medical History Notes: CKD-3 Immunological Medical History: Negative for: Lupus Erythematosus; Raynauds; Scleroderma Musculoskeletal Medical History: Positive for: Osteoarthritis; Osteomyelitis - Hx ankle Negative for: Gout; Rheumatoid Arthritis Neurologic Medical History: Negative for: Neuropathy; Quadriplegia; Paraplegia; Seizure Disorder Immunizations Pneumococcal Vaccine: Received Pneumococcal Vaccination: No Implantable Devices None Hospitalization / Surgery History Type of Hospitalization/Surgery left knee Family and Social History Cancer: Yes - Father; Diabetes: Yes - Mother,Maternal Grandparents,Father; Heart Disease: No;  Hereditary Spherocytosis: No; Hypertension: Yes - Mother; Kidney Disease: No; Lung Disease: No; Seizures: No;  Stroke: No; Thyroid Problems: No; Tuberculosis: No; Former smoker; Marital Status - Married; Alcohol Use: Rarely; Drug Use: No History; Caffeine Use: Daily; Financial Concerns: No; Food, Clothing or Shelter Needs: No; Support System Lacking: No; Transportation Concerns: No Electronic Signature(s) Signed: 04/27/2022 4:25:19 PM By: Kalman Shan DO Entered By: Kalman Shan on 04/27/2022 16:22:38 -------------------------------------------------------------------------------- SuperBill Details Patient Name: Date of Service: [name] Houston. 04/27/2022 Medical Record Number: [id number] Patient Account Number: [id number] Date of Birth/Sex: Treating RN: January 03, 1956 (66 y.o. [name] Primary Care Provider: Harlan Stains Other Clinician: ANAYA, BOVEE ([id number]) [id number] Page 10 of 10 Referring Provider: Treating Provider/Extender: Jake Church in Treatment: 12 Diagnosis Coding ICD-10 Codes Code Description I70.248 Atherosclerosis of native arteries of left leg with ulceration of other part of lower leg L97.828 Non-pressure chronic ulcer of other part of left lower leg with other specified severity E11.622 Type 2 diabetes mellitus with other skin ulcer Facility Procedures : CPT4 Code: 80223361 Description: (Facility Use Only) Apligraf 1 SQ CM Modifier: Quantity: 44 : CPT4 Code: 22449753 Description: 00511 - SKIN SUB GRAFT TRNK/ARM/LEG ICD-10 Diagnosis Description L97.828 Non-pressure chronic ulcer of other part of left lower leg with other specified s E11.622 Type 2 diabetes mellitus with other skin ulcer Modifier: everity Quantity: 1 Physician Procedures : CPT4 Code Description Modifier [id number] - WC PHYS SKIN SUB GRAFT TRNK/ARM/LEG ICD-10 Diagnosis Description L97.828 Non-pressure chronic  ulcer of other part of left lower leg with other specified severity E11.622 Type 2 diabetes mellitus with other  skin ulcer Quantity: 1 Electronic Signature(s) Signed: 04/27/2022 4:25:19 PM By: Kalman Shan DO Entered By: Kalman Shan on 04/27/2022 16:24:48

## 2022-05-04 DIAGNOSIS — E785 Hyperlipidemia, unspecified: Secondary | ICD-10-CM | POA: Diagnosis not present

## 2022-05-04 DIAGNOSIS — Z7982 Long term (current) use of aspirin: Secondary | ICD-10-CM | POA: Diagnosis not present

## 2022-05-04 DIAGNOSIS — I129 Hypertensive chronic kidney disease with stage 1 through stage 4 chronic kidney disease, or unspecified chronic kidney disease: Secondary | ICD-10-CM | POA: Diagnosis not present

## 2022-05-04 DIAGNOSIS — Z48817 Encounter for surgical aftercare following surgery on the skin and subcutaneous tissue: Secondary | ICD-10-CM | POA: Diagnosis not present

## 2022-05-04 DIAGNOSIS — I739 Peripheral vascular disease, unspecified: Secondary | ICD-10-CM | POA: Diagnosis not present

## 2022-05-04 DIAGNOSIS — E1122 Type 2 diabetes mellitus with diabetic chronic kidney disease: Secondary | ICD-10-CM | POA: Diagnosis not present

## 2022-05-04 DIAGNOSIS — K219 Gastro-esophageal reflux disease without esophagitis: Secondary | ICD-10-CM | POA: Diagnosis not present

## 2022-05-04 DIAGNOSIS — N189 Chronic kidney disease, unspecified: Secondary | ICD-10-CM | POA: Diagnosis not present

## 2022-05-04 DIAGNOSIS — Z7984 Long term (current) use of oral hypoglycemic drugs: Secondary | ICD-10-CM | POA: Diagnosis not present

## 2022-05-04 DIAGNOSIS — K449 Diaphragmatic hernia without obstruction or gangrene: Secondary | ICD-10-CM | POA: Diagnosis not present

## 2022-05-09 ENCOUNTER — Encounter (HOSPITAL_BASED_OUTPATIENT_CLINIC_OR_DEPARTMENT_OTHER): Payer: HMO | Attending: Internal Medicine | Admitting: Internal Medicine

## 2022-05-09 DIAGNOSIS — I129 Hypertensive chronic kidney disease with stage 1 through stage 4 chronic kidney disease, or unspecified chronic kidney disease: Secondary | ICD-10-CM | POA: Insufficient documentation

## 2022-05-09 DIAGNOSIS — I70248 Atherosclerosis of native arteries of left leg with ulceration of other part of lower left leg: Secondary | ICD-10-CM | POA: Diagnosis not present

## 2022-05-09 DIAGNOSIS — E1151 Type 2 diabetes mellitus with diabetic peripheral angiopathy without gangrene: Secondary | ICD-10-CM | POA: Diagnosis not present

## 2022-05-09 DIAGNOSIS — L97828 Non-pressure chronic ulcer of other part of left lower leg with other specified severity: Secondary | ICD-10-CM | POA: Diagnosis not present

## 2022-05-09 DIAGNOSIS — E1122 Type 2 diabetes mellitus with diabetic chronic kidney disease: Secondary | ICD-10-CM | POA: Diagnosis not present

## 2022-05-09 DIAGNOSIS — E11622 Type 2 diabetes mellitus with other skin ulcer: Secondary | ICD-10-CM | POA: Diagnosis not present

## 2022-05-09 DIAGNOSIS — Z7984 Long term (current) use of oral hypoglycemic drugs: Secondary | ICD-10-CM | POA: Insufficient documentation

## 2022-05-09 DIAGNOSIS — N183 Chronic kidney disease, stage 3 unspecified: Secondary | ICD-10-CM | POA: Diagnosis not present

## 2022-05-11 DIAGNOSIS — T8189XA Other complications of procedures, not elsewhere classified, initial encounter: Secondary | ICD-10-CM | POA: Diagnosis not present

## 2022-05-11 DIAGNOSIS — I872 Venous insufficiency (chronic) (peripheral): Secondary | ICD-10-CM | POA: Diagnosis not present

## 2022-05-17 DIAGNOSIS — I129 Hypertensive chronic kidney disease with stage 1 through stage 4 chronic kidney disease, or unspecified chronic kidney disease: Secondary | ICD-10-CM | POA: Diagnosis not present

## 2022-05-17 DIAGNOSIS — E1169 Type 2 diabetes mellitus with other specified complication: Secondary | ICD-10-CM | POA: Diagnosis not present

## 2022-05-17 DIAGNOSIS — E785 Hyperlipidemia, unspecified: Secondary | ICD-10-CM | POA: Diagnosis not present

## 2022-05-17 DIAGNOSIS — N1832 Chronic kidney disease, stage 3b: Secondary | ICD-10-CM | POA: Diagnosis not present

## 2022-05-18 ENCOUNTER — Encounter (HOSPITAL_BASED_OUTPATIENT_CLINIC_OR_DEPARTMENT_OTHER): Payer: HMO | Admitting: Internal Medicine

## 2022-05-18 DIAGNOSIS — E11622 Type 2 diabetes mellitus with other skin ulcer: Secondary | ICD-10-CM

## 2022-05-18 DIAGNOSIS — L97828 Non-pressure chronic ulcer of other part of left lower leg with other specified severity: Secondary | ICD-10-CM | POA: Diagnosis not present

## 2022-05-18 DIAGNOSIS — I70248 Atherosclerosis of native arteries of left leg with ulceration of other part of lower left leg: Secondary | ICD-10-CM

## 2022-05-18 NOTE — Progress Notes (Signed)
[name] Houston ([id number]) [id number] Page 1 of 7 Visit Report for 05/18/2022 Chief Complaint Document Details Patient Name: Date of Service: [name] Houston 05/18/2022 9:45 A M Medical Record Number: [id number] Patient Account Number: [id number] Date of Birth/Sex: Treating RN: October 29, 1955 (67 y.o. F) Primary Care Provider: Harlan Stains Other Clinician: Referring Provider: Treating Provider/Extender: Jake Church in Treatment: 15 Information Obtained from: Patient Chief Complaint 10/20/2021; Left posterior leg wound 01/31/2022; left posterior leg wound Electronic Signature(s) Signed: 05/18/2022 12:16:58 PM By: Kalman Shan DO Entered By: Kalman Shan on 05/18/2022 11:30:53 -------------------------------------------------------------------------------- HPI Details Patient Name: Date of Service: [name] Cavalier RO N J. 05/18/2022 9:45 A M Medical Record Number: [id number] Patient Account Number: [id number] Date of Birth/Sex: Treating RN: 1956-03-29 (67 y.o. F) Primary Care Provider: Harlan Stains Other Clinician: Referring Provider: Treating Provider/Extender: Jake Church in Treatment: 15 History of Present Illness HPI Description: Admission 06/26/2021 [name] Houston is a 67 year old female with a past medical history of uncontrolled type 2 diabetes on oral agents with last hemoglobin A1c of 9, peripheral arterial disease that presents to the clinic for a 3-week history of worsening wound to the posterior aspect of her left leg. She is not sure how it started. She has been using normal saline to the wound bed. She had arterial segmental pressures that showed a left ABI of 0.52 she is scheduled to see vein and vascular on 7/5. She is currently on doxycycline prescribed by her primary care doctor. She currently denies systemic signs of infection. 7/7; patient presents for follow-up. She saw  Dr. Donzetta Matters yesterday, vein and vascular to discuss her arterial status. He is scheduling her for an arteriogram for possible intervention. She has had no issues with the Medihoney. 7/24; patient presents for follow-up. She continues to use Medihoney. She is scheduled to have a stress test on 7/28 in order to be cleared for vascular surgery. Plan is for left common femoral endarterectomy and left common femoral to below the knee popliteal artery bypass. Readmission 01/31/2022 Patient had a left iliofemoral endarterectomy with vein patch angioplasty and common femoral to above-the-knee popliteal bypass with PTFE by Dr. Donzetta Matters on 12/06/2021. She had follow-up with vein and vascular on 01/18/2022 and it was noted that her left foot is well-perfused with brisk DP and PT Doppler signals. She has follow-up again with vein and vascular at the end of this month for bypass duplex and ABIs. She is currently been using Medihoney to the wound bed. She reports improvement to her chronic pain. She currently denies signs of infection. 10/10; patient presents for follow-up. She has been she has been using Dakin's wet-to-dry dressings however she was having pain on dressing changes. We switched to [facility] in the week. She is done better with this. She has no issues or complaints today. 10/17; patient presents for follow-up. She has been using Santyl and Hydrofera Blue to the wound bed. There is been improvement in wound healing. She has no issues or complaints today. 10/31; patient presents for follow-up. She has been using Santyl and Hydrofera Blue to the wound bed. She has no issues or complaints today. She saw vein and vascular on 10/25 they repeated a TBI on the left which was 0.66. She had a bypass graft duplex that showed biphasic waveforms of throughout the left lower extremity 11/10; patient presents for follow-up. She has recently run out of the [place] and has continued to use Hydrofera Blue to the wound bed.  She has no issues or complaints today. [name] Houston ([id number]) [id number] Page 2 of 7 11/16; patient presents for follow-up. She states she still had a little bit of Santyl left in the tube that she was able to use to the wound bed this past week. She is continued with Hydrofera Blue. She has been using the Tubigrip. She has no issues or complaints today. 11/30; patient presents for follow-up. She states she continues to have a little bit of Santyl left and has been using this with Hydrofera Blue. She knows to use Medihoney once this runs out. She has been using Tubigrip. She has no issues or complaints today. 12/5; patient presents for follow-up. She is approved for Apligraf and this was available for placement today. She has no issues or complaints today. She has home health. 12/15; patient presents for follow-up. Apligraf was placed in standard fashion at last clinic visit under kerlix/coban. She did well with this. The wound is measuring smaller. 12/28; patient presents for follow-up. Apligraf was placed at last clinic visit. She has home health to change the wrap. Apligraf was kept in place for the past 2 weeks. She has tolerated this well. The wound is measuring smaller. 1/9; patient presents for follow-up. Apligraf was placed at last clinic visit. Home health is changing the wrap once weekly. She has no issues or complaints today. Due to the new year insurance was run again for apligraf. Due to high out-of-pocket cost patient declined proceeding with this. 1/18; patient presents for follow-up. We have been using collagen under Kerlix/Coban. She has home health and they have changed the dressing. The collagen is stuck to her skin. She has no issues or complaints today. Electronic Signature(s) Signed: 05/18/2022 12:16:58 PM By: Kalman Shan DO Entered By: Kalman Shan on 05/18/2022  11:31:52 -------------------------------------------------------------------------------- Physical Exam Details Patient Name: Date of Service: [name] Cavalier RO N J. 05/18/2022 9:45 A M Medical Record Number: [id number] Patient Account Number: [id number] Date of Birth/Sex: Treating RN: 02/26/1956 (67 y.o. F) Primary Care Provider: Harlan Stains Other Clinician: Referring Provider: Treating Provider/Extender: [name] Weeks in Treatment: 15 Constitutional respirations regular, non-labored and within target range for patient.. Cardiovascular 2+ dorsalis pedis/posterior tibialis pulses. Psychiatric pleasant and cooperative. Notes Left lower extremity: T the posterior aspect there is a scab with collagen stuck. After removing this she has a tiny scab still present. The surrounding skin is o epithelialized. No drainage noted. The scab appears thin and hardened with no fluctuance. Electronic Signature(s) Signed: 05/18/2022 12:16:58 PM By: Kalman Shan DO Entered By: Kalman Shan on 05/18/2022 11:33:05 -------------------------------------------------------------------------------- Physician Orders Details Patient Name: Date of Service: [name] Cavalier RO N J. 05/18/2022 9:45 A M Medical Record Number: [id number] Patient Account Number: [id number] Date of Birth/Sex: Treating RN: 1956-04-16 (67 y.o. [name] Houston, [name] Houston Primary Care Provider: Harlan Stains Other Clinician: Referring Provider: Treating Provider/Extender: Jake Church in Treatment: [address] Verbal / Phone Orders: No AMANDA, STEUART ([id number]) [id number] Page 3 of 7 Diagnosis Coding Follow-up Appointments Other: - You can put bacitracin ointment and a bandaid over your scab. Discharge From [facility] Services Discharge from [place] Edema Control - Lymphedema / SCD / Other Patient to wear own compression stockings every day. [place] home health for wound care. Electronic Signature(s) Signed: 05/18/2022 12:16:58 PM By: Kalman Shan DO Entered By: Kalman Shan on 05/18/2022 11:33:12 -------------------------------------------------------------------------------- Problem List Details Patient Name: Date of Service: [name] Cavalier RO N J. 05/18/2022 9:45 A M  Medical Record Number: [id number] Patient Account Number: [id number] Date of Birth/Sex: Treating RN: Oct 03, 1955 (67 y.o. F) Primary Care Provider: Harlan Stains Other Clinician: Referring Provider: Treating Provider/Extender: Jake Church in Treatment: 15 Active Problems ICD-10 Encounter Code Description Active Date MDM Diagnosis I70.248 Atherosclerosis of native arteries of left leg with ulceration of other part of 01/31/2022 No Yes lower leg L97.828 Non-pressure chronic ulcer of other part of left lower leg with other specified 01/31/2022 No Yes severity E11.622 Type 2 diabetes mellitus with other skin ulcer 01/31/2022 No Yes Inactive Problems Resolved Problems Electronic Signature(s) Signed: 05/18/2022 12:16:58 PM By: Kalman Shan DO Entered By: Kalman Shan on 05/18/2022 11:30:41 Progress Note Details -------------------------------------------------------------------------------- [name] Houston ([id number]) [id number] Page 4 of 7 Patient Name: Date of Service: ZYANNA, LEISINGER 05/18/2022 9:45 A M Medical Record Number: [id number] Patient Account Number: [id number] Date of Birth/Sex: Treating RN: [birth year] (67 y.o. F) Primary Care Provider: Harlan Stains Other Clinician: Referring Provider: Treating Provider/Extender: Jake Church in Treatment: 15 Subjective Chief Complaint Information obtained from Patient 10/20/2021; Left posterior leg wound 01/31/2022; left posterior leg wound History of Present Illness (HPI) Admission  06/26/2021 Ms. Maurissa Ambrose is a 67 year old female with a past medical history of uncontrolled type 2 diabetes on oral agents with last hemoglobin A1c of 9, peripheral arterial disease that presents to the clinic for a 3-week history of worsening wound to the posterior aspect of her left leg. She is not sure how it started. She has been using normal saline to the wound bed. She had arterial segmental pressures that showed a left ABI of 0.52 she is scheduled to see vein and vascular on 7/5. She is currently on doxycycline prescribed by her primary care doctor. She currently denies systemic signs of infection. 7/7; patient presents for follow-up. She saw Dr. Donzetta Matters yesterday, vein and vascular to discuss her arterial status. He is scheduling her for an arteriogram for possible intervention. She has had no issues with the Medihoney. 7/24; patient presents for follow-up. She continues to use Medihoney. She is scheduled to have a stress test on 7/28 in order to be cleared for vascular surgery. Plan is for left common femoral endarterectomy and left common femoral to below the knee popliteal artery bypass. Readmission 01/31/2022 Patient had a left iliofemoral endarterectomy with vein patch angioplasty and common femoral to above-the-knee popliteal bypass with PTFE by Dr. Donzetta Matters on 12/06/2021. She had follow-up with vein and vascular on 01/18/2022 and it was noted that her left foot is well-perfused with brisk DP and PT Doppler signals. She has follow-up again with vein and vascular at the end of this month for bypass duplex and ABIs. She is currently been using Medihoney to the wound bed. She reports improvement to her chronic pain. She currently denies signs of infection. 10/10; patient presents for follow-up. She has been she has been using Dakin's wet-to-dry dressings however she was having pain on dressing changes. We switched to [facility] in the week. She is done better with this. She has no issues or  complaints today. 10/17; patient presents for follow-up. She has been using Santyl and Hydrofera Blue to the wound bed. There is been improvement in wound healing. She has no issues or complaints today. 10/31; patient presents for follow-up. She has been using Santyl and Hydrofera Blue to the wound bed. She has no issues or complaints today. She saw vein and vascular on 10/25 they repeated a TBI on the left  which was 0.66. She had a bypass graft duplex that showed biphasic waveforms of throughout the left lower extremity 11/10; patient presents for follow-up. She has recently run out of the [place] and has continued to use Hydrofera Blue to the wound bed. She has no issues or complaints today. 11/16; patient presents for follow-up. She states she still had a little bit of Santyl left in the tube that she was able to use to the wound bed this past week. She is continued with Hydrofera Blue. She has been using the Tubigrip. She has no issues or complaints today. 11/30; patient presents for follow-up. She states she continues to have a little bit of Santyl left and has been using this with Hydrofera Blue. She knows to use Medihoney once this runs out. She has been using Tubigrip. She has no issues or complaints today. 12/5; patient presents for follow-up. She is approved for Apligraf and this was available for placement today. She has no issues or complaints today. She has home health. 12/15; patient presents for follow-up. Apligraf was placed in standard fashion at last clinic visit under kerlix/coban. She did well with this. The wound is measuring smaller. 12/28; patient presents for follow-up. Apligraf was placed at last clinic visit. She has home health to change the wrap. Apligraf was kept in place for the past 2 weeks. She has tolerated this well. The wound is measuring smaller. 1/9; patient presents for follow-up. Apligraf was placed at last clinic visit. Home health is changing the wrap once  weekly. She has no issues or complaints today. Due to the new year insurance was run again for apligraf. Due to high out-of-pocket cost patient declined proceeding with this. 1/18; patient presents for follow-up. We have been using collagen under Kerlix/Coban. She has home health and they have changed the dressing. The collagen is stuck to her skin. She has no issues or complaints today. Patient History Information obtained from Patient, Caregiver. Family History Cancer - Father, Diabetes - Mother,Maternal Grandparents,Father, Hypertension - Mother, No family history of Heart Disease, Hereditary Spherocytosis, Kidney Disease, Lung Disease, Seizures, Stroke, Thyroid Problems, Tuberculosis. Social History Former smoker, Marital Status - Married, Alcohol Use - Rarely, Drug Use - No History, Caffeine Use - Daily. Medical History Eyes Denies history of Cataracts, Glaucoma, Optic Neuritis Cardiovascular Patient has history of Hypertension, Peripheral Arterial Disease, Peripheral Venous Disease Denies history of Angina, Arrhythmia, Coronary Artery Disease, Deep Vein Thrombosis, Hypotension, Myocardial Infarction, Phlebitis, Vasculitis Gastrointestinal Denies history of Cirrhosis , Colitis, Crohnoos, Hepatitis A, Hepatitis B, Hepatitis C Endocrine Patient has history of Type II Diabetes - A1C-9.1 Denies history of Type I Diabetes Immunological [name] Houston, [name] Houston ([id number]) [id number] Page 5 of 7 Denies history of Lupus Erythematosus, Raynaudoos, Scleroderma Musculoskeletal Patient has history of Osteoarthritis, Osteomyelitis - Hx ankle Denies history of Gout, Rheumatoid Arthritis Neurologic Denies history of Neuropathy, Quadriplegia, Paraplegia, Seizure Disorder Hospitalization/Surgery History - left knee. Medical A Surgical History Notes nd Genitourinary CKD-3 Objective Constitutional respirations regular, non-labored and within target range for  patient.. Vitals Time Taken: 10:06 AM, Height: 64 in, Weight: 197 lbs, BMI: 33.8, Temperature: 97.7 F, Pulse: 64 bpm, Respiratory Rate: 17 breaths/min, Blood Pressure: 138/69 mmHg, Capillary Blood Glucose: 85 mg/dl. Cardiovascular 2+ dorsalis pedis/posterior tibialis pulses. Psychiatric pleasant and cooperative. General Notes: Left lower extremity: T the posterior aspect there is a scab with collagen stuck. After removing this she has a tiny scab still present. The o surrounding skin is epithelialized. No drainage noted. The scab appears thin  and hardened with no fluctuance. Integumentary (Hair, Skin) Wound #2 status is Healed - Epithelialized. Original cause of wound was Gradually Appeared. The date acquired was: 11/21/2021. The wound has been in treatment 15 weeks. The wound is located on the Left,Posterior Lower Leg. The wound measures 0cm length x 0cm width x 0cm depth; 0cm^2 area and 0cm^3 volume. There is Fat Layer (Subcutaneous Tissue) exposed. There is no tunneling or undermining noted. There is a medium amount of serosanguineous drainage noted. The wound margin is distinct with the outline attached to the wound base. There is medium (34-66%) red, pink, hyper - granulation within the wound bed. There is a medium (34-66%) amount of necrotic tissue within the wound bed. The periwound skin appearance did not exhibit: Callus, Crepitus, Excoriation, Induration, Rash, Scarring, Dry/Scaly, Maceration, Atrophie Blanche, Cyanosis, Ecchymosis, Hemosiderin Staining, Mottled, Pallor, Rubor, Erythema. Periwound temperature was noted as No Abnormality. The periwound has tenderness on palpation. Assessment Active Problems ICD-10 Atherosclerosis of native arteries of left leg with ulceration of other part of lower leg Non-pressure chronic ulcer of other part of left lower leg with other specified severity Type 2 diabetes mellitus with other skin ulcer Patient has done well with collagen under  Kerlix/Coban. Her wound has a very small scab present. The surrounding skin is epithelialized. Her wound appears healed. I recommended keeping the area protected with a Band-Aid daily for 1 week. T help with her Lower extremity swelling I recommended using Tubigrip o daily. She does not have compression stockings. I do not believe she will need compression stockings long-term. She may follow-up as needed. Plan Follow-up Appointments: Other: - You can put bacitracin ointment and a bandaid over your scab. Discharge From [facility] Services: Discharge from [place] Edema Control - Lymphedema / SCD / Other: Patient to wear own compression stockings every day. Home Health: [place] home [facility] for wound care. 1. Keep area protected daily for 1 more week 2. Follow-up as needed 3. Discharge from clinic due to closed wound [name] Houston, [name] Houston ([id number]) [id number] Page 6 of 7 4. Can use Tubigrip daily Electronic Signature(s) Signed: 05/18/2022 12:16:58 PM By: Kalman Shan DO Entered By: Kalman Shan on 05/18/2022 11:35:56 -------------------------------------------------------------------------------- HxROS Details Patient Name: Date of Service: [name] Cavalier RO N J. 05/18/2022 9:45 A M Medical Record Number: [id number] Patient Account Number: [id number] Date of Birth/Sex: Treating RN: 02-14-1956 (67 y.o. F) Primary Care Provider: Harlan Stains Other Clinician: Referring Provider: Treating Provider/Extender: Jake Church in Treatment: 15 Information Obtained From Patient Caregiver Eyes Medical History: Negative for: Cataracts; Glaucoma; Optic Neuritis Cardiovascular Medical History: Positive for: Hypertension; Peripheral Arterial Disease; Peripheral Venous Disease Negative for: Angina; Arrhythmia; Coronary Artery Disease; Deep Vein Thrombosis; Hypotension; Myocardial Infarction; Phlebitis;  Vasculitis Gastrointestinal Medical History: Negative for: Cirrhosis ; Colitis; Crohns; Hepatitis A; Hepatitis B; Hepatitis C Endocrine Medical History: Positive for: Type II Diabetes - A1C-9.1 Negative for: Type I Diabetes Time with diabetes: 12 years Treated with: Oral agents Blood sugar tested every day: No Genitourinary Medical History: Past Medical History Notes: CKD-3 Immunological Medical History: Negative for: Lupus Erythematosus; Raynauds; Scleroderma Musculoskeletal Medical History: Positive for: Osteoarthritis; Osteomyelitis - Hx ankle Negative for: Gout; Rheumatoid Arthritis Neurologic Medical History: Negative for: Neuropathy; Quadriplegia; Paraplegia; Seizure Disorder Immunizations Pneumococcal Vaccine: [name] Houston, [name] Houston ([id number]) [id number] Page 7 of 7 Received Pneumococcal Vaccination: No Implantable Devices None Hospitalization / Surgery History Type of Hospitalization/Surgery left knee Family and Social History Cancer: Yes - Father; Diabetes: Yes - Mother,Maternal Grandparents,Father; Heart Disease:  No; Hereditary Spherocytosis: No; Hypertension: Yes - Mother; Kidney Disease: No; Lung Disease: No; Seizures: No; Stroke: No; Thyroid Problems: No; Tuberculosis: No; Former smoker; Marital Status - Married; Alcohol Use: Rarely; Drug Use: No History; Caffeine Use: Daily; Financial Concerns: No; Food, Clothing or Shelter Needs: No; Support System Lacking: No; Transportation Concerns: No Electronic Signature(s) Signed: 05/18/2022 12:16:58 PM By: Kalman Shan DO Entered By: Kalman Shan on 05/18/2022 11:31:58 -------------------------------------------------------------------------------- SuperBill Details Patient Name: Date of Service: [name] Cavalier RO N J. 05/18/2022 Medical Record Number: [id number] Patient Account Number: [id number] Date of Birth/Sex: Treating RN: [birth year] (67 y.o. [name] Houston, [name] Houston Primary Care  Provider: Harlan Stains Other Clinician: Referring Provider: Treating Provider/Extender: [name] Weeks in Treatment: 15 Diagnosis Coding ICD-10 Codes Code Description [phone] Atherosclerosis of native arteries of left leg with ulceration of other part of lower leg L97.828 Non-pressure chronic ulcer of other part of left lower leg with other specified severity E11.622 Type 2 diabetes mellitus with other skin ulcer Facility Procedures : CPT4 Code: [id number] Description: 57903 - WOUND CARE VISIT-LEV 3 EST PT Modifier: Quantity: 1 Physician Procedures : CPT4 Code Description Modifier [id number] - WC PHYS LEVEL 3 - EST PT ICD-10 Diagnosis Description I70.248 Atherosclerosis of native arteries of left leg with ulceration of other part of lower leg L97.828 Non-pressure chronic ulcer of other part of  left lower leg with other specified severity E11.622 Type 2 diabetes mellitus with other skin ulcer Quantity: 1 Electronic Signature(s) Signed: 05/18/2022 12:16:58 PM By: Kalman Shan DO Entered By: Kalman Shan on 05/18/2022 11:36:03

## 2022-05-24 ENCOUNTER — Ambulatory Visit: Payer: HMO

## 2022-05-24 ENCOUNTER — Other Ambulatory Visit (HOSPITAL_COMMUNITY): Payer: HMO

## 2022-05-24 ENCOUNTER — Encounter (HOSPITAL_COMMUNITY): Payer: HMO

## 2022-05-24 NOTE — Progress Notes (Signed)
[name] Houston ([id number]) [id number] Page 1 of 8 Visit Report for 05/18/2022 Arrival Information Details Patient Name: Date of Service: [name] Houston, [name] Houston 05/18/2022 9:45 A M Medical Record Number: [id number] Patient Account Number: [id number] Date of Birth/Sex: Treating RN: 1956/02/04 (67 y.o. [name] Houston, [name] Primary Care Ethel Meisenheimer: Harlan Stains Other Clinician: Referring Trever Streater: Treating Jadi Deyarmin/Extender: Jake Church in Treatment: 15 Visit Information History Since Last Visit Added or deleted any medications: No Patient Arrived: Ambulatory Any new allergies or adverse reactions: No Arrival Time: 10:06 Had a fall or experienced change in No Accompanied By: husband activities of daily living that may affect Transfer Assistance: None risk of falls: Patient Identification Verified: Yes Signs or symptoms of abuse/neglect since last visito No Secondary Verification Process Completed: Yes Hospitalized since last visit: No Patient Requires Transmission-Based Precautions: No Implantable device outside of the clinic excluding No Patient Has Alerts: No cellular tissue based products placed in the center since last visit: Has Dressing in Place as Prescribed: Yes Has Compression in Place as Prescribed: Yes Pain Present Now: No Electronic Signature(s) Signed: 05/24/2022 4:09:44 PM By: Rhae Hammock RN Entered By: Rhae Hammock on 05/18/2022 10:06:40 -------------------------------------------------------------------------------- Clinic Level of Care Assessment Details Patient Name: Date of Service: KESLEE, HARRINGTON. 05/18/2022 9:45 A M Medical Record Number: [id number] Patient Account Number: [id number] Date of Birth/Sex: Treating RN: 1956-03-16 (67 y.o. [name] Houston, [name] Primary Care Linder Prajapati: Harlan Stains Other Clinician: Referring Biannca Scantlin: Treating Wyndham Santilli/Extender: Jake Church in Treatment: 15 Clinic Level of Care Assessment Items TOOL 4 Quantity Score X- 1 0 Use when only an EandM is performed on FOLLOW-UP visit ASSESSMENTS - Nursing Assessment / Reassessment X- 1 10 Reassessment of Co-morbidities (includes updates in patient status) X- 1 5 Reassessment of Adherence to Treatment Plan ASSESSMENTS - Wound and Skin A ssessment / Reassessment X - Simple Wound Assessment / Reassessment - one wound 1 5 '[]'$  - 0 Complex Wound Assessment / Reassessment - multiple wounds '[]'$  - 0 Dermatologic / Skin Assessment (not related to wound area) ASSESSMENTS - Focused Assessment X- 1 5 Circumferential Edema Measurements - multi extremities '[]'$  - 0 Nutritional Assessment / Counseling / Intervention TERIANN, LIVINGOOD ([id number]) [id number] Page 2 of 8 '[]'$  - 0 Lower Extremity Assessment (monofilament, tuning fork, pulses) '[]'$  - 0 Peripheral Arterial Disease Assessment (using hand held doppler) ASSESSMENTS - Ostomy and/or Continence Assessment and Care '[]'$  - 0 Incontinence Assessment and Management '[]'$  - 0 Ostomy Care Assessment and Management (repouching, etc.) PROCESS - Coordination of Care X - Simple Patient / Family Education for ongoing care 1 15 '[]'$  - 0 Complex (extensive) Patient / Family Education for ongoing care X- 1 10 Staff obtains Programmer, systems, Records, T Results / Process Orders est '[]'$  - 0 Staff telephones HHA, Nursing Homes / Clarify orders / etc '[]'$  - 0 Routine Transfer to another Facility (non-emergent condition) '[]'$  - 0 Routine Hospital Admission (non-emergent condition) '[]'$  - 0 New Admissions / Biomedical engineer / Ordering NPWT Apligraf, etc. , '[]'$  - 0 Emergency Hospital Admission (emergent condition) X- 1 10 Simple Discharge Coordination '[]'$  - 0 Complex (extensive) Discharge Coordination PROCESS - Special Needs '[]'$  - 0 Pediatric / Minor Patient Management '[]'$  - 0 Isolation Patient Management '[]'$  -  0 Hearing / Language / Visual special needs '[]'$  - 0 Assessment of Community assistance (transportation, D/C planning, etc.) '[]'$  - 0 Additional assistance / Altered mentation '[]'$  - 0 Support Surface(s) Assessment (bed, cushion, seat, etc.) INTERVENTIONS -  Wound Cleansing / Measurement X - Simple Wound Cleansing - one wound 1 5 '[]'$  - 0 Complex Wound Cleansing - multiple wounds X- 1 5 Wound Imaging (photographs - any number of wounds) '[]'$  - 0 Wound Tracing (instead of photographs) X- 1 5 Simple Wound Measurement - one wound '[]'$  - 0 Complex Wound Measurement - multiple wounds INTERVENTIONS - Wound Dressings X - Small Wound Dressing one or multiple wounds 1 10 '[]'$  - 0 Medium Wound Dressing one or multiple wounds '[]'$  - 0 Large Wound Dressing one or multiple wounds X- 1 5 Application of Medications - topical '[]'$  - 0 Application of Medications - injection INTERVENTIONS - Miscellaneous '[]'$  - 0 External ear exam '[]'$  - 0 Specimen Collection (cultures, biopsies, blood, body fluids, etc.) '[]'$  - 0 Specimen(s) / Culture(s) sent or taken to Lab for analysis '[]'$  - 0 Patient Transfer (multiple staff / Civil Service fast streamer / Similar devices) '[]'$  - 0 Simple Staple / Suture removal (25 or less) '[]'$  - 0 Complex Staple / Suture removal (26 or more) '[]'$  - 0 Hypo / Hyperglycemic Management (close monitor of Blood Glucose) SERGIO, HOBART ([id number]) [phone].pdf Page 3 of 8 '[]'$  - 0 Ankle / Brachial Index (ABI) - do not check if billed separately X- 1 5 Vital Signs Has the patient been seen at the hospital within the last three years: Yes Total Score: 95 Level Of Care: New/Established - Level 3 Electronic Signature(s) Signed: 05/24/2022 4:09:44 PM By: Rhae Hammock RN Entered By: Rhae Hammock on 05/18/2022 11:03:42 -------------------------------------------------------------------------------- Encounter Discharge Information Details Patient Name: Date of Service: [name] Houston RO N J. 05/18/2022 9:45 A M Medical Record Number: [id number] Patient Account Number: [id number] Date of Birth/Sex: Treating RN: 08/23/1955 (67 y.o. [name] Houston, [name] Primary Care Bacilio Abascal: Harlan Stains Other Clinician: Referring Jaycion Treml: Treating Clotilda Hafer/Extender: Jake Church in Treatment: 15 Encounter Discharge Information Items Discharge Condition: Stable Ambulatory Status: Ambulatory Discharge Destination: Home Transportation: Private Auto Accompanied By: husband Schedule Follow-up Appointment: Yes Clinical Summary of Care: Patient Declined Electronic Signature(s) Signed: 05/24/2022 4:09:44 PM By: Rhae Hammock RN Entered By: Rhae Hammock on 05/18/2022 11:04:53 -------------------------------------------------------------------------------- Lower Extremity Assessment Details Patient Name: Date of Service: [name] Houston RO N J. 05/18/2022 9:45 A M Medical Record Number: [id number] Patient Account Number: [id number] Date of Birth/Sex: Treating RN: [birth year] (67 y.o. [name] Houston, [name] Primary Care Monzerrat Wellen: Harlan Stains Other Clinician: Referring Teren Zurcher: Treating Kysa Calais/Extender: [name] Weeks in Treatment: 15 Edema Assessment Assessed: [Left: Yes] [Right: No] Edema: [Left: N] [Right: o] Calf Left: Right: Point of Measurement: 29 cm From Medial Instep 37.5 cm Ankle Left: Right: Point of Measurement: 7 cm From Medial Instep 22 cm Vascular Assessment Left: [id number] Page 4 of 8Right:] Pulses: Dorsalis Pedis Palpable: [phone].pdf Page 4 of 8Yes] Posterior Tibial Palpable: [phone].pdf Page 4 of 8Yes] Electronic Signature(s) Signed: 05/24/2022 4:09:44 PM By: Rhae Hammock RN Entered By: Rhae Hammock on 05/18/2022 10:07:26 -------------------------------------------------------------------------------- Multi  Wound Chart Details Patient Name: Date of Service: [name] Houston RO N J. 05/18/2022 9:45 A M Medical Record Number: [id number] Patient Account Number: [id number] Date of Birth/Sex: Treating RN: [birth year] (67 y.o. F) Primary Care Sarp Vernier: Harlan Stains Other Clinician: Referring Allister Lessley: Treating Jadi Deyarmin/Extender: Jake Church in Treatment: 15 Vital Signs Height(in): 64 Capillary Blood Glucose(mg/dl): 85 Weight(lbs): 197 Pulse(bpm): 64 Body Mass Index(BMI): 33.8 Blood Pressure(mmHg): 138/69 Temperature(F): 97.7 Respiratory Rate(breaths/min): 17 [2:Photos:] [N/A:N/A] Left, Posterior Lower Leg N/A N/A Wound Location: Gradually Appeared N/A N/A Wounding Event: Arterial Insufficiency  Ulcer N/A N/A Primary Etiology: Hypertension, Peripheral Arterial N/A N/A Comorbid History: Disease, Peripheral Venous Disease, Type II Diabetes, Osteoarthritis, Osteomyelitis 11/21/2021 N/A N/A Date Acquired: 15 N/A N/A Weeks of Treatment: Healed - Epithelialized N/A N/A Wound Status: No N/A N/A Wound Recurrence: 0x0x0 N/A N/A Measurements L x W x D (cm) 0 N/A N/A A (cm) : rea 0 N/A N/A Volume (cm) : 100.00% N/A N/A % Reduction in Area: 100.00% N/A N/A % Reduction in Volume: Full Thickness Without Exposed N/A N/A Classification: Support Structures Medium N/A N/A Exudate Amount: Serosanguineous N/A N/A Exudate Type: red, brown N/A N/A Exudate Color: Distinct, outline attached N/A N/A Wound Margin: Medium (34-66%) N/A N/A Granulation Amount: Red, Pink, Hyper-granulation N/A N/A Granulation Quality: Medium (34-66%) N/A N/A Necrotic Amount: Fat Layer (Subcutaneous Tissue): Yes N/A N/A Exposed Structures: Fascia: No Tendon: No Muscle: No Joint: No Bone: No Medium (34-66%) N/A N/A EpithelializationCORDIE, BEAZLEY ([id number]) [phone].pdf Page 5 of 8 Excoriation: No N/A N/A Periwound Skin Texture: Induration:  No Callus: No Crepitus: No Rash: No Scarring: No Maceration: No N/A N/A Periwound Skin Moisture: Dry/Scaly: No Atrophie Blanche: No N/A N/A Periwound Skin Color: Cyanosis: No Ecchymosis: No Erythema: No Hemosiderin Staining: No Mottled: No Pallor: No Rubor: No No Abnormality N/A N/A Temperature: Yes N/A N/A Tenderness on Palpation: Treatment Notes Wound #2 (Lower Leg) Wound Laterality: Left, Posterior Cleanser Peri-Wound Care Topical Primary Dressing Secondary Dressing Secured With Compression Wrap Compression Stockings Add-Ons Electronic Signature(s) Signed: 05/18/2022 12:16:58 PM By: Kalman Shan DO Entered By: Kalman Shan on 05/18/2022 11:30:46 -------------------------------------------------------------------------------- Multi-Disciplinary Care Plan Details Patient Name: Date of Service: [name] Houston RO N J. 05/18/2022 9:45 A M Medical Record Number: [id number] Patient Account Number: [id number] Date of Birth/Sex: Treating RN: [birth year] (67 y.o. [name] Houston, [name] Primary Care Sacha Topor: Harlan Stains Other Clinician: Referring Ghalia Reicks: Treating Patrici Minnis/Extender: [name] Weeks in Treatment: 15 Active Inactive Electronic Signature(s) Signed: 05/24/2022 4:09:44 PM By: Rhae Hammock RN Entered By: Rhae Hammock on 05/18/2022 11:02:31 [name] ([id number]) [id number] Page 6 of 8 -------------------------------------------------------------------------------- Pain Assessment Details Patient Name: Date of Service: ELEANORA, GUINYARD 05/18/2022 9:45 A M Medical Record Number: [id number] Patient Account Number: [id number] Date of Birth/Sex: Treating RN: 06/09/1955 (67 y.o. [name] Houston, [name] Primary Care Shawnee Higham: Harlan Stains Other Clinician: Referring Juline Sanderford: Treating [place] Duquette/Extender: [name] Weeks in Treatment: 15 Active Problems Location of  Pain Severity and Description of Pain Patient Has Paino No Site Locations Pain Management and Medication Current Pain Management: Electronic Signature(s) Signed: 05/24/2022 4:09:44 PM By: Rhae Hammock RN Entered By: Rhae Hammock on 05/18/2022 10:07:19 -------------------------------------------------------------------------------- Patient/Caregiver Education Details Patient Name: Date of Service: [name] 1/18/2024andnbsp9:45 A M Medical Record Number: [id number] Patient Account Number: [id number] Date of Birth/Gender: Treating RN: 1956-04-18 (67 y.o. [name] Primary Care Physician: Harlan Stains Other Clinician: Referring Physician: Treating Physician/Extender: Jake Church in Treatment: 15 Education Assessment Education Provided To: Patient Education Topics Provided Wound/Skin Impairment: Methods: Explain/Verbal Responses: State content correctly [employer]) Signed: 05/24/2022 4:09:44 PM By: Rhae Hammock RN Entered By: Rhae Hammock on 05/18/2022 11:02:54 [name] ([id number]) [phone].pdf Page 7 of 8 -------------------------------------------------------------------------------- Wound Assessment Details Patient Name: Date of Service: BRUCHY, MIKEL 05/18/2022 9:45 A M Medical Record Number: [id number] Patient Account Number: [id number] Date of Birth/Sex: Treating RN: 1955/05/20 (67 y.o. [name] Houston, [name] Primary Care [place]: Harlan Stains Other Clinician: Referring Dequane Strahan: Treating Kendria Halberg/Extender: [name]  Weeks in Treatment: 15 Wound Status Wound Number: 2 Primary Arterial Insufficiency Ulcer Etiology: Wound Location: Left, Posterior Lower Leg Wound Healed - Epithelialized Wounding Event: Gradually Appeared Status: Date Acquired: 11/21/2021 Comorbid Hypertension, Peripheral Arterial Disease, Peripheral  Venous Weeks Of Treatment: 15 History: Disease, Type II Diabetes, Osteoarthritis, Osteomyelitis Clustered Wound: No Photos Wound Measurements Length: (cm) Width: (cm) Depth: (cm) Area: (cm) Volume: (cm) 0 % Reduction in Area: 100% 0 % Reduction in Volume: 100% 0 Epithelialization: Medium (34-66%) 0 Tunneling: No 0 Undermining: No Wound Description Classification: Full Thickness Without Exposed Suppor Wound Margin: Distinct, outline attached Exudate Amount: Medium Exudate Type: Serosanguineous Exudate Color: red, brown t Structures Foul Odor After Cleansing: No Slough/Fibrino Yes Wound Bed Granulation Amount: Medium (34-66%) Exposed Structure Granulation Quality: Red, Pink, Hyper-granulation Fascia Exposed: No Necrotic Amount: Medium (34-66%) Fat Layer (Subcutaneous Tissue) Exposed: Yes Tendon Exposed: No Muscle Exposed: No Joint Exposed: No Bone Exposed: No Periwound Skin Texture Texture Color No Abnormalities Noted: No No Abnormalities Noted: No Callus: No Atrophie Blanche: No Crepitus: No Cyanosis: No Excoriation: No Ecchymosis: No Induration: No Erythema: No Rash: No Hemosiderin Staining: No Scarring: No Mottled: No Pallor: No Moisture Rubor: No No Abnormalities Noted: No Dry / Scaly: No Temperature / Pain LAVONNE, KINDERMAN ([id number]) [phone].pdf Page 8 of 8 Maceration: No Temperature: No Abnormality Tenderness on Palpation: Yes Treatment Notes Wound #2 (Lower Leg) Wound Laterality: Left, Posterior Cleanser Peri-Wound Care Topical Primary Dressing Secondary Dressing Secured With Compression Wrap Compression Stockings Add-Ons Electronic Signature(s) Signed: 05/24/2022 4:09:44 PM By: Rhae Hammock RN Entered By: Rhae Hammock on 05/18/2022 11:02:10 -------------------------------------------------------------------------------- Vitals Details Patient Name: Date of Service: [name] Houston RO N J. 05/18/2022  9:45 A M Medical Record Number: [id number] Patient Account Number: [id number] Date of Birth/Sex: Treating RN: 11/14/1955 (67 y.o. [name] Houston, [name] Primary Care Parissa Chiao: Harlan Stains Other Clinician: Referring Zenda Herskowitz: Treating Coila Wardell/Extender: [name] Weeks in Treatment: 15 Vital Signs Time Taken: 10:06 Temperature (F): 97.7 Height (in): 64 Pulse (bpm): 64 Weight (lbs): 197 Respiratory Rate (breaths/min): 17 Body Mass Index (BMI): 33.8 Blood Pressure (mmHg): 138/69 Capillary Blood Glucose (mg/dl): 85 Reference Range: 80 - 120 mg / dl Electronic Signature(s) Signed: 05/24/2022 4:09:44 PM By: Rhae Hammock RN Entered By: Rhae Hammock on 05/18/2022 10:07:11

## 2022-05-31 ENCOUNTER — Ambulatory Visit (INDEPENDENT_AMBULATORY_CARE_PROVIDER_SITE_OTHER)
Admission: RE | Admit: 2022-05-31 | Discharge: 2022-05-31 | Disposition: A | Discharge status: Home / Self Care | Payer: HMO | Source: Ambulatory Visit | Attending: Vascular Surgery | Admitting: Vascular Surgery

## 2022-05-31 ENCOUNTER — Ambulatory Visit (HOSPITAL_COMMUNITY)
Admission: RE | Admit: 2022-05-31 | Discharge: 2022-05-31 | Disposition: A | Discharge status: Home / Self Care | Payer: HMO | Source: Ambulatory Visit | Attending: Vascular Surgery | Admitting: Vascular Surgery

## 2022-05-31 ENCOUNTER — Other Ambulatory Visit: Payer: Self-pay

## 2022-05-31 ENCOUNTER — Ambulatory Visit (INDEPENDENT_AMBULATORY_CARE_PROVIDER_SITE_OTHER): Payer: HMO | Admitting: Physician Assistant

## 2022-05-31 VITALS — BP 164/94 | HR 66 | Temp 98.0°F | Ht 64.0 in | Wt 202.0 lb

## 2022-05-31 DIAGNOSIS — I70262 Atherosclerosis of native arteries of extremities with gangrene, left leg: Secondary | ICD-10-CM

## 2022-05-31 DIAGNOSIS — I739 Peripheral vascular disease, unspecified: Secondary | ICD-10-CM | POA: Diagnosis not present

## 2022-05-31 LAB — VAS US ABI WITH/WO TBI
Left ABI: 0.93
Right ABI: 0.56

## 2022-06-01 NOTE — Progress Notes (Signed)
Established Previous Bypass   History of Present Illness   [name] Houston is a 67 y.o. (Nov 27, 1955) female who presents for surveillance of PAD.  She has a history of left iliofemoral endarterectomy with vein patch angioplasty and common femoral to above-knee popliteal artery bypass with PTFE by Dr. Donzetta Matters on 12/06/2021.  This was done to improve blood flow for a left calf ulceration.  She then required left groin washout and wound VAC placement on 12/13/2021.  Since then the patient's groin incision has healed well.  She was going to the wound care center for treatment of the left calf wound.  At follow-up today, she is doing well.  She states that she was recently "graduated" from the wound care center.  Her left calf ulceration is nearly healed.  She now dresses it daily with ointment and places the bandage on top of it.  She denies any rest pain, claudication, or other tissue loss.  She is also ready to go back to work.  She is still taking an aspirin and statin daily.  Current Outpatient Medications  Medication Sig Dispense Refill   acetaminophen (TYLENOL) 500 MG tablet Take 1,000 mg by mouth every 6 (six) hours as needed for moderate pain.     amLODipine (NORVASC) 10 MG tablet Take 10 mg by mouth daily.     aspirin 81 MG tablet Take 81 mg by mouth daily.     atorvastatin (LIPITOR) 40 MG tablet Take 40 mg by mouth daily.     chlorthalidone (HYGROTON) 25 MG tablet Take 25 mg by mouth daily.     cholecalciferol (VITAMIN D3) 25 MCG (1000 UNIT) tablet Take 1,000 Units by mouth daily.     Cyanocobalamin (B-12-SL) 1000 MCG SUBL Place 1,000 mcg under the tongue daily.     dapagliflozin propanediol (FARXIGA) 10 MG TABS tablet Take 10 mg by mouth daily.     famotidine (PEPCID) 40 MG tablet Take 40 mg by mouth See admin instructions. Take 40 mg daily, may take a second 40 mg dose as needed for heartburn     glipiZIDE (GLUCOTROL) 5 MG tablet Take 5 mg by mouth 2 (two) times daily.      Magnesium 250 MG TABS Take 250 mg by mouth 2 (two) times daily.     oxyCODONE-acetaminophen (PERCOCET/ROXICET) 5-325 MG tablet Take 1 tablet by mouth every 6 (six) hours as needed for severe pain. 10 tablet 0   pioglitazone (ACTOS) 15 MG tablet Take 15 mg by mouth daily.     potassium chloride SA (KLOR-CON M) 20 MEQ tablet Take 20 mEq by mouth daily.     Semaglutide,0.25 or 0.'5MG'$ /DOS, (OZEMPIC, 0.25 OR 0.5 MG/DOSE,) 2 MG/3ML SOPN Take 0.5 mg by mouth every Thursday.     No current facility-administered medications for this visit.    REVIEW OF SYSTEMS (negative unless checked):   Cardiac:  '[]'$  Chest pain or chest pressure? '[]'$  Shortness of breath upon activity? '[]'$  Shortness of breath when lying flat? '[]'$  Irregular heart rhythm?  Vascular:  '[]'$  Pain in calf, thigh, or hip brought on by walking? '[]'$  Pain in feet at night that wakes you up from your sleep? '[]'$  Blood clot in your veins? '[]'$  Leg swelling?  Pulmonary:  '[]'$  Oxygen at home? '[]'$  Productive cough? '[]'$  Wheezing?  Neurologic:  '[]'$  Sudden weakness in arms or legs? '[]'$  Sudden numbness in arms or legs? '[]'$  Sudden onset of difficult speaking or slurred speech? '[]'$  Temporary loss of vision in one eye? '[]'$   Problems with dizziness?  Gastrointestinal:  '[]'$  Blood in stool? '[]'$  Vomited blood?  Genitourinary:  '[]'$  Burning when urinating? '[]'$  Blood in urine?  Psychiatric:  '[]'$  Major depression  Hematologic:  '[]'$  Bleeding problems? '[]'$  Problems with blood clotting?  Dermatologic:  '[]'$  Rashes or ulcers?  Constitutional:  '[]'$  Fever or chills?  Ear/Nose/Throat:  '[]'$  Change in hearing? '[]'$  Nose bleeds? '[]'$  Sore throat?  Musculoskeletal:  '[]'$  Back pain? '[]'$  Joint pain? '[]'$  Muscle pain?   Physical Examination   Vitals:   05/31/22 1427  BP: (!) 164/94  Pulse: 66  Temp: 98 F (36.7 C)  TempSrc: Temporal  SpO2: 100%  Weight: 202 lb (91.6 kg)  Height: '5\' 4"'$  (1.626 m)   Body mass index is 34.67 kg/m.  General:  WDWN in NAD; vital  signs documented above Gait: Not observed HENT: WNL, normocephalic Pulmonary: normal non-labored breathing  Cardiac: regular rate and rhythm Abdomen: soft, NT, no masses Skin: without rashes Vascular Exam/Pulses: biphasic left DP/PT doppler signals. Monophasic right PT/DP doppler signals Extremities: Without ischemic changes or gangrene.  Nearly healed left calf ulceration Musculoskeletal: no muscle wasting or atrophy  Neurologic: A&O X 3;  No focal weakness or paresthesias are detected Psychiatric:  The pt has Normal affect.  Non-Invasive Vascular Imaging ABI (05/31/2022) +-------+-----------+-----------+------------+------------+  ABI/TBIToday's ABIToday's TBIPrevious ABIPrevious TBI  +-------+-----------+-----------+------------+------------+  Right 0.56       0.40       0.73        0.50          +-------+-----------+-----------+------------+------------+  Left  0.93       0.58       not obtained0.66          +-------+-----------+-----------+------------+------------+   Bypass Duplex (05/31/2022) +--------+--------+-----+--------+--------+--------+  LEFT   PSV cm/sRatioStenosisWaveformComments  +--------+--------+-----+--------+--------+--------+  CFA Prox226                  biphasic          +--------+--------+-----+--------+--------+--------+  POP Mid 97                   biphasic          +--------+--------+-----+--------+--------+--------+     Left Graft #1:  +--------------------+--------+--------+----------+--------+                     PSV cm/sStenosisWaveform  Comments  +--------------------+--------+--------+----------+--------+  Inflow             136             biphasic            +--------------------+--------+--------+----------+--------+  Proximal Anastomosis142             biphasic  dampened  +--------------------+--------+--------+----------+--------+  Proximal Graft      90              biphasic   broad     +--------------------+--------+--------+----------+--------+  Mid Graft           74              monophasic          +--------------------+--------+--------+----------+--------+  Distal Graft        58              biphasic            +--------------------+--------+--------+----------+--------+  Distal Anastomosis  141             biphasic  broad     +--------------------+--------+--------+----------+--------+  Outflow  114             biphasic  broad     +--------------------+--------+--------+----------+--------+    Medical Decision Making   [name] Houston is a 67 y.o. female who presents for surveillance of PAD  Based on the patient's vascular studies, her right ABI is appear decreased from 0.73 to 0.56.  Her left ABI today is 0.93. Duplex of the left lower extremity bypass graft demonstrates a patent graft with no signs of stenosis.  There is biphasic inflow and outflow. She has biphasic left lower extremity Doppler signals.  She is without rest pain or claudication.  Her left calf ulceration is nearly healed and now only requires ointment and bandaging by the patient daily. She will continue her aspirin and statin.  She can follow-up with our office in 9 months for ABIs and repeat left lower extremity bypass graft duplex study   Vicente Serene PA-C [facility]: [facility] MD: Donzetta Matters

## 2022-06-13 ENCOUNTER — Encounter (HOSPITAL_BASED_OUTPATIENT_CLINIC_OR_DEPARTMENT_OTHER): Payer: HMO | Attending: Internal Medicine | Admitting: Internal Medicine

## 2022-06-13 DIAGNOSIS — E11622 Type 2 diabetes mellitus with other skin ulcer: Secondary | ICD-10-CM | POA: Insufficient documentation

## 2022-06-13 DIAGNOSIS — I129 Hypertensive chronic kidney disease with stage 1 through stage 4 chronic kidney disease, or unspecified chronic kidney disease: Secondary | ICD-10-CM | POA: Insufficient documentation

## 2022-06-13 DIAGNOSIS — I70248 Atherosclerosis of native arteries of left leg with ulceration of other part of lower left leg: Secondary | ICD-10-CM | POA: Insufficient documentation

## 2022-06-13 DIAGNOSIS — Z833 Family history of diabetes mellitus: Secondary | ICD-10-CM | POA: Diagnosis not present

## 2022-06-13 DIAGNOSIS — E1151 Type 2 diabetes mellitus with diabetic peripheral angiopathy without gangrene: Secondary | ICD-10-CM | POA: Diagnosis not present

## 2022-06-13 DIAGNOSIS — L97828 Non-pressure chronic ulcer of other part of left lower leg with other specified severity: Secondary | ICD-10-CM | POA: Diagnosis not present

## 2022-06-13 DIAGNOSIS — E1122 Type 2 diabetes mellitus with diabetic chronic kidney disease: Secondary | ICD-10-CM | POA: Diagnosis not present

## 2022-06-13 DIAGNOSIS — N183 Chronic kidney disease, stage 3 unspecified: Secondary | ICD-10-CM | POA: Diagnosis not present

## 2022-06-13 DIAGNOSIS — I890 Lymphedema, not elsewhere classified: Secondary | ICD-10-CM | POA: Insufficient documentation

## 2022-06-13 NOTE — Progress Notes (Signed)
[name] Houston, [name] Houston ([id number]) [id number] Page 1 of 7 Visit Report for 06/13/2022 Chief Complaint Document Details Patient Name: Date of Service: [name], Houston 06/13/2022 12:30 PM Medical Record Number: [id number] Patient Account Number: [id number] Date of Birth/Sex: Treating RN: [birth year] (67 y.o. F) Primary Care Provider: Harlan Stains Other Clinician: Referring Provider: Treating Provider/Extender: Jake Church in Treatment: 24 Information Obtained from: Patient Chief Complaint 10/20/2021; Left posterior leg wound 01/31/2022; left posterior leg wound Electronic Signature(s) Signed: 06/13/2022 1:46:46 PM By: Kalman Shan DO Entered By: Kalman Shan on 06/13/2022 13:04:59 -------------------------------------------------------------------------------- HPI Details Patient Name: Date of Service: [name] Houston RO N J. 06/13/2022 12:30 PM Medical Record Number: [id number] Patient Account Number: [id number] Date of Birth/Sex: Treating RN: Sep 24, 1955 (67 y.o. F) Primary Care Provider: Harlan Stains Other Clinician: Referring Provider: Treating Provider/Extender: Jake Church in Treatment: 19 History of Present Illness HPI Description: Admission 06/26/2021 [name] Houston is a 67 year old female with a past medical history of uncontrolled type 2 diabetes on oral agents with last hemoglobin A1c of 9, peripheral arterial disease that presents to the clinic for a 3-week history of worsening wound to the posterior aspect of her left leg. She is not sure how it started. She has been using normal saline to the wound bed. She had arterial segmental pressures that showed a left ABI of 0.52 she is scheduled to see vein and vascular on 7/5. She is currently on doxycycline prescribed by her primary care doctor. She currently denies systemic signs of infection. 7/7; patient presents for follow-up. She saw  Dr. Donzetta Matters yesterday, vein and vascular to discuss her arterial status. He is scheduling her for an arteriogram for possible intervention. She has had no issues with the Medihoney. 7/24; patient presents for follow-up. She continues to use Medihoney. She is scheduled to have a stress test on 7/28 in order to be cleared for vascular surgery. Plan is for left common femoral endarterectomy and left common femoral to below the knee popliteal artery bypass. Readmission 01/31/2022 Patient had a left iliofemoral endarterectomy with vein patch angioplasty and common femoral to above-the-knee popliteal bypass with PTFE by Dr. Donzetta Matters on 12/06/2021. She had follow-up with vein and vascular on 01/18/2022 and it was noted that her left foot is well-perfused with brisk DP and PT Doppler signals. She has follow-up again with vein and vascular at the end of this month for bypass duplex and ABIs. She is currently been using Medihoney to the wound bed. She reports improvement to her chronic pain. She currently denies signs of infection. 10/10; patient presents for follow-up. She has been she has been using Dakin's wet-to-dry dressings however she was having pain on dressing changes. We switched to [facility] in the week. She is done better with this. She has no issues or complaints today. 10/17; patient presents for follow-up. She has been using Santyl and Hydrofera Blue to the wound bed. There is been improvement in wound healing. She has no issues or complaints today. 10/31; patient presents for follow-up. She has been using Santyl and Hydrofera Blue to the wound bed. She has no issues or complaints today. She saw vein and vascular on 10/25 they repeated a TBI on the left which was 0.66. She had a bypass graft duplex that showed biphasic waveforms of throughout the left lower extremity 11/10; patient presents for follow-up. She has recently run out of the [place] and has continued to use Hydrofera Blue to the wound bed.  She  has no issues or complaints today. [name], Houston ([id number]) [id number] Page 2 of 7 11/16; patient presents for follow-up. She states she still had a little bit of Santyl left in the tube that she was able to use to the wound bed this past week. She is continued with Hydrofera Blue. She has been using the Tubigrip. She has no issues or complaints today. 11/30; patient presents for follow-up. She states she continues to have a little bit of Santyl left and has been using this with Hydrofera Blue. She knows to use Medihoney once this runs out. She has been using Tubigrip. She has no issues or complaints today. 12/5; patient presents for follow-up. She is approved for Apligraf and this was available for placement today. She has no issues or complaints today. She has home health. 12/15; patient presents for follow-up. Apligraf was placed in standard fashion at last clinic visit under kerlix/coban. She did well with this. The wound is measuring smaller. 12/28; patient presents for follow-up. Apligraf was placed at last clinic visit. She has home health to change the wrap. Apligraf was kept in place for the past 2 weeks. She has tolerated this well. The wound is measuring smaller. 1/9; patient presents for follow-up. Apligraf was placed at last clinic visit. Home health is changing the wrap once weekly. She has no issues or complaints today. Due to the new year insurance was run again for apligraf. Due to high out-of-pocket cost patient declined proceeding with this. 1/18; patient presents for follow-up. We have been using collagen under Kerlix/Coban. She has home health and they have changed the dressing. The collagen is stuck to her skin. She has no issues or complaints today. 06/13/2022; patient has been wearing her for compression stockings daily. She called because she was concerned about a small blister to her left lower extremity. She has no open wounds. Her  original wound still remains healed. She denies signs of infection. Electronic Signature(s) Signed: 06/13/2022 1:46:46 PM By: Kalman Shan DO Entered By: Kalman Shan on 06/13/2022 13:09:25 -------------------------------------------------------------------------------- Physical Exam Details Patient Name: Date of Service: [name] Houston RO N J. 06/13/2022 12:30 PM Medical Record Number: [id number] Patient Account Number: [id number] Date of Birth/Sex: Treating RN: [birth year] (67 y.o. F) Primary Care Provider: Harlan Stains Other Clinician: Referring Provider: Treating Provider/Extender: Jake Church in Treatment: 19 Constitutional respirations regular, non-labored and within target range for patient.. Cardiovascular 2+ dorsalis pedis/posterior tibialis pulses. Psychiatric pleasant and cooperative. Notes Left lower extremity: Small blister to the posterior aspect. No open wound. No signs of surrounding infection including increased warmth, erythema or purulent drainage. Electronic Signature(s) Signed: 06/13/2022 1:46:46 PM By: Kalman Shan DO Entered By: Kalman Shan on 06/13/2022 13:11:03 -------------------------------------------------------------------------------- Physician Orders Details Patient Name: Date of Service: [name] Houston RO N J. 06/13/2022 12:30 PM Medical Record Number: [id number] Patient Account Number: [id number] Date of Birth/Sex: Treating RN: 07-10-1955 (67 y.o. [name], [name] Primary Care Provider: Harlan Stains Other Clinician: Referring Provider: Treating Provider/Extender: [name] Hurst, [name] ([id number]) [id number] Page 3 of 7 Weeks in Treatment: 70 Verbal / Phone Orders: No Diagnosis Coding Follow-up Appointments ppointment in 2 weeks. - w/ Dr. Heber [place] and Allayne Butcher Rm # 9 Tuesday 06/27/22 @ 12:30 Return A Other: - Keep blister covered. If it opens  up, use your medihoney and hydraferablue and give [place] a call. Edema Control - Lymphedema / SCD / Other Elevate legs to the level of the heart or above for 30 minutes daily and/or  when sitting for 3-4 times a day throughout the day. Avoid standing for long periods of time. Patient to wear own compression stockings every day. - ***Left leg ONLY*** Electronic Signature(s) Signed: 06/13/2022 1:46:46 PM By: Kalman Shan DO Entered By: Kalman Shan on 06/13/2022 13:11:10 -------------------------------------------------------------------------------- Problem List Details Patient Name: Date of Service: [name] Houston RO N J. 06/13/2022 12:30 PM Medical Record Number: [id number] Patient Account Number: [id number] Date of Birth/Sex: Treating RN: 1955-06-08 (67 y.o. F) Primary Care Provider: Harlan Stains Other Clinician: Referring Provider: Treating Provider/Extender: Jake Church in Treatment: 19 Active Problems ICD-10 Encounter Code Description Active Date MDM Diagnosis I70.248 Atherosclerosis of native arteries of left leg with ulceration of other part of 01/31/2022 No Yes lower leg L97.828 Non-pressure chronic ulcer of other part of left lower leg with other specified 01/31/2022 No Yes severity E11.622 Type 2 diabetes mellitus with other skin ulcer 01/31/2022 No Yes Inactive Problems Resolved Problems Electronic Signature(s) Signed: 06/13/2022 1:46:46 PM By: Kalman Shan DO Entered By: Kalman Shan on 06/13/2022 13:04:47 -------------------------------------------------------------------------------- Progress Note Details Patient Name: Date of Service: [name] Houston RO N J. 06/13/2022 12:30 PM [name] Houston ([id number] Page 4 of 7 Medical Record Number: [id number] Patient Account Number: [id number] Date of Birth/Sex: Treating RN: 24-Apr-1956 (67 y.o. F) Primary Care Provider: Harlan Stains Other  Clinician: Referring Provider: Treating Provider/Extender: Jake Church in Treatment: 33 Subjective Chief Complaint Information obtained from Patient 10/20/2021; Left posterior leg wound 01/31/2022; left posterior leg wound History of Present Illness (HPI) Admission 06/26/2021 Ms. Trenyce Kammerdiener is a 67 year old female with a past medical history of uncontrolled type 2 diabetes on oral agents with last hemoglobin A1c of 9, peripheral arterial disease that presents to the clinic for a 3-week history of worsening wound to the posterior aspect of her left leg. She is not sure how it started. She has been using normal saline to the wound bed. She had arterial segmental pressures that showed a left ABI of 0.52 she is scheduled to see vein and vascular on 7/5. She is currently on doxycycline prescribed by her primary care doctor. She currently denies systemic signs of infection. 7/7; patient presents for follow-up. She saw Dr. Donzetta Matters yesterday, vein and vascular to discuss her arterial status. He is scheduling her for an arteriogram for possible intervention. She has had no issues with the Medihoney. 7/24; patient presents for follow-up. She continues to use Medihoney. She is scheduled to have a stress test on 7/28 in order to be cleared for vascular surgery. Plan is for left common femoral endarterectomy and left common femoral to below the knee popliteal artery bypass. Readmission 01/31/2022 Patient had a left iliofemoral endarterectomy with vein patch angioplasty and common femoral to above-the-knee popliteal bypass with PTFE by Dr. Donzetta Matters on 12/06/2021. She had follow-up with vein and vascular on 01/18/2022 and it was noted that her left foot is well-perfused with brisk DP and PT Doppler signals. She has follow-up again with vein and vascular at the end of this month for bypass duplex and ABIs. She is currently been using Medihoney to the wound bed. She reports improvement to her  chronic pain. She currently denies signs of infection. 10/10; patient presents for follow-up. She has been she has been using Dakin's wet-to-dry dressings however she was having pain on dressing changes. We switched to [facility] in the week. She is done better with this. She has no issues or complaints today. 10/17; patient presents for follow-up. She has  been using Santyl and Hydrofera Blue to the wound bed. There is been improvement in wound healing. She has no issues or complaints today. 10/31; patient presents for follow-up. She has been using Santyl and Hydrofera Blue to the wound bed. She has no issues or complaints today. She saw vein and vascular on 10/25 they repeated a TBI on the left which was 0.66. She had a bypass graft duplex that showed biphasic waveforms of throughout the left lower extremity 11/10; patient presents for follow-up. She has recently run out of the [place] and has continued to use Hydrofera Blue to the wound bed. She has no issues or complaints today. 11/16; patient presents for follow-up. She states she still had a little bit of Santyl left in the tube that she was able to use to the wound bed this past week. She is continued with Hydrofera Blue. She has been using the Tubigrip. She has no issues or complaints today. 11/30; patient presents for follow-up. She states she continues to have a little bit of Santyl left and has been using this with Hydrofera Blue. She knows to use Medihoney once this runs out. She has been using Tubigrip. She has no issues or complaints today. 12/5; patient presents for follow-up. She is approved for Apligraf and this was available for placement today. She has no issues or complaints today. She has home health. 12/15; patient presents for follow-up. Apligraf was placed in standard fashion at last clinic visit under kerlix/coban. She did well with this. The wound is measuring smaller. 12/28; patient presents for follow-up. Apligraf was  placed at last clinic visit. She has home health to change the wrap. Apligraf was kept in place for the past 2 weeks. She has tolerated this well. The wound is measuring smaller. 1/9; patient presents for follow-up. Apligraf was placed at last clinic visit. Home health is changing the wrap once weekly. She has no issues or complaints today. Due to the new year insurance was run again for apligraf. Due to high out-of-pocket cost patient declined proceeding with this. 1/18; patient presents for follow-up. We have been using collagen under Kerlix/Coban. She has home health and they have changed the dressing. The collagen is stuck to her skin. She has no issues or complaints today. 06/13/2022; patient has been wearing her for compression stockings daily. She called because she was concerned about a small blister to her left lower extremity. She has no open wounds. Her original wound still remains healed. She denies signs of infection. Patient History Information obtained from Patient, Caregiver. Family History Cancer - Father, Diabetes - Mother,Maternal Grandparents,Father, Hypertension - Mother, No family history of Heart Disease, Hereditary Spherocytosis, Kidney Disease, Lung Disease, Seizures, Stroke, Thyroid Problems, Tuberculosis. Social History Former smoker, Marital Status - Married, Alcohol Use - Rarely, Drug Use - No History, Caffeine Use - Daily. Medical History Eyes Denies history of Cataracts, Glaucoma, Optic Neuritis Cardiovascular Patient has history of Hypertension, Peripheral Arterial Disease, Peripheral Venous Disease Denies history of Angina, Arrhythmia, Coronary Artery Disease, Deep Vein Thrombosis, Hypotension, Myocardial Infarction, Phlebitis, Vasculitis Gastrointestinal Denies history of Cirrhosis , Colitis, Crohnoos, Hepatitis A, Hepatitis B, Hepatitis C Endocrine Patient has history of Type II Diabetes - A1C-9.1 [name] Houston, [name] Houston ([id number])  [id number] Page 5 of 7 Denies history of Type I Diabetes Immunological Denies history of Lupus Erythematosus, Raynaudoos, Scleroderma Musculoskeletal Patient has history of Osteoarthritis, Osteomyelitis - Hx ankle Denies history of Gout, Rheumatoid Arthritis Neurologic Denies history of Neuropathy, Quadriplegia, Paraplegia, Seizure Disorder Hospitalization/Surgery History -  left knee. Medical A Surgical History Notes nd Genitourinary CKD-3 Objective Constitutional respirations regular, non-labored and within target range for patient.. Vitals Time Taken: 12:47 PM, Height: 64 in, Weight: 197 lbs, BMI: 33.8, Temperature: 98.1 F, Pulse: 73 bpm, Respiratory Rate: 17 breaths/min, Blood Pressure: 151/87 mmHg, Capillary Blood Glucose: 100 mg/dl. Cardiovascular 2+ dorsalis pedis/posterior tibialis pulses. Psychiatric pleasant and cooperative. General Notes: Left lower extremity: Small blister to the posterior aspect. No open wound. No signs of surrounding infection including increased warmth, erythema or purulent drainage. Assessment Active Problems ICD-10 Atherosclerosis of native arteries of left leg with ulceration of other part of lower leg Non-pressure chronic ulcer of other part of left lower leg with other specified severity Type 2 diabetes mellitus with other skin ulcer Patient was last seen on 05/18/2022 and at that time her wound had healed. This has remained closed. She has been wearing compression stockings daily. Unfortunately she has developed a very small blister to the posterior aspect of the leg. At this time I recommended keeping this intact. Once it opens she can place bacitracin ointment over the area which she has at home. Continue compression stockings. Follow-up in 2 weeks. Plan Follow-up Appointments: Return Appointment in 2 weeks. - w/ Dr. Heber [place] and Allayne Butcher Rm # 9 Tuesday 06/27/22 @ 12:30 Other: - Keep blister covered. If it opens  up, use your medihoney and hydraferablue and give [place] a call. Edema Control - Lymphedema / SCD / Other: Elevate legs to the level of the heart or above for 30 minutes daily and/or when sitting for 3-4 times a day throughout the day. Avoid standing for long periods of time. Patient to wear own compression stockings every day. - ***Left leg ONLY*** 1. Follow-up in 2 weeks 2. Compression stockings daily Electronic Signature(s) Signed: 06/13/2022 1:46:46 PM By: Kalman Shan DO Entered By: Kalman Shan on 06/13/2022 13:14:23 [name] Houston ([id number]) [id number] Page 6 of 7 -------------------------------------------------------------------------------- HxROS Details Patient Name: Date of Service: [name] Houston, [name] Houston 06/13/2022 12:30 PM Medical Record Number: [id number] Patient Account Number: [id number] Date of Birth/Sex: Treating RN: 10-06-1955 (67 y.o. F) Primary Care Provider: Harlan Stains Other Clinician: Referring Provider: Treating Provider/Extender: Jake Church in Treatment: 97 Information Obtained From Patient Caregiver Eyes Medical History: Negative for: Cataracts; Glaucoma; Optic Neuritis Cardiovascular Medical History: Positive for: Hypertension; Peripheral Arterial Disease; Peripheral Venous Disease Negative for: Angina; Arrhythmia; Coronary Artery Disease; Deep Vein Thrombosis; Hypotension; Myocardial Infarction; Phlebitis; Vasculitis Gastrointestinal Medical History: Negative for: Cirrhosis ; Colitis; Crohns; Hepatitis A; Hepatitis B; Hepatitis C Endocrine Medical History: Positive for: Type II Diabetes - A1C-9.1 Negative for: Type I Diabetes Time with diabetes: 12 years Treated with: Oral agents Blood sugar tested every day: No Genitourinary Medical History: Past Medical History Notes: CKD-3 Immunological Medical History: Negative for: Lupus Erythematosus; Raynauds;  Scleroderma Musculoskeletal Medical History: Positive for: Osteoarthritis; Osteomyelitis - Hx ankle Negative for: Gout; Rheumatoid Arthritis Neurologic Medical History: Negative for: Neuropathy; Quadriplegia; Paraplegia; Seizure Disorder Immunizations Pneumococcal Vaccine: Received Pneumococcal Vaccination: No Implantable Devices None Hospitalization / Surgery History Type of Hospitalization/Surgery KRISTIAN, [name] Houston ([id number]) [id number] Page 7 of 7 left knee Family and Social History Cancer: Yes - Father; Diabetes: Yes - Mother,Maternal Grandparents,Father; Heart Disease: No; Hereditary Spherocytosis: No; Hypertension: Yes - Mother; Kidney Disease: No; Lung Disease: No; Seizures: No; Stroke: No; Thyroid Problems: No; Tuberculosis: No; Former smoker; Marital Status - Married; Alcohol Use: Rarely; Drug Use: No History; Caffeine Use: Daily; Financial Concerns: No; Food, Clothing or Shelter Needs:  No; Support System Lacking: No; Transportation Concerns: No Electronic Signature(s) Signed: 06/13/2022 1:46:46 PM By: Kalman Shan DO Entered By: Kalman Shan on 06/13/2022 13:09:31 -------------------------------------------------------------------------------- SuperBill Details Patient Name: Date of Service: [name] Houston RO Liliana Cline. 06/13/2022 Medical Record Number: [id number] Patient Account Number: [id number] Date of Birth/Sex: Treating RN: 09-12-1955 (67 y.o. [name], [name] Primary Care Provider: Harlan Stains Other Clinician: Referring Provider: Treating Provider/Extender: Jake Church in Treatment: 19 Diagnosis Coding ICD-10 Codes Code Description I70.248 Atherosclerosis of native arteries of left leg with ulceration of other part of lower leg L97.828 Non-pressure chronic ulcer of other part of left lower leg with other specified severity E11.622 Type 2 diabetes mellitus with other skin ulcer Facility  Procedures : CPT4 Code: [id number] Description: [phone] - WOUND CARE VISIT-LEV 2 EST PT Modifier: Quantity: 1 Physician Procedures : CPT4 Code Description Modifier [id number] - WC PHYS LEVEL 3 - EST PT ICD-10 Diagnosis Description I70.248 Atherosclerosis of native arteries of left leg with ulceration of other part of lower leg L97.828 Non-pressure chronic ulcer of other part of  left lower leg with other specified severity E11.622 Type 2 diabetes mellitus with other skin ulcer Quantity: 1 Electronic Signature(s) Signed: 06/13/2022 1:46:46 PM By: Kalman Shan DO Entered By: Kalman Shan on 06/13/2022 13:14:42

## 2022-06-15 NOTE — Progress Notes (Signed)
LURANA, KOERNER ([id number]) [id number] Page 1 of 6 Visit Report for 06/13/2022 Arrival Information Details Patient Name: Date of Service: EMMERI, BREI 06/13/2022 12:30 PM Medical Record Number: [id number] Patient Account Number: [id number] Date of Birth/Sex: Treating RN: [birth year] (67 y.o. [name], [name] Primary Care Dahl Higinbotham: Harlan Stains Other Clinician: Referring Shirleen Mcfaul: Treating Antionio Negron/Extender: Jake Church in Treatment: 67 Visit Information History Since Last Visit Added or deleted any medications: No Patient Arrived: Ambulatory Any new allergies or adverse reactions: No Arrival Time: 12:49 Had a fall or experienced change in No Accompanied By: husband activities of daily living that may affect Transfer Assistance: None risk of falls: Patient Identification Verified: Yes Signs or symptoms of abuse/neglect since last visito No Secondary Verification Process Completed: Yes Hospitalized since last visit: No Patient Requires Transmission-Based Precautions: No Implantable device outside of the clinic excluding No Patient Has Alerts: No cellular tissue based products placed in the center since last visit: Pain Present Now: No Electronic Signature(s) Signed: 06/14/2022 4:47:24 PM By: Rhae Hammock RN Entered By: Rhae Hammock on 06/13/2022 12:49:54 -------------------------------------------------------------------------------- Clinic Level of Care Assessment Details Patient Name: Date of Service: ANELISE, ULATOWSKI. 06/13/2022 12:30 PM Medical Record Number: [id number] Patient Account Number: [id number] Date of Birth/Sex: Treating RN: February 20, 1956 (67 y.o. [name], [name] Primary Care Janet Decesare: Harlan Stains Other Clinician: Referring Karri Kallenbach: Treating Tony Granquist/Extender: Jake Church in Treatment: 19 Clinic Level of Care Assessment Items TOOL 4 Quantity  Score X- 1 0 Use when only an EandM is performed on FOLLOW-UP visit ASSESSMENTS - Nursing Assessment / Reassessment X- 1 10 Reassessment of Co-morbidities (includes updates in patient status) X- 1 5 Reassessment of Adherence to Treatment Plan ASSESSMENTS - Wound and Skin A ssessment / Reassessment X - Simple Wound Assessment / Reassessment - one wound 1 5 []$  - 0 Complex Wound Assessment / Reassessment - multiple wounds []$  - 0 Dermatologic / Skin Assessment (not related to wound area) ASSESSMENTS - Focused Assessment []$  - 0 Circumferential Edema Measurements - multi extremities []$  - 0 Nutritional Assessment / Counseling / Intervention []$  - 0 Lower Extremity Assessment (monofilament, tuning fork, pulses) ROSEMARIA, ROSENMAN ([id number]) [phone] Page 2 of 6 []$  - 0 Peripheral Arterial Disease Assessment (using hand held doppler) ASSESSMENTS - Ostomy and/or Continence Assessment and Care []$  - 0 Incontinence Assessment and Management []$  - 0 Ostomy Care Assessment and Management (repouching, etc.) PROCESS - Coordination of Care X - Simple Patient / Family Education for ongoing care 1 15 []$  - 0 Complex (extensive) Patient / Family Education for ongoing care X- 1 10 Staff obtains Programmer, systems, Records, T Results / Process Orders est []$  - 0 Staff telephones HHA, Nursing Homes / Clarify orders / etc []$  - 0 Routine Transfer to another Facility (non-emergent condition) []$  - 0 Routine Hospital Admission (non-emergent condition) []$  - 0 New Admissions / Biomedical engineer / Ordering NPWT Apligraf, etc. , []$  - 0 Emergency Hospital Admission (emergent condition) X- 1 10 Simple Discharge Coordination []$  - 0 Complex (extensive) Discharge Coordination PROCESS - Special Needs []$  - 0 Pediatric / Minor Patient Management []$  - 0 Isolation Patient Management []$  - 0 Hearing / Language / Visual special needs []$  - 0 Assessment of Community assistance  (transportation, D/C planning, etc.) []$  - 0 Additional assistance / Altered mentation []$  - 0 Support Surface(s) Assessment (bed, cushion, seat, etc.) INTERVENTIONS - Wound Cleansing / Measurement []$  - 0 Simple Wound Cleansing - one wound []$  -  0 Complex Wound Cleansing - multiple wounds []$  - 0 Wound Imaging (photographs - any number of wounds) []$  - 0 Wound Tracing (instead of photographs) []$  - 0 Simple Wound Measurement - one wound []$  - 0 Complex Wound Measurement - multiple wounds INTERVENTIONS - Wound Dressings X - Small Wound Dressing one or multiple wounds 1 10 []$  - 0 Medium Wound Dressing one or multiple wounds []$  - 0 Large Wound Dressing one or multiple wounds X- 1 5 Application of Medications - topical []$  - 0 Application of Medications - injection INTERVENTIONS - Miscellaneous []$  - 0 External ear exam []$  - 0 Specimen Collection (cultures, biopsies, blood, body fluids, etc.) []$  - 0 Specimen(s) / Culture(s) sent or taken to Lab for analysis []$  - 0 Patient Transfer (multiple staff / Civil Service fast streamer / Similar devices) []$  - 0 Simple Staple / Suture removal (25 or less) []$  - 0 Complex Staple / Suture removal (26 or more) []$  - 0 Hypo / Hyperglycemic Management (close monitor of Blood Glucose) []$  - 0 Ankle / Brachial Index (ABI) - do not check if billed separately TEKEYAH, GUNDRY ([id number]) [phone].pdf Page 3 of 6 X- 1 5 Vital Signs Has the patient been seen at the hospital within the last three years: Yes Total Score: 75 Level Of Care: New/Established - Level 2 Electronic Signature(s) Signed: 06/14/2022 4:47:24 PM By: Rhae Hammock RN Entered By: Rhae Hammock on 06/13/2022 13:08:39 -------------------------------------------------------------------------------- Encounter Discharge Information Details Patient Name: Date of Service: [name] 06/13/2022 12:30 PM Medical Record Number: [id number] Patient Account Number:  [id number] Date of Birth/Sex: Treating RN: 09-02-1955 (50 y.o. [name], [name] Primary Care Tannon Peerson: Harlan Stains Other Clinician: Referring Pleas Carneal: Treating Isabellarose Kope/Extender: Jake Church in Treatment: 24 Encounter Discharge Information Items Discharge Condition: Stable Ambulatory Status: Ambulatory Discharge Destination: Home Transportation: Private Auto Accompanied By: husband Schedule Follow-up Appointment: Yes Clinical Summary of Care: Patient Declined Electronic Signature(s) Signed: 06/14/2022 4:47:24 PM By: Rhae Hammock RN Entered By: Rhae Hammock on 06/13/2022 13:09:36 -------------------------------------------------------------------------------- Lower Extremity Assessment Details Patient Name: Date of Service: [name] 06/13/2022 12:30 PM Medical Record Number: [id number] Patient Account Number: [id number] Date of Birth/Sex: Treating RN: 03/29/1956 (67 y.o. [name], [name] Primary Care Zamarion Longest: Harlan Stains Other Clinician: Referring Alexxia Stankiewicz: Treating Christinea Brizuela/Extender: [name] Weeks in Treatment: 19 Edema Assessment Assessed: [Left: Yes] [Right: No] Edema: [Left: N] [Right: o] Calf Left: Right: Point of Measurement: 29 cm From Medial Instep 37.5 cm Ankle Left: Right: Point of Measurement: 7 cm From Medial Instep 22 cm Vascular Assessment Pulses: LEVAEH, GOYETTE ([id number]) [Right:[id number] Page 4 of 6] Dorsalis Pedis Palpable: [Left:Yes] Posterior Tibial Palpable: [Left:Yes] Electronic Signature(s) Signed: 06/14/2022 4:47:24 PM By: Rhae Hammock RN Entered By: Rhae Hammock on 06/13/2022 12:50:11 -------------------------------------------------------------------------------- Multi Wound Chart Details Patient Name: Date of Service: [name] 06/13/2022 12:30 PM Medical Record Number: [id number] Patient Account Number:  [id number] Date of Birth/Sex: Treating RN: 1955-06-29 (67 y.o. F) Primary Care Tatum Corl: Harlan Stains Other Clinician: Referring Juliany Daughety: Treating Keysha Damewood/Extender: Jake Church in Treatment: 19 Vital Signs Height(in): 64 Capillary Blood Glucose(mg/dl): 100 Weight(lbs): 197 Pulse(bpm): 73 Body Mass Index(BMI): 33.8 Blood Pressure(mmHg): 151/87 Temperature(F): 98.1 Respiratory Rate(breaths/min): 17 [Treatment Notes:Wound Assessments Treatment Notes] Electronic Signature(s) Signed: 06/13/2022 1:46:46 PM By: Kalman Shan DO Entered By: Kalman Shan on 06/13/2022 13:04:52 -------------------------------------------------------------------------------- Multi-Disciplinary Care Plan Details Patient Name: Date of Service: [name] 06/13/2022 12:30 PM Medical Record  Number: [id number] Patient Account Number: [id number] Date of Birth/Sex: Treating RN: 1955-10-09 (67 y.o. [name], [name] Primary Care Shannia Jacuinde: Harlan Stains Other Clinician: Referring Selin Eisler: Treating Ihor Meinzer/Extender: [name] Weeks in Treatment: 27 Active Inactive Electronic Signature(s) Signed: 06/14/2022 4:47:24 PM By: Rhae Hammock RN Entered By: Rhae Hammock on 06/13/2022 12:59:56 [name] ([id number] Page 5 of 6 -------------------------------------------------------------------------------- Pain Assessment Details Patient Name: Date of Service: LEILANEE, MORETTA 06/13/2022 12:30 PM Medical Record Number: [id number] Patient Account Number: [id number] Date of Birth/Sex: Treating RN: [birth year] (67 y.o. [name], [name] Primary Care Shaquille Janes: Harlan Stains Other Clinician: Referring Raygan Skarda: Treating Zafiro Routson/Extender: [name] Weeks in Treatment: 19 Active Problems Location of Pain Severity and Description of Pain Patient Has Paino No Site  Locations Pain Management and Medication Current Pain Management: Electronic Signature(s) Signed: 06/14/2022 4:47:24 PM By: Rhae Hammock RN Entered By: Rhae Hammock on 06/13/2022 12:50:03 -------------------------------------------------------------------------------- Patient/Caregiver Education Details Patient Name: Date of Service: [name] 2/13/2024andnbsp12:30 PM Medical Record Number: [id number] Patient Account Number: [id number] Date of Birth/Gender: Treating RN: January 11, 1956 (67 y.o. [name], [name] Primary Care Physician: Harlan Stains Other Clinician: Referring Physician: Treating Physician/Extender: Jake Church in Treatment: 72 Education Assessment Education Provided To: Patient Education Topics Provided Wound/Skin Impairment: Methods: Explain/Verbal Responses: Reinforcements needed, State content correctly JANITA, CROSHAW ([id number]) [phone].pdf Page 6 of 6 Electronic Signature(s) Signed: 06/14/2022 4:47:24 PM By: Rhae Hammock RN Entered By: Rhae Hammock on 06/13/2022 13:00:28 -------------------------------------------------------------------------------- Vitals Details Patient Name: Date of Service: [name] 06/13/2022 12:30 PM Medical Record Number: [id number] Patient Account Number: [id number] Date of Birth/Sex: Treating RN: [birth year] (67 y.o. [name], [name] Primary Care Howard Bunte: Harlan Stains Other Clinician: Referring Jahanna Raether: Treating Arbie Reisz/Extender: [name] Weeks in Treatment: 19 Vital Signs Time Taken: 12:47 Temperature (F): 98.1 Height (in): 64 Pulse (bpm): 73 Weight (lbs): 197 Respiratory Rate (breaths/min): 17 Body Mass Index (BMI): 33.8 Blood Pressure (mmHg): 151/87 Capillary Blood Glucose (mg/dl): 100 Reference Range: 80 - 120 mg / dl Electronic Signature(s) Signed: 06/14/2022 4:47:24 PM By: Rhae Hammock RN Entered By: Rhae Hammock on 06/13/2022 12:48:13

## 2022-06-21 DIAGNOSIS — N1832 Chronic kidney disease, stage 3b: Secondary | ICD-10-CM | POA: Diagnosis not present

## 2022-06-21 DIAGNOSIS — E785 Hyperlipidemia, unspecified: Secondary | ICD-10-CM | POA: Diagnosis not present

## 2022-06-21 DIAGNOSIS — E1169 Type 2 diabetes mellitus with other specified complication: Secondary | ICD-10-CM | POA: Diagnosis not present

## 2022-06-21 DIAGNOSIS — I129 Hypertensive chronic kidney disease with stage 1 through stage 4 chronic kidney disease, or unspecified chronic kidney disease: Secondary | ICD-10-CM | POA: Diagnosis not present

## 2022-06-27 ENCOUNTER — Encounter (HOSPITAL_BASED_OUTPATIENT_CLINIC_OR_DEPARTMENT_OTHER): Payer: HMO | Admitting: Internal Medicine

## 2022-06-27 DIAGNOSIS — L97828 Non-pressure chronic ulcer of other part of left lower leg with other specified severity: Secondary | ICD-10-CM

## 2022-06-27 DIAGNOSIS — I70248 Atherosclerosis of native arteries of left leg with ulceration of other part of lower left leg: Secondary | ICD-10-CM | POA: Diagnosis not present

## 2022-06-27 DIAGNOSIS — E11622 Type 2 diabetes mellitus with other skin ulcer: Secondary | ICD-10-CM | POA: Diagnosis not present

## 2022-06-28 NOTE — Progress Notes (Signed)
[name], Houston ([id number]) [id number] Page 1 of 7 Visit Report for 06/27/2022 Chief Complaint Document Details Patient Name: Date of Service: [name] [place], [name] Houston 06/27/2022 12:30 PM Medical Record Number: [id number] Patient Account Number: [id number] Date of Birth/Sex: Treating RN: [birth year] (67 y.o. F) Primary Care Provider: Harlan Stains Other Clinician: Referring Provider: Treating Provider/Extender: Jake Church in Treatment: 21 Information Obtained from: Patient Chief Complaint 10/20/2021; Left posterior leg wound 01/31/2022; left posterior leg wound Electronic Signature(s) Signed: 06/27/2022 3:46:53 PM By: Kalman Shan DO Entered By: Kalman Shan on 06/27/2022 13:11:[place] -------------------------------------------------------------------------------- HPI Details Patient Name: Date of Service: [name] [place] RO N J. 06/27/2022 12:30 PM Medical Record Number: [id number] Patient Account Number: [id number] Date of Birth/Sex: Treating RN: [birth year] (67 y.o. F) Primary Care Provider: Harlan Stains Other Clinician: Referring Provider: Treating Provider/Extender: Jake Church in Treatment: 21 History of Present Illness HPI Description: Admission 06/26/2021 [name] Houston is a 67 year old female with a past medical history of uncontrolled type 2 diabetes on oral agents with last hemoglobin A1c of 9, peripheral arterial disease that presents to the clinic for a 3-week history of worsening wound to the posterior aspect of her left leg. She is not sure how it started. She has been using normal saline to the wound bed. She had arterial segmental pressures that showed a left ABI of 0.52 she is scheduled to see vein and vascular on 7/5. She is currently on doxycycline prescribed by her primary care doctor. She currently denies systemic signs of infection. 7/7; patient presents for follow-up. She saw  Dr. Donzetta Matters yesterday, vein and vascular to discuss her arterial status. He is scheduling her for an arteriogram for possible intervention. She has had no issues with the Medihoney. 7/24; patient presents for follow-up. She continues to use Medihoney. She is scheduled to have a stress test on 7/28 in order to be cleared for vascular surgery. Plan is for left common femoral endarterectomy and left common femoral to below the knee popliteal artery bypass. Readmission 01/31/2022 Patient had a left iliofemoral endarterectomy with vein patch angioplasty and common femoral to above-the-knee popliteal bypass with PTFE by Dr. Donzetta Matters on 12/06/2021. She had follow-up with vein and vascular on 01/18/2022 and it was noted that her left foot is well-perfused with brisk DP and PT Doppler signals. She has follow-up again with vein and vascular at the end of this month for bypass duplex and ABIs. She is currently been using Medihoney to the wound bed. She reports improvement to her chronic pain. She currently denies signs of infection. 10/10; patient presents for follow-up. She has been she has been using Dakin's wet-to-dry dressings however she was having pain on dressing changes. We switched to [facility] in the week. She is done better with this. She has no issues or complaints today. 10/17; patient presents for follow-up. She has been using Santyl and Hydrofera Blue to the wound bed. There is been improvement in wound healing. She has no issues or complaints today. 10/31; patient presents for follow-up. She has been using Santyl and Hydrofera Blue to the wound bed. She has no issues or complaints today. She saw vein and vascular on 10/25 they repeated a TBI on the left which was 0.66. She had a bypass graft duplex that showed biphasic waveforms of throughout the left lower extremity 11/10; patient presents for follow-up. She has recently run out of the [place] and has continued to use Hydrofera Blue to the wound bed.  She  has no issues or complaints today. 11/16; patient presents for follow-up. She states she still had a little bit of Santyl left in the tube that she was able to use to the wound bed this past week. She is continued with Hydrofera Blue. She has been using the Tubigrip. She has no issues or complaints today. 11/30; patient presents for follow-up. She states she continues to have a little bit of Santyl left and has been using this with Hydrofera Blue. She knows to use Medihoney once this runs out. She has been using Tubigrip. She has no issues or complaints today. 12/5; patient presents for follow-up. She is approved for Apligraf and this was available for placement today. She has no issues or complaints today. She has home health. 12/15; patient presents for follow-up. Apligraf was placed in standard fashion at last clinic visit under kerlix/coban. She did well with this. The wound is [name] Houston, [name] Houston ([id number]) [id number] Page 2 of 7 measuring smaller. 12/28; patient presents for follow-up. Apligraf was placed at last clinic visit. She has home health to change the wrap. Apligraf was kept in place for the past 2 weeks. She has tolerated this well. The wound is measuring smaller. 1/9; patient presents for follow-up. Apligraf was placed at last clinic visit. Home health is changing the wrap once weekly. She has no issues or complaints today. Due to the new year insurance was run again for apligraf. Due to high out-of-pocket cost patient declined proceeding with this. 1/18; patient presents for follow-up. We have been using collagen under Kerlix/Coban. She has home health and they have changed the dressing. The collagen is stuck to her skin. She has no issues or complaints today. 06/13/2022; patient has been wearing her for compression stockings daily. She called because she was concerned about a small blister to her left lower extremity. She has no open wounds. Her  original wound still remains healed. She denies signs of infection. 2/27; patient presents for follow-up. The small blister has opened up and epithelialized. She has no issues or complaints today. Electronic Signature(s) Signed: 06/27/2022 3:46:53 PM By: Kalman Shan DO Entered By: Kalman Shan on 06/27/2022 13:11:44 -------------------------------------------------------------------------------- Physical Exam Details Patient Name: Date of Service: [name] [place], [name] Houston RO N J. 06/27/2022 12:30 PM Medical Record Number: [id number] Patient Account Number: [id number] Date of Birth/Sex: Treating RN: [name] [place], [name] Houston (67 y.o. F) Primary Care Provider: Harlan Stains Other Clinician: Referring Provider: Treating Provider/Extender: [name] Weeks in Treatment: 21 Constitutional respirations regular, non-labored and within target range for patient.. Cardiovascular 2+ dorsalis pedis/posterior tibialis pulses. Psychiatric pleasant and cooperative. Notes Left lower extremity: Epithelization to a previous blister site on the posterior aspect. No signs of surrounding infection. Electronic Signature(s) Signed: 06/27/2022 3:46:53 PM By: Kalman Shan DO Entered By: Kalman Shan on 06/27/2022 13:12:39 -------------------------------------------------------------------------------- Physician Orders Details Patient Name: Date of Service: [name] [place] RO N J. 06/27/2022 12:30 PM Medical Record Number: [id number] Patient Account Number: [id number] Date of Birth/Sex: Treating RN: [birth year], [name] Houston (67 y.o. [name], [name] Primary Care Provider: Harlan Stains Other Clinician: Referring Provider: Treating Provider/Extender: Jake Church in Treatment: 21 Verbal / Phone Orders: No Diagnosis Coding Discharge From [facility] Services Discharge from [place] Edema Control - Lymphedema / SCD / Other Elevate legs to the level of the heart or above for 30  minutes daily and/or when sitting for 3-4 times a day throughout the day. Avoid standing for long periods of time. Patient to wear own compression stockings every day. - ***  Left leg ONLY*** Electronic Signature(s) Signed: 06/27/2022 3:46:53 PM By: Kalman Shan DO Entered By: Kalman Shan on 06/27/2022 13:12:47 [name] ([id number]) [id number] Page 3 of 7 -------------------------------------------------------------------------------- Problem List Details Patient Name: Date of Service: [name] Houston, [name] Houston 06/27/2022 12:30 PM Medical Record Number: [id number] Patient Account Number: [id number] Date of Birth/Sex: Treating RN: [birth year] (67 y.o. F) Primary Care Provider: Harlan Stains Other Clinician: Referring Provider: Treating Provider/Extender: Jake Church in Treatment: 21 Active Problems ICD-10 Encounter Code Description Active Date MDM Diagnosis I70.248 Atherosclerosis of native arteries of left leg with ulceration of other part of 01/31/2022 No Yes lower leg L97.828 Non-pressure chronic ulcer of other part of left lower leg with other specified 01/31/2022 No Yes severity E11.622 Type 2 diabetes mellitus with other skin ulcer 01/31/2022 No Yes Inactive Problems Resolved Problems Electronic Signature(s) Signed: 06/27/2022 3:46:53 PM By: Kalman Shan DO Entered By: Kalman Shan on 06/27/2022 13:10:57 -------------------------------------------------------------------------------- Progress Note Details Patient Name: Date of Service: [name] [place] RO N J. 06/27/2022 12:30 PM Medical Record Number: [id number] Patient Account Number: [id number] Date of Birth/Sex: Treating RN: February [place], [name] Houston (67 y.o. F) Primary Care Provider: Harlan Stains Other Clinician: Referring Provider: Treating Provider/Extender: Jake Church in Treatment: 21 Subjective Chief Complaint Information obtained  from Patient 10/20/2021; Left posterior leg wound 01/31/2022; left posterior leg wound History of Present Illness (HPI) Admission 06/26/2021 Ms. Etoy Lattin is a 67 year old female with a past medical history of uncontrolled type 2 diabetes on oral agents with last hemoglobin A1c of 9, peripheral arterial disease that presents to the clinic for a 3-week history of worsening wound to the posterior aspect of her left leg. She is not sure how it started. She has been using normal saline to the wound bed. She had arterial segmental pressures that showed a left ABI of 0.52 she is scheduled to see vein and vascular on 7/5. She is currently on doxycycline prescribed by her primary care doctor. She currently denies systemic signs of infection. 7/7; patient presents for follow-up. She saw Dr. Donzetta Matters yesterday, vein and vascular to discuss her arterial status. He is scheduling her for an arteriogram for possible intervention. She has had no issues with the Medihoney. 7/24; patient presents for follow-up. She continues to use Medihoney. She is scheduled to have a stress test on 7/28 in order to be cleared for vascular surgery. Plan is for left common femoral endarterectomy and left common femoral to below the knee popliteal artery bypass. Readmission 01/31/2022 Patient had a left iliofemoral endarterectomy with vein patch angioplasty and common femoral to above-the-knee popliteal bypass with PTFE by Dr. Donzetta Matters on 12/06/2021. She had follow-up with vein and vascular on 01/18/2022 and it was noted that her left foot is well-perfused with brisk DP and PT Doppler signals. She has follow-up again with vein and vascular at the end of this month for bypass duplex and ABIs. She is currently been using Medihoney to the wound bed. She reports improvement to her chronic pain. She currently denies signs of infection. [name] Houston, [name] Houston ([id number]) [id number] Page 4 of 7 10/10; patient presents for  follow-up. She has been she has been using Dakin's wet-to-dry dressings however she was having pain on dressing changes. We switched to [facility] in the week. She is done better with this. She has no issues or complaints today. 10/17; patient presents for follow-up. She has been using Santyl and Hydrofera Blue to the wound bed. There is been improvement in wound healing.  She has no issues or complaints today. 10/31; patient presents for follow-up. She has been using Santyl and Hydrofera Blue to the wound bed. She has no issues or complaints today. She saw vein and vascular on 10/25 they repeated a TBI on the left which was 0.66. She had a bypass graft duplex that showed biphasic waveforms of throughout the left lower extremity 11/10; patient presents for follow-up. She has recently run out of the [place] and has continued to use Hydrofera Blue to the wound bed. She has no issues or complaints today. 11/16; patient presents for follow-up. She states she still had a little bit of Santyl left in the tube that she was able to use to the wound bed this past week. She is continued with Hydrofera Blue. She has been using the Tubigrip. She has no issues or complaints today. 11/30; patient presents for follow-up. She states she continues to have a little bit of Santyl left and has been using this with Hydrofera Blue. She knows to use Medihoney once this runs out. She has been using Tubigrip. She has no issues or complaints today. 12/5; patient presents for follow-up. She is approved for Apligraf and this was available for placement today. She has no issues or complaints today. She has home health. 12/15; patient presents for follow-up. Apligraf was placed in standard fashion at last clinic visit under kerlix/coban. She did well with this. The wound is measuring smaller. 12/28; patient presents for follow-up. Apligraf was placed at last clinic visit. She has home health to change the wrap. Apligraf was  kept in place for the past 2 weeks. She has tolerated this well. The wound is measuring smaller. 1/9; patient presents for follow-up. Apligraf was placed at last clinic visit. Home health is changing the wrap once weekly. She has no issues or complaints today. Due to the new year insurance was run again for apligraf. Due to high out-of-pocket cost patient declined proceeding with this. 1/18; patient presents for follow-up. We have been using collagen under Kerlix/Coban. She has home health and they have changed the dressing. The collagen is stuck to her skin. She has no issues or complaints today. 06/13/2022; patient has been wearing her for compression stockings daily. She called because she was concerned about a small blister to her left lower extremity. She has no open wounds. Her original wound still remains healed. She denies signs of infection. 2/27; patient presents for follow-up. The small blister has opened up and epithelialized. She has no issues or complaints today. Patient History Information obtained from Patient, Caregiver. Family History Cancer - Father, Diabetes - Mother,Maternal Grandparents,Father, Hypertension - Mother, No family history of Heart Disease, Hereditary Spherocytosis, Kidney Disease, Lung Disease, Seizures, Stroke, Thyroid Problems, Tuberculosis. Social History Former smoker, Marital Status - Married, Alcohol Use - Rarely, Drug Use - No History, Caffeine Use - Daily. Medical History Eyes Denies history of Cataracts, Glaucoma, Optic Neuritis Cardiovascular Patient has history of Hypertension, Peripheral Arterial Disease, Peripheral Venous Disease Denies history of Angina, Arrhythmia, Coronary Artery Disease, Deep Vein Thrombosis, Hypotension, Myocardial Infarction, Phlebitis, Vasculitis Gastrointestinal Denies history of Cirrhosis , Colitis, Crohnoos, Hepatitis A, Hepatitis B, Hepatitis C Endocrine Patient has history of Type II Diabetes - A1C-9.1 Denies  history of Type I Diabetes Immunological Denies history of Lupus Erythematosus, Raynaudoos, Scleroderma Musculoskeletal Patient has history of Osteoarthritis, Osteomyelitis - Hx ankle Denies history of Gout, Rheumatoid Arthritis Neurologic Denies history of Neuropathy, Quadriplegia, Paraplegia, Seizure Disorder Hospitalization/Surgery History - left knee. Medical A Surgical  History Notes nd Genitourinary CKD-3 Objective Constitutional respirations regular, non-labored and within target range for patient.. Vitals Time Taken: 12:44 PM, Height: 64 in, Weight: 197 lbs, BMI: 33.8, Temperature: 98 F, Pulse: 82 bpm, Respiratory Rate: 17 breaths/min, Blood Pressure: [name] Houston, [name] Houston ([id number]) [id number] Page 5 of 7 143/83 mmHg, Capillary Blood Glucose: 108 mg/dl. Cardiovascular 2+ dorsalis pedis/posterior tibialis pulses. Psychiatric pleasant and cooperative. General Notes: Left lower extremity: Epithelization to a previous blister site on the posterior aspect. No signs of surrounding infection. Assessment Active Problems ICD-10 Atherosclerosis of native arteries of left leg with ulceration of other part of lower leg Non-pressure chronic ulcer of other part of left lower leg with other specified severity Type 2 diabetes mellitus with other skin ulcer Patient's previous blister site has epithelialized. She may follow-up as needed. Plan Discharge From [facility] Services: Discharge from [place] Edema Control - Lymphedema / SCD / Other: Elevate legs to the level of the heart or above for 30 minutes daily and/or when sitting for 3-4 times a day throughout the day. Avoid standing for long periods of time. Patient to wear own compression stockings every day. - ***Left leg ONLY*** 1. Compression stockings daily 2. Follow-up as needed 3. Discharge from clinic due to closed wound Electronic Signature(s) Signed: 06/27/2022 3:46:53 PM By: Kalman Shan  DO Entered By: Kalman Shan on 06/27/2022 13:13:35 -------------------------------------------------------------------------------- HxROS Details Patient Name: Date of Service: [name] [place] RO N J. 06/27/2022 12:30 PM Medical Record Number: [id number] Patient Account Number: [id number] Date of Birth/Sex: Treating RN: [birth year] ([place] y.o. F) Primary Care Provider: Harlan Stains Other Clinician: Referring Provider: Treating Provider/Extender: Jake Church in Treatment: 21 Information Obtained From Patient Caregiver Eyes Medical History: Negative for: Cataracts; Glaucoma; Optic Neuritis Cardiovascular Medical History: Positive for: Hypertension; Peripheral Arterial Disease; Peripheral Venous Disease Negative for: Angina; Arrhythmia; Coronary Artery Disease; Deep Vein Thrombosis; Hypotension; Myocardial Infarction; Phlebitis; Vasculitis Gastrointestinal Medical History: [name] Houston, [name] Houston ([id number]) [phone].pdf Page 6 of 7 Negative for: Cirrhosis ; Colitis; Crohns; Hepatitis A; Hepatitis B; Hepatitis C Endocrine Medical History: Positive for: Type II Diabetes - A1C-9.1 Negative for: Type I Diabetes Time with diabetes: 12 years Treated with: Oral agents Blood sugar tested every day: No Genitourinary Medical History: Past Medical History Notes: CKD-3 Immunological Medical History: Negative for: Lupus Erythematosus; Raynauds; Scleroderma Musculoskeletal Medical History: Positive for: Osteoarthritis; Osteomyelitis - Hx ankle Negative for: Gout; Rheumatoid Arthritis Neurologic Medical History: Negative for: Neuropathy; Quadriplegia; Paraplegia; Seizure Disorder Immunizations Pneumococcal Vaccine: Received Pneumococcal Vaccination: No Implantable Devices None Hospitalization / Surgery History Type of Hospitalization/Surgery left knee Family and Social History Cancer: Yes - Father; Diabetes: Yes - Mother,Maternal  Grandparents,Father; Heart Disease: No; Hereditary Spherocytosis: No; Hypertension: Yes - Mother; Kidney Disease: No; Lung Disease: No; Seizures: No; Stroke: No; Thyroid Problems: No; Tuberculosis: No; Former smoker; Marital Status - Married; Alcohol Use: Rarely; Drug Use: No History; Caffeine Use: Daily; Financial Concerns: No; Food, Clothing or Shelter Needs: No; Support System Lacking: No; Transportation Concerns: No Electronic Signature(s) Signed: 06/27/2022 3:46:53 PM By: Kalman Shan DO Entered By: Kalman Shan on 06/27/2022 13:11:50 -------------------------------------------------------------------------------- SuperBill Details Patient Name: Date of Service: [name] Kays. 06/27/2022 Medical Record Number: [id number] Patient Account Number: [id number] Date of Birth/Sex: Treating RN: [birth year] (67 y.o. [name], [name] Primary Care Provider: Harlan Stains Other Clinician: Referring Provider: Treating Provider/Extender: [name] Weeks in Treatment: 21 Diagnosis Coding ICD-10 Codes Code Description [phone] Atherosclerosis of native arteries of left leg with ulceration of  other part of lower leg L97.828 Non-pressure chronic ulcer of other part of left lower leg with other specified severity E11.622 Type 2 diabetes mellitus with other skin ulcer [name] [place], [place] ([id number]) [id number] Page 7 of 7 Facility Procedures : CPT4 Code: [id number] Description: 99213 - WOUND CARE VISIT-LEV 3 EST PT Modifier: Quantity: 1 Physician Procedures : CPT4 Code Description Modifier [id number] - WC PHYS LEVEL 3 - EST PT ICD-10 Diagnosis Description I70.248 Atherosclerosis of native arteries of left leg with ulceration of other part of lower leg L97.828 Non-pressure chronic ulcer of other part of  left lower leg with other specified severity E11.622 Type 2 diabetes mellitus with other skin ulcer Quantity: 1 Electronic  Signature(s) Signed: 06/27/2022 3:46:53 PM By: Kalman Shan DO Entered By: Kalman Shan on 06/27/2022 13:13:49

## 2022-06-28 NOTE — Progress Notes (Signed)
[name] Houston, [name] Houston ([id number]) [id number] Page 1 of 5 Visit Report for 06/27/2022 Arrival Information Details Patient Name: Date of Service: [name] Houston, [name] Houston 06/27/2022 12:30 PM Medical Record Number: [id number] Patient Account Number: [id number] Date of Birth/Sex: Treating RN: July 27, 1955 (67 y.o. [name], [name] Primary Care Lang Zingg: Harlan Stains Other Clinician: Referring Jerome Otter: Treating Alick Lecomte/Extender: Jake Church in Treatment: 21 Visit Information History Since Last Visit Added or deleted any medications: No Patient Arrived: Ambulatory Any new allergies or adverse reactions: No Arrival Time: 12:43 Had a fall or experienced change in No Accompanied By: husband activities of daily living that may affect Transfer Assistance: None risk of falls: Patient Identification Verified: Yes Signs or symptoms of abuse/neglect since last visito No Secondary Verification Process Completed: Yes Hospitalized since last visit: No Patient Requires Transmission-Based Precautions: No Implantable device outside of the clinic excluding No Patient Has Alerts: No cellular tissue based products placed in the center since last visit: Has Dressing in Place as Prescribed: Yes Pain Present Now: No Electronic Signature(s) Signed: 06/27/2022 3:29:19 PM By: Rhae Hammock RN Entered By: Rhae Hammock on 06/27/2022 12:43:37 -------------------------------------------------------------------------------- Clinic Level of Care Assessment Details Patient Name: Date of Service: [name] Houston, [name] Houston 06/27/2022 12:30 PM Medical Record Number: [id number] Patient Account Number: [id number] Date of Birth/Sex: Treating RN: 1955/08/17 (67 y.o. [name], [name] Primary Care Bernetha Anschutz: Harlan Stains Other Clinician: Referring Amal Saiki: Treating Tish Begin/Extender: Jake Church in Treatment: 21 Clinic Level of Care  Assessment Items TOOL 4 Quantity Score X- 1 0 Use when only an EandM is performed on FOLLOW-UP visit ASSESSMENTS - Nursing Assessment / Reassessment X- 1 10 Reassessment of Co-morbidities (includes updates in patient status) X- 1 5 Reassessment of Adherence to Treatment Plan ASSESSMENTS - Wound and Skin A ssessment / Reassessment X - Simple Wound Assessment / Reassessment - one wound 1 5 '[]'$  - 0 Complex Wound Assessment / Reassessment - multiple wounds '[]'$  - 0 Dermatologic / Skin Assessment (not related to wound area) ASSESSMENTS - Focused Assessment '[]'$  - 0 Circumferential Edema Measurements - multi extremities '[]'$  - 0 Nutritional Assessment / Counseling / Intervention '[]'$  - 0 Lower Extremity Assessment (monofilament, tuning fork, pulses) '[]'$  - 0 Peripheral Arterial Disease Assessment (using hand held doppler) ASSESSMENTS - Ostomy and/or Continence Assessment and Care '[]'$  - 0 Incontinence Assessment and Management '[]'$  - 0 Ostomy Care Assessment and Management (repouching, etc.) PROCESS - Coordination of Care X - Simple Patient / Family Education for ongoing care 1 [address], [place] ([id number]) [id number] Page 2 of 5 '[]'$  - 0 Complex (extensive) Patient / Family Education for ongoing care X- 1 10 Staff obtains Programmer, systems, Records, T Results / Process Orders est '[]'$  - 0 Staff telephones HHA, Nursing Homes / Clarify orders / etc '[]'$  - 0 Routine Transfer to another Facility (non-emergent condition) '[]'$  - 0 Routine Hospital Admission (non-emergent condition) '[]'$  - 0 New Admissions / Biomedical engineer / Ordering NPWT Apligraf, etc. , '[]'$  - 0 Emergency Hospital Admission (emergent condition) X- 1 10 Simple Discharge Coordination '[]'$  - 0 Complex (extensive) Discharge Coordination PROCESS - Special Needs '[]'$  - 0 Pediatric / Minor Patient Management '[]'$  - 0 Isolation Patient Management '[]'$  - 0 Hearing / Language / Visual special needs '[]'$  -  0 Assessment of Community assistance (transportation, D/C planning, etc.) '[]'$  - 0 Additional assistance / Altered mentation '[]'$  - 0 Support Surface(s) Assessment (bed, cushion, seat, etc.) INTERVENTIONS - Wound Cleansing / Measurement X - Simple Wound  Cleansing - one wound 1 5 '[]'$  - 0 Complex Wound Cleansing - multiple wounds X- 1 5 Wound Imaging (photographs - any number of wounds) '[]'$  - 0 Wound Tracing (instead of photographs) X- 1 5 Simple Wound Measurement - one wound '[]'$  - 0 Complex Wound Measurement - multiple wounds INTERVENTIONS - Wound Dressings X - Small Wound Dressing one or multiple wounds 1 10 '[]'$  - 0 Medium Wound Dressing one or multiple wounds '[]'$  - 0 Large Wound Dressing one or multiple wounds X- 1 5 Application of Medications - topical '[]'$  - 0 Application of Medications - injection INTERVENTIONS - Miscellaneous '[]'$  - 0 External ear exam '[]'$  - 0 Specimen Collection (cultures, biopsies, blood, body fluids, etc.) '[]'$  - 0 Specimen(s) / Culture(s) sent or taken to Lab for analysis '[]'$  - 0 Patient Transfer (multiple staff / Civil Service fast streamer / Similar devices) '[]'$  - 0 Simple Staple / Suture removal (25 or less) '[]'$  - 0 Complex Staple / Suture removal (26 or more) '[]'$  - 0 Hypo / Hyperglycemic Management (close monitor of Blood Glucose) '[]'$  - 0 Ankle / Brachial Index (ABI) - do not check if billed separately X- 1 5 Vital Signs Has the patient been seen at the hospital within the last three years: Yes Total Score: 90 Level Of Care: New/Established - Level 3 Electronic Signature(s) Signed: 06/27/2022 3:29:19 PM By: Rhae Hammock RN Entered By: Rhae Hammock on 06/27/2022 13:09:48 [name] ([id number] Page 3 of 5 -------------------------------------------------------------------------------- Encounter Discharge Information Details Patient Name: Date of Service: [name] Houston, [name] Houston 06/27/2022 12:30 PM Medical Record  Number: [id number] Patient Account Number: [id number] Date of Birth/Sex: Treating RN: 1955-06-22 (67 y.o. [name], [name] Primary Care Gizell Danser: Harlan Stains Other Clinician: Referring Vincent Ehrler: Treating Kyanne Rials/Extender: Jake Church in Treatment: 21 Encounter Discharge Information Items Discharge Condition: Stable Ambulatory Status: Ambulatory Discharge Destination: Home Transportation: Private Auto Accompanied By: husband Schedule Follow-up Appointment: Yes Clinical Summary of Care: Patient Declined Electronic Signature(s) Signed: 06/27/2022 3:29:19 PM By: Rhae Hammock RN Entered By: Rhae Hammock on 06/27/2022 13:10:22 -------------------------------------------------------------------------------- Lower Extremity Assessment Details Patient Name: Date of Service: [name] Cavalier RO N J. 06/27/2022 12:30 PM Medical Record Number: [id number] Patient Account Number: [id number] Date of Birth/Sex: Treating RN: 1955/10/14 (67 y.o. [name], [name] Primary Care Abdelrahman Nair: Harlan Stains Other Clinician: Referring Chalisa Kobler: Treating Harvest Stanco/Extender: [name] Weeks in Treatment: 21 Edema Assessment Assessed: [Left: Yes] [Right: No] Edema: [Left: N] [Right: o] Calf Left: Right: Point of Measurement: 29 cm From Medial Instep 37.5 cm Ankle Left: Right: Point of Measurement: 7 cm From Medial Instep 22 cm Vascular Assessment Pulses: Dorsalis Pedis Palpable: [Left:Yes] Posterior Tibial Palpable: [Left:Yes] Electronic Signature(s) Signed: 06/27/2022 3:29:19 PM By: Rhae Hammock RN Entered By: Rhae Hammock on 06/27/2022 12:45:35 -------------------------------------------------------------------------------- Multi Wound Chart Details Patient Name: Date of Service: [name] Cavalier RO N J. 06/27/2022 12:30 PM Medical Record Number: [id number] Patient Account Number: [id number] Date of Birth/Sex: Treating  RN: [birth year] (67 y.o. F) Primary Care Vivion Romano: Harlan Stains Other Clinician: [name] ([id number]) [id number] Page 4 of 5 Referring Marsella Suman: Treating Editha Bridgeforth/Extender: Jake Church in Treatment: 21 Vital Signs Height(in): 64 Capillary Blood Glucose(mg/dl): 108 Weight(lbs): 197 Pulse(bpm): 82 Body Mass Index(BMI): 33.8 Blood Pressure(mmHg): 143/83 Temperature(F): 98 Respiratory Rate(breaths/min): 17 [Treatment Notes:Wound Assessments Treatment Notes] Electronic Signature(s) Signed: 06/27/2022 3:46:53 PM By: Kalman Shan DO Entered By: Kalman Shan on 06/27/2022 13:11:01 -------------------------------------------------------------------------------- Multi-Disciplinary Care Plan Details Patient Name: Date of Service: [name] Cavalier  RO N J. 06/27/2022 12:30 PM Medical Record Number: [id number] Patient Account Number: [id number] Date of Birth/Sex: Treating RN: May 14, 1955 (67 y.o. [name], [name] Primary Care Hasel Janish: Harlan Stains Other Clinician: Referring Tosha Belgarde: Treating Gianno Volner/Extender: Jake Church in Treatment: 21 Active Inactive Electronic Signature(s) Signed: 06/27/2022 3:29:19 PM By: Rhae Hammock RN Entered By: Rhae Hammock on 06/27/2022 12:53:17 -------------------------------------------------------------------------------- Pain Assessment Details Patient Name: Date of Service: [name] Cavalier RO N J. 06/27/2022 12:30 PM Medical Record Number: [id number] Patient Account Number: [id number] Date of Birth/Sex: Treating RN: 02-11-1956 (67 y.o. [name], [name] Primary Care Arrington Yohe: Harlan Stains Other Clinician: Referring Jamarion [place]: Treating Denzil Mceachron/Extender: [name] Weeks in Treatment: 21 Active Problems Location of Pain Severity and Description of Pain Patient Has Paino No Site Locations [place], [place] J ([id number])  [id number] Page 5 of 5 Pain Management and Medication Current Pain Management: Electronic Signature(s) Signed: 06/27/2022 3:29:19 PM By: Rhae Hammock RN Entered By: Rhae Hammock on 06/27/2022 12:45:28 -------------------------------------------------------------------------------- Patient/Caregiver Education Details Patient Name: Date of Service: [name] Houston 2/27/2024andnbsp12:30 PM Medical Record Number: [id number] Patient Account Number: [id number] Date of Birth/Gender: Treating RN: 12-20-1955 (67 y.o. [name], [name] Primary Care Physician: Harlan Stains Other Clinician: Referring Physician: Treating Physician/Extender: Jake Church in Treatment: 21 Education Assessment Education Provided To: Patient Education Topics Provided Wound/Skin Impairment: Methods: Explain/Verbal Responses: Reinforcements needed, State content correctly [employer]) Signed: 06/27/2022 3:29:19 PM By: Rhae Hammock RN Entered By: Rhae Hammock on 06/27/2022 12:53:39 -------------------------------------------------------------------------------- Vitals Details Patient Name: Date of Service: [name] Cavalier RO N J. 06/27/2022 12:30 PM Medical Record Number: [id number] Patient Account Number: [id number] Date of Birth/Sex: Treating RN: Sep 05, 1955 (67 y.o. [name], [name] Primary Care Iokepa Geffre: Harlan Stains Other Clinician: Referring Izayah Miner: Treating Altariq Goodall/Extender: [name] Weeks in Treatment: 21 Vital Signs Time Taken: 12:44 Temperature (F): 98 Height (in): 64 Pulse (bpm): 82 Weight (lbs): 197 Respiratory Rate (breaths/min): 17 Body Mass Index (BMI): 33.8 Blood Pressure (mmHg): 143/83 Capillary Blood Glucose (mg/dl): 108 Reference Range: 80 - 120 mg / dl Electronic Signature(s) Signed: 06/27/2022 3:29:19 PM By: Rhae Hammock RN Entered By: Rhae Hammock on  06/27/2022 12:45:22

## 2022-07-10 ENCOUNTER — Other Ambulatory Visit: Payer: Self-pay | Admitting: Family Medicine

## 2022-07-10 DIAGNOSIS — Z1231 Encounter for screening mammogram for malignant neoplasm of breast: Secondary | ICD-10-CM

## 2022-07-28 DIAGNOSIS — U071 COVID-19: Secondary | ICD-10-CM | POA: Diagnosis not present

## 2022-07-28 DIAGNOSIS — I10 Essential (primary) hypertension: Secondary | ICD-10-CM | POA: Diagnosis not present

## 2022-08-25 ENCOUNTER — Ambulatory Visit
Admission: RE | Admit: 2022-08-25 | Discharge: 2022-08-25 | Disposition: A | Discharge status: Home / Self Care | Payer: HMO | Source: Ambulatory Visit | Attending: Family Medicine | Admitting: Family Medicine

## 2022-08-25 DIAGNOSIS — Z1231 Encounter for screening mammogram for malignant neoplasm of breast: Secondary | ICD-10-CM | POA: Diagnosis not present

## 2022-09-07 DIAGNOSIS — K219 Gastro-esophageal reflux disease without esophagitis: Secondary | ICD-10-CM | POA: Diagnosis not present

## 2022-09-07 DIAGNOSIS — E1122 Type 2 diabetes mellitus with diabetic chronic kidney disease: Secondary | ICD-10-CM | POA: Diagnosis not present

## 2022-09-07 DIAGNOSIS — G894 Chronic pain syndrome: Secondary | ICD-10-CM | POA: Diagnosis not present

## 2022-09-07 DIAGNOSIS — I129 Hypertensive chronic kidney disease with stage 1 through stage 4 chronic kidney disease, or unspecified chronic kidney disease: Secondary | ICD-10-CM | POA: Diagnosis not present

## 2022-09-07 DIAGNOSIS — I700 Atherosclerosis of aorta: Secondary | ICD-10-CM | POA: Diagnosis not present

## 2022-09-07 DIAGNOSIS — N1832 Chronic kidney disease, stage 3b: Secondary | ICD-10-CM | POA: Diagnosis not present

## 2022-09-07 DIAGNOSIS — E785 Hyperlipidemia, unspecified: Secondary | ICD-10-CM | POA: Diagnosis not present

## 2022-09-07 DIAGNOSIS — E1151 Type 2 diabetes mellitus with diabetic peripheral angiopathy without gangrene: Secondary | ICD-10-CM | POA: Diagnosis not present

## 2022-09-07 DIAGNOSIS — E1169 Type 2 diabetes mellitus with other specified complication: Secondary | ICD-10-CM | POA: Diagnosis not present

## 2022-09-21 NOTE — Telephone Encounter (Signed)
Encounter opened in error

## 2022-09-26 DIAGNOSIS — E785 Hyperlipidemia, unspecified: Secondary | ICD-10-CM | POA: Diagnosis not present

## 2022-09-26 DIAGNOSIS — I129 Hypertensive chronic kidney disease with stage 1 through stage 4 chronic kidney disease, or unspecified chronic kidney disease: Secondary | ICD-10-CM | POA: Diagnosis not present

## 2022-09-26 DIAGNOSIS — E1169 Type 2 diabetes mellitus with other specified complication: Secondary | ICD-10-CM | POA: Diagnosis not present

## 2022-09-26 DIAGNOSIS — N1832 Chronic kidney disease, stage 3b: Secondary | ICD-10-CM | POA: Diagnosis not present

## 2022-12-15 DIAGNOSIS — Z6836 Body mass index (BMI) 36.0-36.9, adult: Secondary | ICD-10-CM | POA: Diagnosis not present

## 2022-12-15 DIAGNOSIS — E1122 Type 2 diabetes mellitus with diabetic chronic kidney disease: Secondary | ICD-10-CM | POA: Diagnosis not present

## 2022-12-15 DIAGNOSIS — G894 Chronic pain syndrome: Secondary | ICD-10-CM | POA: Diagnosis not present

## 2022-12-15 DIAGNOSIS — E785 Hyperlipidemia, unspecified: Secondary | ICD-10-CM | POA: Diagnosis not present

## 2022-12-15 DIAGNOSIS — E1169 Type 2 diabetes mellitus with other specified complication: Secondary | ICD-10-CM | POA: Diagnosis not present

## 2022-12-15 DIAGNOSIS — E1151 Type 2 diabetes mellitus with diabetic peripheral angiopathy without gangrene: Secondary | ICD-10-CM | POA: Diagnosis not present

## 2022-12-15 DIAGNOSIS — I129 Hypertensive chronic kidney disease with stage 1 through stage 4 chronic kidney disease, or unspecified chronic kidney disease: Secondary | ICD-10-CM | POA: Diagnosis not present

## 2022-12-15 DIAGNOSIS — N1832 Chronic kidney disease, stage 3b: Secondary | ICD-10-CM | POA: Diagnosis not present

## 2022-12-15 DIAGNOSIS — I700 Atherosclerosis of aorta: Secondary | ICD-10-CM | POA: Diagnosis not present

## 2023-01-05 DIAGNOSIS — E1122 Type 2 diabetes mellitus with diabetic chronic kidney disease: Secondary | ICD-10-CM | POA: Diagnosis not present

## 2023-01-05 DIAGNOSIS — N1832 Chronic kidney disease, stage 3b: Secondary | ICD-10-CM | POA: Diagnosis not present

## 2023-01-05 DIAGNOSIS — I129 Hypertensive chronic kidney disease with stage 1 through stage 4 chronic kidney disease, or unspecified chronic kidney disease: Secondary | ICD-10-CM | POA: Diagnosis not present

## 2023-01-05 DIAGNOSIS — I739 Peripheral vascular disease, unspecified: Secondary | ICD-10-CM | POA: Diagnosis not present

## 2023-01-05 DIAGNOSIS — R809 Proteinuria, unspecified: Secondary | ICD-10-CM | POA: Diagnosis not present

## 2023-03-02 DIAGNOSIS — E1136 Type 2 diabetes mellitus with diabetic cataract: Secondary | ICD-10-CM | POA: Diagnosis not present

## 2023-03-02 DIAGNOSIS — E876 Hypokalemia: Secondary | ICD-10-CM | POA: Diagnosis not present

## 2023-03-02 DIAGNOSIS — E1122 Type 2 diabetes mellitus with diabetic chronic kidney disease: Secondary | ICD-10-CM | POA: Diagnosis not present

## 2023-03-02 DIAGNOSIS — E1151 Type 2 diabetes mellitus with diabetic peripheral angiopathy without gangrene: Secondary | ICD-10-CM | POA: Diagnosis not present

## 2023-03-02 DIAGNOSIS — E538 Deficiency of other specified B group vitamins: Secondary | ICD-10-CM | POA: Diagnosis not present

## 2023-03-02 DIAGNOSIS — G8929 Other chronic pain: Secondary | ICD-10-CM | POA: Diagnosis not present

## 2023-03-02 DIAGNOSIS — N183 Chronic kidney disease, stage 3 unspecified: Secondary | ICD-10-CM | POA: Diagnosis not present

## 2023-03-02 DIAGNOSIS — E1169 Type 2 diabetes mellitus with other specified complication: Secondary | ICD-10-CM | POA: Diagnosis not present

## 2023-03-02 DIAGNOSIS — H259 Unspecified age-related cataract: Secondary | ICD-10-CM | POA: Diagnosis not present

## 2023-03-02 DIAGNOSIS — E785 Hyperlipidemia, unspecified: Secondary | ICD-10-CM | POA: Diagnosis not present

## 2023-03-02 DIAGNOSIS — E559 Vitamin D deficiency, unspecified: Secondary | ICD-10-CM | POA: Diagnosis not present

## 2023-03-14 ENCOUNTER — Encounter: Payer: Self-pay | Admitting: Vascular Surgery

## 2023-03-14 ENCOUNTER — Ambulatory Visit (INDEPENDENT_AMBULATORY_CARE_PROVIDER_SITE_OTHER): Payer: PPO | Admitting: Physician Assistant

## 2023-03-14 ENCOUNTER — Ambulatory Visit (INDEPENDENT_AMBULATORY_CARE_PROVIDER_SITE_OTHER)
Admission: RE | Admit: 2023-03-14 | Discharge: 2023-03-14 | Discharge status: Home / Self Care | Payer: PPO | Source: Ambulatory Visit | Attending: Vascular Surgery

## 2023-03-14 ENCOUNTER — Ambulatory Visit (HOSPITAL_COMMUNITY)
Admission: RE | Admit: 2023-03-14 | Discharge: 2023-03-14 | Disposition: A | Discharge status: Home / Self Care | Payer: PPO | Source: Ambulatory Visit | Attending: Vascular Surgery | Admitting: Vascular Surgery

## 2023-03-14 VITALS — BP 146/82 | HR 64 | Temp 97.3°F | Ht 64.0 in | Wt 206.4 lb

## 2023-03-14 DIAGNOSIS — I70262 Atherosclerosis of native arteries of extremities with gangrene, left leg: Secondary | ICD-10-CM | POA: Insufficient documentation

## 2023-03-14 DIAGNOSIS — I739 Peripheral vascular disease, unspecified: Secondary | ICD-10-CM | POA: Insufficient documentation

## 2023-03-14 LAB — VAS US ABI WITH/WO TBI
Left ABI: 1.22
Right ABI: 0.69

## 2023-03-14 NOTE — Progress Notes (Signed)
Office Note   History of Present Illness   [name] Houston is a 67 y.o. (1955/08/17) female who presents for surveillance of PAD.  She has a history of left iliofemoral endarterectomy and common femoral to above-knee popliteal artery bypass with PTFE by Dr. Randie Heinz on 12/06/2021.  This was done to improve blood flow for a left calf ulceration.  The patient's left lower extremity wound is now healed.  She returns today for follow-up.  She denies any rest pain, claudication, or tissue loss. She is slowly getting back to work and able to work about 3 to 4 hours each shift.  She still has issues with left lower extremity swelling after being on her feet for long periods of time.  She wears her compression stockings throughout the day and elevates her legs when at home.    Current Outpatient Medications  Medication Sig Dispense Refill   acetaminophen (TYLENOL) 500 MG tablet Take 1,000 mg by mouth every 6 (six) hours as needed for moderate pain.     amLODipine (NORVASC) 10 MG tablet Take 10 mg by mouth daily.     aspirin 81 MG tablet Take 81 mg by mouth daily.     atorvastatin (LIPITOR) 40 MG tablet Take 40 mg by mouth daily.     chlorthalidone (HYGROTON) 25 MG tablet Take 25 mg by mouth daily.     cholecalciferol (VITAMIN D3) 25 MCG (1000 UNIT) tablet Take 1,000 Units by mouth daily.     Cyanocobalamin (B-12-SL) 1000 MCG SUBL Place 1,000 mcg under the tongue daily.     dapagliflozin propanediol (FARXIGA) 10 MG TABS tablet Take 10 mg by mouth daily.     famotidine (PEPCID) 40 MG tablet Take 40 mg by mouth See admin instructions. Take 40 mg daily, may take a second 40 mg dose as needed for heartburn     glipiZIDE (GLUCOTROL) 5 MG tablet Take 5 mg by mouth 2 (two) times daily.     Magnesium 250 MG TABS Take 250 mg by mouth 2 (two) times daily. (Patient not taking: Reported on 03/14/2023)     oxyCODONE-acetaminophen (PERCOCET/ROXICET) 5-325 MG tablet Take 1 tablet by mouth every 6 (six) hours  as needed for severe pain. 10 tablet 0   pioglitazone (ACTOS) 15 MG tablet Take 15 mg by mouth daily.     potassium chloride SA (KLOR-CON M) 20 MEQ tablet Take 20 mEq by mouth daily.     Semaglutide,0.25 or 0.5MG /DOS, (OZEMPIC, 0.25 OR 0.5 MG/DOSE,) 2 MG/3ML SOPN Take 0.5 mg by mouth every Thursday.     No current facility-administered medications for this visit.    REVIEW OF SYSTEMS (negative unless checked):   Cardiac:  []  Chest pain or chest pressure? []  Shortness of breath upon activity? []  Shortness of breath when lying flat? []  Irregular heart rhythm?  Vascular:  []  Pain in calf, thigh, or hip brought on by walking? []  Pain in feet at night that wakes you up from your sleep? []  Blood clot in your veins? [x]  Leg swelling?  Pulmonary:  []  Oxygen at home? []  Productive cough? []  Wheezing?  Neurologic:  []  Sudden weakness in arms or legs? []  Sudden numbness in arms or legs? []  Sudden onset of difficult speaking or slurred speech? []  Temporary loss of vision in one eye? []  Problems with dizziness?  Gastrointestinal:  []  Blood in stool? []  Vomited blood?  Genitourinary:  []  Burning when urinating? []  Blood in urine?  Psychiatric:  []  Major depression  Hematologic:  []  Bleeding problems? []  Problems with blood clotting?  Dermatologic:  []  Rashes or ulcers?  Constitutional:  []  Fever or chills?  Ear/Nose/Throat:  []  Change in hearing? []  Nose bleeds? []  Sore throat?  Musculoskeletal:  []  Back pain? []  Joint pain? []  Muscle pain?   Physical Examination   Vitals:   03/14/23 1240 03/14/23 1243  BP: (!) 153/74 (!) 146/82  Pulse: 64   Temp: (!) 97.3 F (36.3 C)   SpO2: 96%   Weight: 206 lb 6.4 oz (93.6 kg)   Height: 5\' 4"  (1.626 m)    Body mass index is 35.43 kg/m.  General:  WDWN in NAD; vital signs documented above Gait: Not observed HENT: WNL, normocephalic Pulmonary: normal non-labored breathing , without rales, rhonchi,   wheezing Cardiac: regular Abdomen: soft, NT, no masses Skin: without rashes Vascular Exam/Pulses: Monophasic right DP/PT Doppler signals.  Palpable left pedal pulses Extremities: without ischemic changes, without gangrene , without cellulitis; without open wounds;  Musculoskeletal: no muscle wasting or atrophy  Neurologic: A&O X 3;  No focal weakness or paresthesias are detected Psychiatric:  The pt has Normal affect.  Non-Invasive Vascular Imaging ABI (03/14/2023) R:  ABI: 0.69 (0.56),  PT: mono DP: mono TBI:  0.52 L:  ABI: 1.22 (0.93),  PT: bi DP: bi TBI: 0.48   LLE Bypass Duplex (03/14/2023) Left Graft #1: CFA to AK pop  +--------------------+--------+--------+----------+---------+                     PSV cm/sStenosisWaveform  Comments   +--------------------+--------+--------+----------+---------+  Inflow             165             monophasicturbulent  +--------------------+--------+--------+----------+---------+  Proximal Anastomosis113             triphasic            +--------------------+--------+--------+----------+---------+  Proximal Graft      65              triphasic            +--------------------+--------+--------+----------+---------+  Mid Graft           58              triphasic            +--------------------+--------+--------+----------+---------+  Distal Graft        69              triphasic            +--------------------+--------+--------+----------+---------+  Distal Anastomosis  104             triphasic            +--------------------+--------+--------+----------+---------+  Outflow            104             biphasic             +--------------------+--------+--------+----------+---------+     Medical Decision Making   [name] Houston is a 67 y.o. female who presents for surveillance of PAD  Based on the patient's vascular studies and examination, her ABIs are essentially  unchanged bilaterally.  Her right ABI 0.69 and left ABI is 1.22 Left lower extremity bypass duplex demonstrates a patent duplex without stenosis or sluggish velocities On exam she has palpable left pedal pulses.  She has intact right DP/PT Doppler signals.  She denies any rest pain, tissue loss, or claudication I recommended  she continue to elevate her legs as needed for leg swelling.  She is also wearing her compression stockings daily. The patient will continue to take her aspirin and statin and follow up with our office in 1 year with ABIs and LLE BPG duplex   Loel Dubonnet PA-C [facility] Office: [phone]  Clinic MD: Randie Heinz

## 2023-03-23 ENCOUNTER — Other Ambulatory Visit: Payer: Self-pay

## 2023-03-23 DIAGNOSIS — I739 Peripheral vascular disease, unspecified: Secondary | ICD-10-CM

## 2023-04-06 DIAGNOSIS — N1832 Chronic kidney disease, stage 3b: Secondary | ICD-10-CM | POA: Diagnosis not present

## 2023-04-12 DIAGNOSIS — I739 Peripheral vascular disease, unspecified: Secondary | ICD-10-CM | POA: Diagnosis not present

## 2023-04-12 DIAGNOSIS — I129 Hypertensive chronic kidney disease with stage 1 through stage 4 chronic kidney disease, or unspecified chronic kidney disease: Secondary | ICD-10-CM | POA: Diagnosis not present

## 2023-04-12 DIAGNOSIS — R809 Proteinuria, unspecified: Secondary | ICD-10-CM | POA: Diagnosis not present

## 2023-04-12 DIAGNOSIS — N1832 Chronic kidney disease, stage 3b: Secondary | ICD-10-CM | POA: Diagnosis not present

## 2023-04-12 DIAGNOSIS — E1122 Type 2 diabetes mellitus with diabetic chronic kidney disease: Secondary | ICD-10-CM | POA: Diagnosis not present

## 2023-04-12 DIAGNOSIS — E1129 Type 2 diabetes mellitus with other diabetic kidney complication: Secondary | ICD-10-CM | POA: Diagnosis not present

## 2023-07-13 ENCOUNTER — Other Ambulatory Visit: Payer: Self-pay | Admitting: Family Medicine

## 2023-07-13 DIAGNOSIS — Z1231 Encounter for screening mammogram for malignant neoplasm of breast: Secondary | ICD-10-CM

## 2023-07-17 IMAGING — MG MM DIGITAL SCREENING BILAT W/ TOMO AND CAD
8 series · 8 of 24 positions shown · non-contrast
Comparison: Previous exam(s).

CLINICAL DATA: Screening.

EXAM:
DIGITAL SCREENING BILATERAL MAMMOGRAM WITH TOMOSYNTHESIS AND CAD
TECHNIQUE: Bilateral screening digital craniocaudal and mediolateral oblique
mammograms were obtained. Bilateral screening digital breast
tomosynthesis was performed. The images were evaluated with
computer-aided detection.

[R MLO synth-2D]
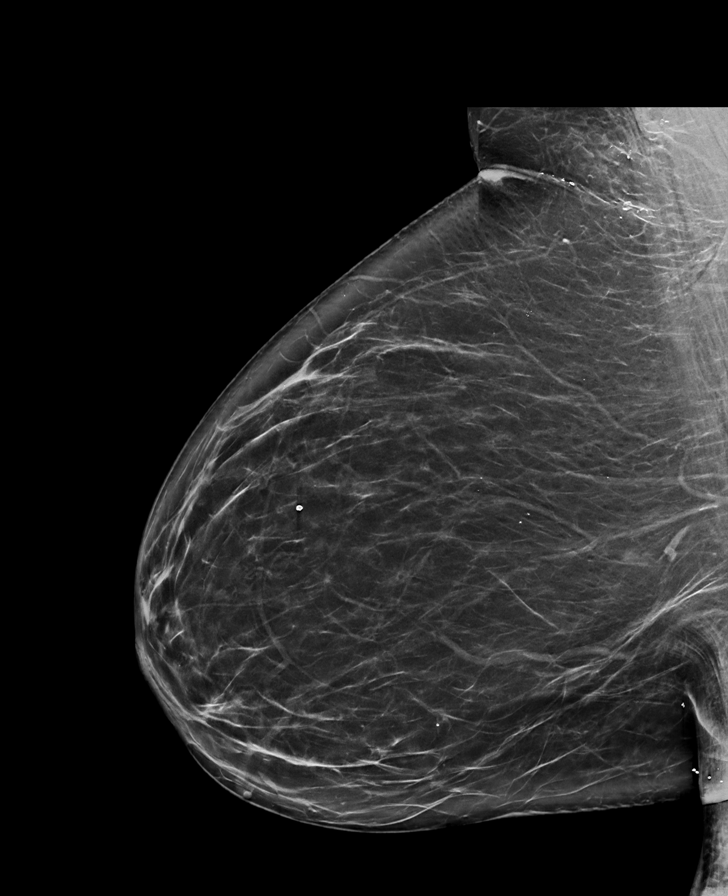

[L CC synth-2D]
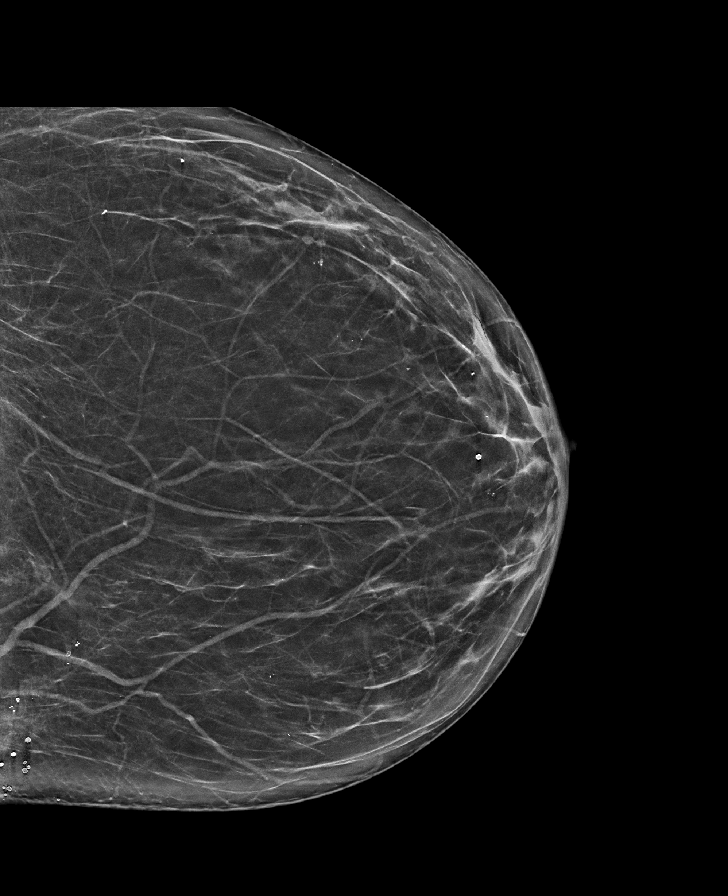

[L MLO synth-2D]
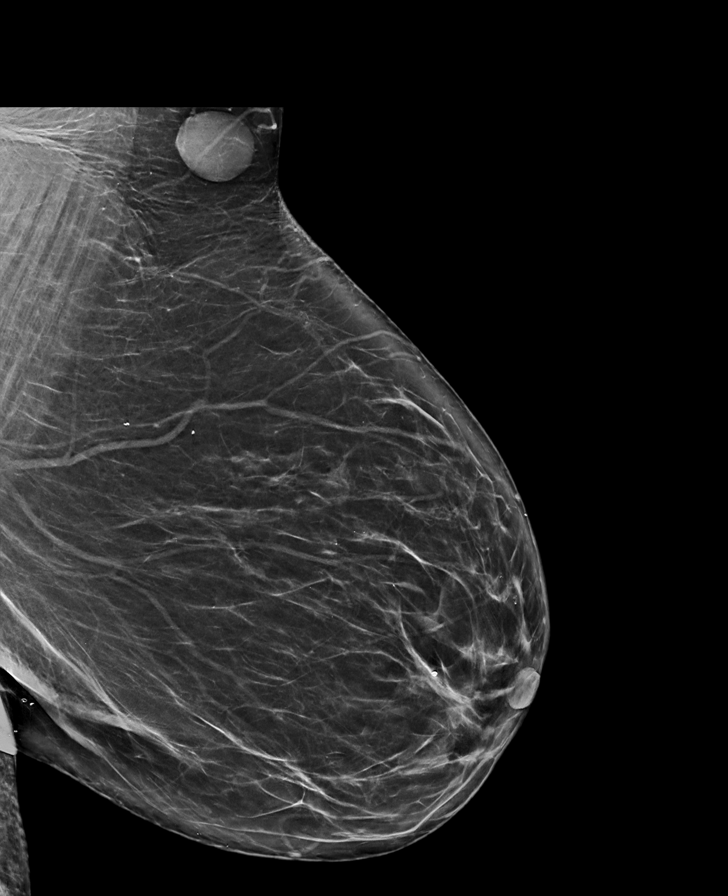

[R CC synth-2D]
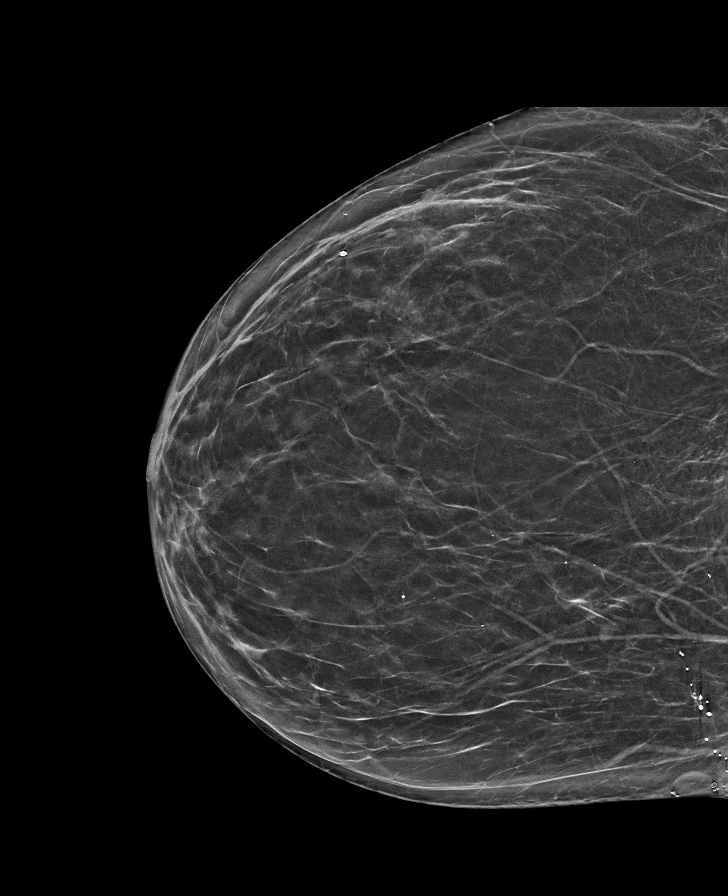

[R CC tomo · tomo slice 33/65.0]
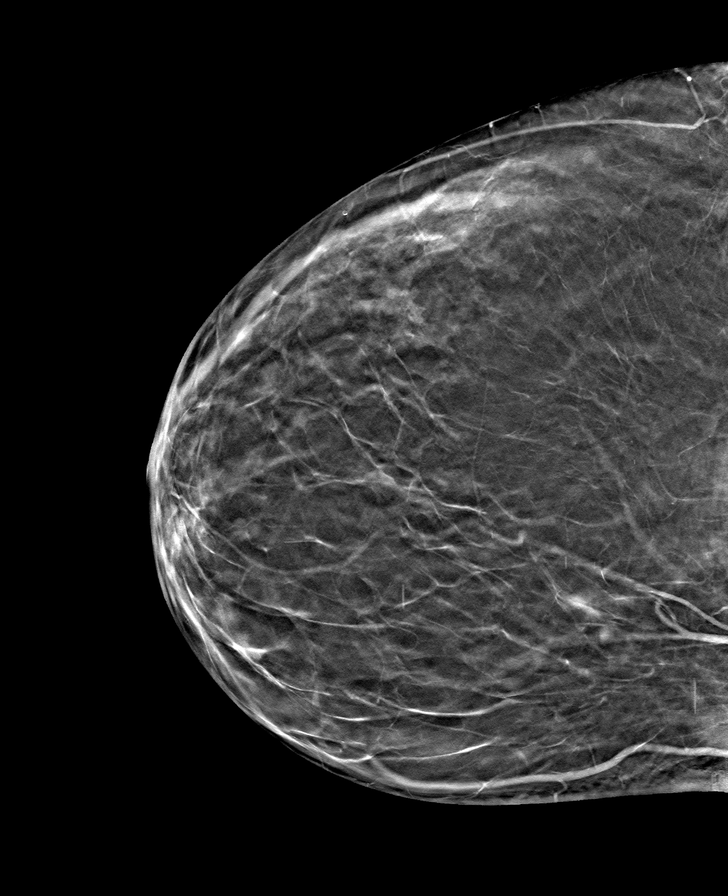

[L CC tomo · tomo slice 33/65.0]
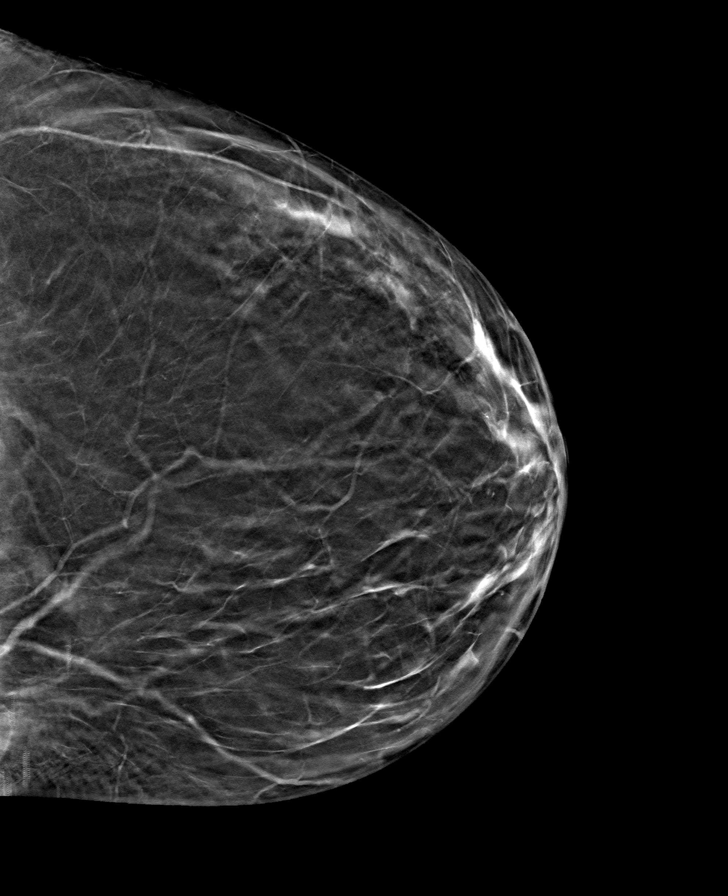

[R MLO tomo · tomo slice 40/79.0]
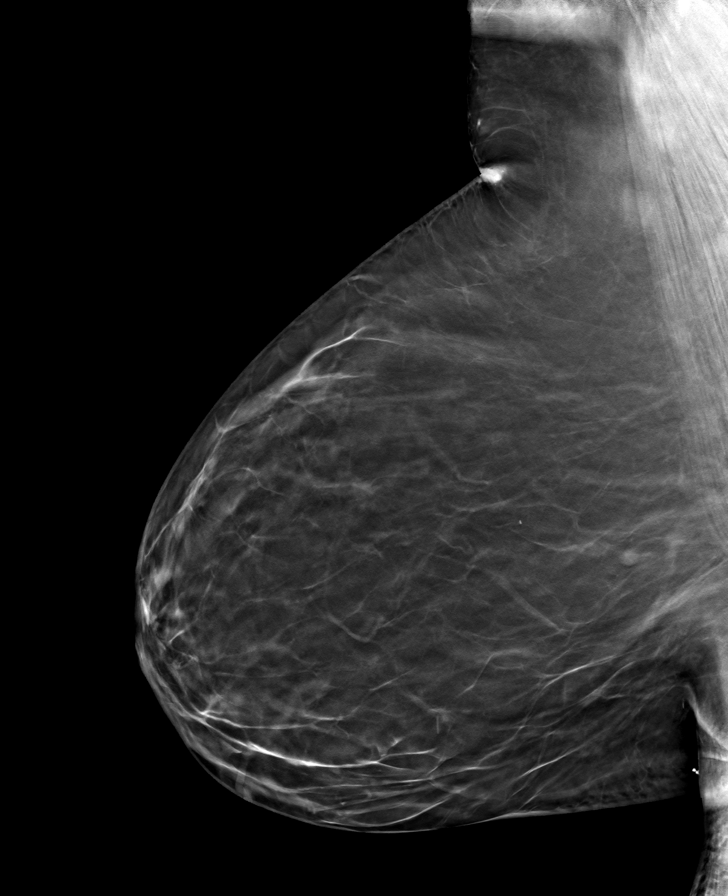

[L MLO tomo · tomo slice 40/79.0]
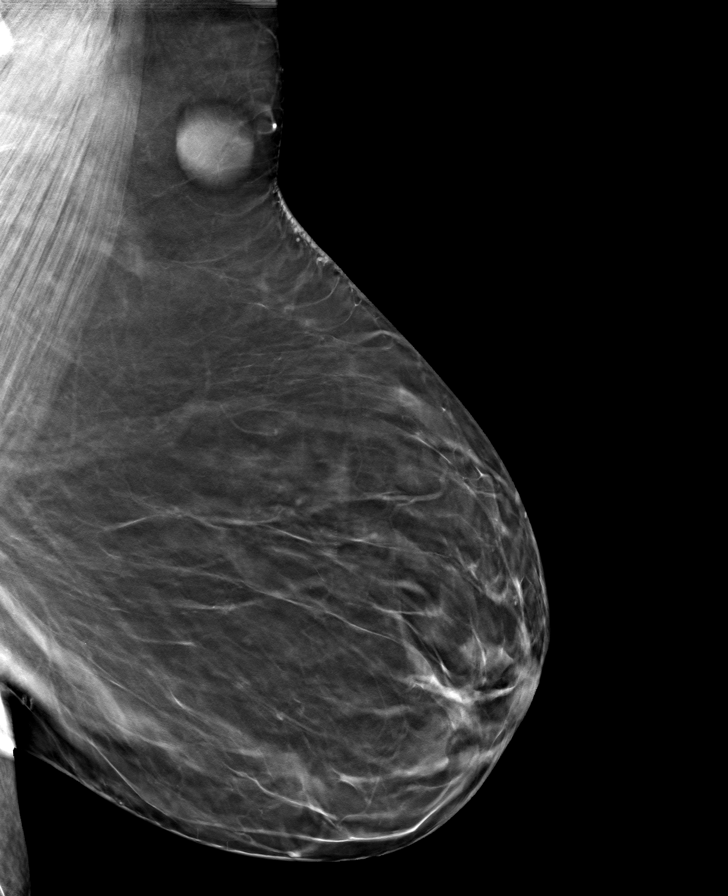

[8 of 24 positions shown; findings below may reference images not displayed]

ACR Breast Density Category b: There are scattered areas of
fibroglandular density.
FINDINGS: There are no findings suspicious for malignancy.
IMPRESSION: No mammographic evidence of malignancy. A result letter of this
screening mammogram will be mailed directly to the patient.

RECOMMENDATION:
Screening mammogram in one year. (Code:51-O-LD2)

BI-RADS CATEGORY  1: Negative.

## 2023-07-26 DIAGNOSIS — E538 Deficiency of other specified B group vitamins: Secondary | ICD-10-CM | POA: Diagnosis not present

## 2023-07-26 DIAGNOSIS — E785 Hyperlipidemia, unspecified: Secondary | ICD-10-CM | POA: Diagnosis not present

## 2023-07-26 DIAGNOSIS — Z124 Encounter for screening for malignant neoplasm of cervix: Secondary | ICD-10-CM | POA: Diagnosis not present

## 2023-07-26 DIAGNOSIS — K219 Gastro-esophageal reflux disease without esophagitis: Secondary | ICD-10-CM | POA: Diagnosis not present

## 2023-07-26 DIAGNOSIS — I129 Hypertensive chronic kidney disease with stage 1 through stage 4 chronic kidney disease, or unspecified chronic kidney disease: Secondary | ICD-10-CM | POA: Diagnosis not present

## 2023-07-26 DIAGNOSIS — Z0000 Encounter for general adult medical examination without abnormal findings: Secondary | ICD-10-CM | POA: Diagnosis not present

## 2023-07-26 DIAGNOSIS — N1832 Chronic kidney disease, stage 3b: Secondary | ICD-10-CM | POA: Diagnosis not present

## 2023-07-26 DIAGNOSIS — E1169 Type 2 diabetes mellitus with other specified complication: Secondary | ICD-10-CM | POA: Diagnosis not present

## 2023-07-26 DIAGNOSIS — E1121 Type 2 diabetes mellitus with diabetic nephropathy: Secondary | ICD-10-CM | POA: Diagnosis not present

## 2023-07-26 DIAGNOSIS — G894 Chronic pain syndrome: Secondary | ICD-10-CM | POA: Diagnosis not present

## 2023-07-26 DIAGNOSIS — E113293 Type 2 diabetes mellitus with mild nonproliferative diabetic retinopathy without macular edema, bilateral: Secondary | ICD-10-CM | POA: Diagnosis not present

## 2023-07-26 DIAGNOSIS — I700 Atherosclerosis of aorta: Secondary | ICD-10-CM | POA: Diagnosis not present

## 2023-07-26 DIAGNOSIS — I739 Peripheral vascular disease, unspecified: Secondary | ICD-10-CM | POA: Diagnosis not present

## 2023-09-07 ENCOUNTER — Encounter (HOSPITAL_COMMUNITY): Payer: Self-pay

## 2023-09-11 DIAGNOSIS — Z860101 Personal history of adenomatous and serrated colon polyps: Secondary | ICD-10-CM | POA: Diagnosis not present

## 2023-09-11 DIAGNOSIS — Z09 Encounter for follow-up examination after completed treatment for conditions other than malignant neoplasm: Secondary | ICD-10-CM | POA: Diagnosis not present

## 2023-09-11 DIAGNOSIS — D175 Benign lipomatous neoplasm of intra-abdominal organs: Secondary | ICD-10-CM | POA: Diagnosis not present

## 2023-09-11 DIAGNOSIS — K5730 Diverticulosis of large intestine without perforation or abscess without bleeding: Secondary | ICD-10-CM | POA: Diagnosis not present

## 2023-09-14 ENCOUNTER — Ambulatory Visit
Admission: RE | Admit: 2023-09-14 | Discharge: 2023-09-14 | Disposition: A | Discharge status: Home / Self Care | Source: Ambulatory Visit | Attending: Family Medicine | Admitting: Family Medicine

## 2023-09-14 DIAGNOSIS — Z1231 Encounter for screening mammogram for malignant neoplasm of breast: Secondary | ICD-10-CM | POA: Diagnosis not present

## 2023-09-19 DIAGNOSIS — H2513 Age-related nuclear cataract, bilateral: Secondary | ICD-10-CM | POA: Diagnosis not present

## 2023-09-19 DIAGNOSIS — E113293 Type 2 diabetes mellitus with mild nonproliferative diabetic retinopathy without macular edema, bilateral: Secondary | ICD-10-CM | POA: Diagnosis not present

## 2023-09-19 DIAGNOSIS — H40033 Anatomical narrow angle, bilateral: Secondary | ICD-10-CM | POA: Diagnosis not present

## 2023-09-19 DIAGNOSIS — H40023 Open angle with borderline findings, high risk, bilateral: Secondary | ICD-10-CM | POA: Diagnosis not present

## 2023-10-29 DIAGNOSIS — E1122 Type 2 diabetes mellitus with diabetic chronic kidney disease: Secondary | ICD-10-CM | POA: Diagnosis not present

## 2023-10-29 DIAGNOSIS — N1832 Chronic kidney disease, stage 3b: Secondary | ICD-10-CM | POA: Diagnosis not present

## 2023-10-29 DIAGNOSIS — I129 Hypertensive chronic kidney disease with stage 1 through stage 4 chronic kidney disease, or unspecified chronic kidney disease: Secondary | ICD-10-CM | POA: Diagnosis not present

## 2023-10-29 DIAGNOSIS — H40033 Anatomical narrow angle, bilateral: Secondary | ICD-10-CM | POA: Diagnosis not present

## 2023-10-29 DIAGNOSIS — R809 Proteinuria, unspecified: Secondary | ICD-10-CM | POA: Diagnosis not present

## 2023-11-19 DIAGNOSIS — H40033 Anatomical narrow angle, bilateral: Secondary | ICD-10-CM | POA: Diagnosis not present

## 2023-12-04 DIAGNOSIS — E113293 Type 2 diabetes mellitus with mild nonproliferative diabetic retinopathy without macular edema, bilateral: Secondary | ICD-10-CM | POA: Diagnosis not present

## 2023-12-04 DIAGNOSIS — H2513 Age-related nuclear cataract, bilateral: Secondary | ICD-10-CM | POA: Diagnosis not present

## 2023-12-04 DIAGNOSIS — H40033 Anatomical narrow angle, bilateral: Secondary | ICD-10-CM | POA: Diagnosis not present

## 2023-12-04 DIAGNOSIS — H40023 Open angle with borderline findings, high risk, bilateral: Secondary | ICD-10-CM | POA: Diagnosis not present

## 2023-12-20 ENCOUNTER — Other Ambulatory Visit: Payer: Self-pay | Admitting: Vascular Surgery

## 2023-12-20 DIAGNOSIS — I70262 Atherosclerosis of native arteries of extremities with gangrene, left leg: Secondary | ICD-10-CM

## 2023-12-20 DIAGNOSIS — I739 Peripheral vascular disease, unspecified: Secondary | ICD-10-CM

## 2024-01-11 DIAGNOSIS — I739 Peripheral vascular disease, unspecified: Secondary | ICD-10-CM | POA: Diagnosis not present

## 2024-01-11 DIAGNOSIS — N1832 Chronic kidney disease, stage 3b: Secondary | ICD-10-CM | POA: Diagnosis not present

## 2024-01-11 DIAGNOSIS — G894 Chronic pain syndrome: Secondary | ICD-10-CM | POA: Diagnosis not present

## 2024-01-11 DIAGNOSIS — I129 Hypertensive chronic kidney disease with stage 1 through stage 4 chronic kidney disease, or unspecified chronic kidney disease: Secondary | ICD-10-CM | POA: Diagnosis not present

## 2024-01-11 DIAGNOSIS — E1121 Type 2 diabetes mellitus with diabetic nephropathy: Secondary | ICD-10-CM | POA: Diagnosis not present

## 2024-01-11 DIAGNOSIS — E785 Hyperlipidemia, unspecified: Secondary | ICD-10-CM | POA: Diagnosis not present

## 2024-01-11 DIAGNOSIS — I700 Atherosclerosis of aorta: Secondary | ICD-10-CM | POA: Diagnosis not present

## 2024-01-11 DIAGNOSIS — Z23 Encounter for immunization: Secondary | ICD-10-CM | POA: Diagnosis not present

## 2024-01-23 ENCOUNTER — Encounter: Payer: Self-pay | Admitting: Vascular Surgery

## 2024-01-23 ENCOUNTER — Ambulatory Visit (INDEPENDENT_AMBULATORY_CARE_PROVIDER_SITE_OTHER): Admitting: Physician Assistant

## 2024-01-23 ENCOUNTER — Encounter: Payer: Self-pay | Admitting: Physician Assistant

## 2024-01-23 ENCOUNTER — Ambulatory Visit (HOSPITAL_BASED_OUTPATIENT_CLINIC_OR_DEPARTMENT_OTHER)
Admission: RE | Admit: 2024-01-23 | Discharge: 2024-01-23 | Disposition: A | Discharge status: Home / Self Care | Source: Ambulatory Visit | Attending: Vascular Surgery

## 2024-01-23 ENCOUNTER — Ambulatory Visit (HOSPITAL_COMMUNITY)
Admission: RE | Admit: 2024-01-23 | Discharge: 2024-01-23 | Disposition: A | Discharge status: Home / Self Care | Source: Ambulatory Visit | Attending: Vascular Surgery | Admitting: Vascular Surgery

## 2024-01-23 VITALS — BP 148/82 | HR 62 | Temp 97.7°F | Ht 64.0 in | Wt 205.8 lb

## 2024-01-23 DIAGNOSIS — I739 Peripheral vascular disease, unspecified: Secondary | ICD-10-CM

## 2024-01-23 DIAGNOSIS — I70262 Atherosclerosis of native arteries of extremities with gangrene, left leg: Secondary | ICD-10-CM

## 2024-01-23 LAB — VAS US ABI WITH/WO TBI
Left ABI: 1.01
Right ABI: 0.67

## 2024-01-23 NOTE — Progress Notes (Unsigned)
 Office Note     CC:  follow up Requesting Provider:  Teresa Channel, MD  HPI: [name] Houston is a 68 y.o. (1955/10/14) female who presents for surveillance follow up of PAD. She has a history of left iliofemoral endarterectomy and common femoral to above-knee popliteal artery bypass with PTFE by Dr. Sheree on 12/06/2021. This was done to improve blood flow for a left calf ulceration, which has since healed.   At her last visit she was doing well without any claudication, rest pain or tissue loss. She does have some LLE swelling on prolonged standing. Was encouraged to elevate and wear compression stockings to help with this  Today she reports overall doing well. She denies any real pain on rest or ambulation no tissue loss. Her ulcer remains healed. She continues to have swelling, mostly around her knee. This is improved with elevation and compression stocking use. 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   The pt is on a statin for cholesterol management.    The pt is on an aspirin .  Other AC:  None The pt is on CCB for hypertension.  The pt does have diabetes. Tobacco hx:  former  Past Medical History:  Diagnosis Date   Diabetes mellitus without complication (HCC)    GERD (gastroesophageal reflux disease)    Hiatal hernia 05/01/2013   DG Esophagus    Hyperlipidemia    Hypertension    Kidney disease 05/02/2011    Past Surgical History:  Procedure Laterality Date   ABDOMINAL AORTOGRAM W/LOWER EXTREMITY N/A 11/14/2021   Procedure: ABDOMINAL AORTOGRAM W/LOWER EXTREMITY;  Surgeon: Sheree Penne Bruckner, MD;  Location: [facility] INVASIVE CV LAB;  Service: Cardiovascular;  Laterality: N/A;   BREAST ABSESS DRAINED  7/08   CHOLECYSTECTOMY     ENDARTERECTOMY FEMORAL Left 12/06/2021   Procedure: LEFT COMMON FEMORAL ENDARTERECTOMY;  Surgeon: Sheree Penne Bruckner, MD;  Location: [facility] OR;  Service: Vascular;   Laterality: Left;   FEMORAL-POPLITEAL BYPASS GRAFT Left 12/06/2021   Procedure: LEFT COMMON FEMORAL-BELOW KNEE POPLITEAL ARTERY BYPASS;  Surgeon: Sheree Penne Bruckner, MD;  Location: [facility] OR;  Service: Vascular;  Laterality: Left;   GROIN DEBRIDEMENT Left 12/13/2021   Procedure: QUILLIAN BUSH;  Surgeon: Sheree Penne Bruckner, MD;  Location: [facility] OR;  Service: Vascular;  Laterality: Left;   ORIF TIBIA PLATEAU  3/09    Social History   Socioeconomic History   Marital status: Married    Spouse name: Not on file   Number of children: Not on file   Years of education: Not on file   Highest education level: Not on file  Occupational History   Not on file  Tobacco Use   Smoking status: Former    Current packs/day: 0.50    Types: Cigarettes   Smokeless tobacco: Never  Vaping Use   Vaping status: Never Used  Substance and Sexual Activity   Alcohol use: Yes    Comment: occ   Drug use: No   Sexual activity: Not on file  Other Topics Concern   Not on file  Social History Narrative   Not on file   Social Drivers of Health   Financial Resource Strain: Not on file  Food Insecurity: Not on file  Transportation Needs: Not on file  Physical Activity: Not on file  Stress: Not on file  Social Connections: Not on file  Intimate Partner Violence: Not on file  Family History  Problem Relation Age of Onset   Hypertension Mother    Diabetes Mother    CVA Father    Hypertension Father    Diabetes Father    Pancreatic cancer Father    Pulmonary embolism Father    Bone cancer Maternal Aunt    Diabetes Paternal Grandmother     Current Outpatient Medications  Medication Sig Dispense Refill   acetaminophen  (TYLENOL ) 500 MG tablet Take 1,000 mg by mouth every 6 (six) hours as needed for moderate pain.     amLODipine  (NORVASC ) 10 MG tablet Take 10 mg by mouth daily.     aspirin  81 MG tablet Take 81 mg by mouth daily.     atorvastatin  (LIPITOR) 40 MG tablet Take 40 mg by mouth  daily.     chlorthalidone  (HYGROTON ) 25 MG tablet Take 25 mg by mouth daily.     cholecalciferol (VITAMIN D3) 25 MCG (1000 UNIT) tablet Take 1,000 Units by mouth daily.     Cyanocobalamin  (B-12-SL) 1000 MCG SUBL Place 1,000 mcg under the tongue daily.     dapagliflozin propanediol (FARXIGA) 10 MG TABS tablet Take 10 mg by mouth daily.     famotidine  (PEPCID ) 40 MG tablet Take 40 mg by mouth See admin instructions. Take 40 mg daily, may take a second 40 mg dose as needed for heartburn     glipiZIDE (GLUCOTROL) 5 MG tablet Take 5 mg by mouth 2 (two) times daily.     Magnesium  250 MG TABS Take 250 mg by mouth 2 (two) times daily. (Patient not taking: Reported on 03/14/2023)     oxyCODONE -acetaminophen  (PERCOCET/ROXICET) 5-325 MG tablet Take 1 tablet by mouth every 6 (six) hours as needed for severe pain. 10 tablet 0   pioglitazone (ACTOS) 15 MG tablet Take 15 mg by mouth daily.     potassium chloride  SA (KLOR-CON  M) 20 MEQ tablet Take 20 mEq by mouth daily.     Semaglutide,0.25 or 0.5MG /DOS, (OZEMPIC, 0.25 OR 0.5 MG/DOSE,) 2 MG/3ML SOPN Take 0.5 mg by mouth every Thursday.     No current facility-administered medications for this visit.    Allergies  Allergen Reactions   Morphine  Nausea And Vomiting     REVIEW OF SYSTEMS:  Negative unless noted in HPI [X]  denotes positive finding, [ ]  denotes negative finding Cardiac  Comments:  Chest pain or chest pressure:    Shortness of breath upon exertion:    Short of breath when lying flat:    Irregular heart rhythm:        Vascular    Pain in calf, thigh, or hip brought on by ambulation:    Pain in feet at night that wakes you up from your sleep:     Blood clot in your veins:    Leg swelling:         Pulmonary    Oxygen at home:    Productive cough:     Wheezing:         Neurologic    Sudden weakness in arms or legs:     Sudden numbness in arms or legs:     Sudden onset of difficulty speaking or slurred speech:    Temporary loss of  vision in one eye:     Problems with dizziness:         Gastrointestinal    Blood in stool:     Vomited blood:         Genitourinary    Burning when urinating:  Blood in urine:        Psychiatric    Major depression:         Hematologic    Bleeding problems:    Problems with blood clotting too easily:        Skin    Rashes or ulcers:        Constitutional    Fever or chills:      PHYSICAL EXAMINATION:  Vitals:   01/23/24 1511  BP: (!) 148/82  Pulse: 62  Temp: 97.7 F (36.5 C)     General:  WDWN in NAD; vital signs documented above Gait: Not observed HENT: WNL, normocephalic Pulmonary: normal non-labored breathing Cardiac: regular HR Abdomen: soft, NT Vascular Exam/Pulses: 2+ femoral pulses, 2+ left DP, right PT 1+ Extremities: without ischemic changes, without Gangrene , without cellulitis; without open wounds;  Musculoskeletal: no muscle wasting or atrophy  Neurologic: A&O X 3 Psychiatric:  The pt has Normal affect.   Non-Invasive Vascular Imaging:   +-------+-----------+-----------+------------+------------+  ABI/TBIToday's ABIToday's TBIPrevious ABIPrevious TBI  +-------+-----------+-----------+------------+------------+  Right 0.67       0.29       0.69        0.52          +-------+-----------+-----------+------------+------------+  Left  1.01       0.50       1.22        0.48          +-------+-----------+-----------+------------+------------+   VAS US  Lower extremity bypass graft: +-----------+--------+-----+--------+----------+--------+  LEFT      PSV cm/sRatioStenosisWaveform  Comments  +-----------+--------+-----+--------+----------+--------+  POP Mid    62                   biphasic            +-----------+--------+-----+--------+----------+--------+  ATA Distal 26                   monophasic          +-----------+--------+-----+--------+----------+--------+  PTA Distal                                 NV        +-----------+--------+-----+--------+----------+--------+  PERO Distal                               NV        +-----------+--------+-----+--------+----------+--------+     Left Graft #1: CFA to AK pop  +--------------------+--------+--------+----------+--------+                     PSV cm/sStenosisWaveform  Comments  +--------------------+--------+--------+----------+--------+  Inflow             107             biphasic            +--------------------+--------+--------+----------+--------+  Proximal Anastomosis109             triphasic           +--------------------+--------+--------+----------+--------+  Proximal Graft      65              biphasic            +--------------------+--------+--------+----------+--------+  Mid Graft           43              biphasic  broad     +--------------------+--------+--------+----------+--------+  Distal Graft        38              monophasic          +--------------------+--------+--------+----------+--------+  Distal Anastomosis  82              monophasic          +--------------------+--------+--------+----------+--------+  Outflow            139             monophasic          +--------------------+--------+--------+----------+--------+   Summary:  Left: Patent graft with no visualized stenosis. Limited visualization of the distal graft and anastomosis. Low flow noted within mid to distal graft.   ASSESSMENT/PLAN:: 68 y.o. female here for surveillance follow up of PAD. She has a history of left iliofemoral endarterectomy and common femoral to above-knee popliteal artery bypass with PTFE by Dr. Sheree on 12/06/2021. This was done to improve blood flow for a left calf ulceration, which has since healed. She is without any claudication, rest pain or tissue loss. She does have swelling mostly around left knee that is uncomfortable for her.  - Duplex shows patent bypass  graft without stenosis. There are some lower velocities in the mid to distal graft however this area per tech was difficult to examine due to depth of bypass graft. Patient really not eager to undergo any further procedures. We discussed Angiogram and looking at this area vs closer interval follow up. She would like to hold off on intervention and follow up in 6 months with repeat non invasive studies. - ABI shows stable ABI bilaterally, slight decrease in right TBI - continue elevation and compression PRN - Encourage exercise regimen - continue to elevate/ sit while working -recommend closer interval follow up in 6 months with ABI and RLE bypass graft duplex - She knows to call for earlier follow up if new or worsening symptoms   Teretha Damme, PA-C [facility] [phone]  Clinic MD:   Sheree

## 2024-01-24 ENCOUNTER — Encounter: Payer: Self-pay | Admitting: Physician Assistant

## 2024-03-21 DIAGNOSIS — H40033 Anatomical narrow angle, bilateral: Secondary | ICD-10-CM | POA: Diagnosis not present

## 2024-03-21 DIAGNOSIS — H40023 Open angle with borderline findings, high risk, bilateral: Secondary | ICD-10-CM | POA: Diagnosis not present

## 2024-03-21 DIAGNOSIS — H2513 Age-related nuclear cataract, bilateral: Secondary | ICD-10-CM | POA: Diagnosis not present

## 2024-03-21 DIAGNOSIS — E113393 Type 2 diabetes mellitus with moderate nonproliferative diabetic retinopathy without macular edema, bilateral: Secondary | ICD-10-CM | POA: Diagnosis not present

## 2024-07-23 ENCOUNTER — Encounter (HOSPITAL_COMMUNITY)

## 2024-07-23 ENCOUNTER — Ambulatory Visit

## 2024-07-23 ENCOUNTER — Other Ambulatory Visit (HOSPITAL_COMMUNITY)
